# Patient Record
Sex: Female | Born: 1990 | Race: Black or African American | Hispanic: No | Marital: Single | Smoking: Current some day smoker
Health system: Southern US, Community
[De-identification: ages and names within clinical notes are randomized; demographics above are authoritative.]

## PROBLEM LIST (undated history)

## (undated) DIAGNOSIS — O139 Gestational [pregnancy-induced] hypertension without significant proteinuria, unspecified trimester: Secondary | ICD-10-CM

## (undated) DIAGNOSIS — D649 Anemia, unspecified: Secondary | ICD-10-CM

## (undated) DIAGNOSIS — J45909 Unspecified asthma, uncomplicated: Secondary | ICD-10-CM

## (undated) DIAGNOSIS — N390 Urinary tract infection, site not specified: Secondary | ICD-10-CM

## (undated) DIAGNOSIS — Z5189 Encounter for other specified aftercare: Secondary | ICD-10-CM

## (undated) DIAGNOSIS — F319 Bipolar disorder, unspecified: Secondary | ICD-10-CM

## (undated) DIAGNOSIS — R87629 Unspecified abnormal cytological findings in specimens from vagina: Secondary | ICD-10-CM

## (undated) DIAGNOSIS — G935 Compression of brain: Secondary | ICD-10-CM

## (undated) DIAGNOSIS — D573 Sickle-cell trait: Secondary | ICD-10-CM

## (undated) DIAGNOSIS — R011 Cardiac murmur, unspecified: Secondary | ICD-10-CM

## (undated) DIAGNOSIS — Z8759 Personal history of other complications of pregnancy, childbirth and the puerperium: Secondary | ICD-10-CM

## (undated) HISTORY — PX: CERVICAL BIOPSY  W/ LOOP ELECTRODE EXCISION: SUR135

## (undated) HISTORY — DX: Gestational (pregnancy-induced) hypertension without significant proteinuria, unspecified trimester: O13.9

## (undated) HISTORY — DX: Unspecified abnormal cytological findings in specimens from vagina: R87.629

## (undated) HISTORY — PX: DILATION AND CURETTAGE OF UTERUS: SHX78

## (undated) HISTORY — PX: CERVICAL CERCLAGE: SHX1329

---

## 2012-11-23 DIAGNOSIS — O1490 Unspecified pre-eclampsia, unspecified trimester: Secondary | ICD-10-CM

## 2012-11-23 DIAGNOSIS — Z9889 Other specified postprocedural states: Secondary | ICD-10-CM

## 2019-03-14 DIAGNOSIS — Z86711 Personal history of pulmonary embolism: Secondary | ICD-10-CM

## 2019-03-14 HISTORY — DX: Personal history of pulmonary embolism: Z86.711

## 2019-05-23 DIAGNOSIS — I10 Essential (primary) hypertension: Secondary | ICD-10-CM | POA: Insufficient documentation

## 2019-07-03 DIAGNOSIS — D509 Iron deficiency anemia, unspecified: Secondary | ICD-10-CM | POA: Insufficient documentation

## 2019-08-13 DIAGNOSIS — O3430 Maternal care for cervical incompetence, unspecified trimester: Secondary | ICD-10-CM

## 2019-08-13 DIAGNOSIS — O1490 Unspecified pre-eclampsia, unspecified trimester: Secondary | ICD-10-CM

## 2019-08-15 DIAGNOSIS — O339 Maternal care for disproportion, unspecified: Secondary | ICD-10-CM | POA: Insufficient documentation

## 2021-03-22 ENCOUNTER — Encounter (HOSPITAL_COMMUNITY): Payer: Self-pay

## 2021-03-22 ENCOUNTER — Other Ambulatory Visit: Payer: Self-pay

## 2021-03-22 ENCOUNTER — Emergency Department (HOSPITAL_COMMUNITY)
Admission: EM | Admit: 2021-03-22 | Discharge: 2021-03-22 | Discharge status: Against Medical Advice | Payer: No Typology Code available for payment source | Attending: Physician Assistant | Admitting: Physician Assistant

## 2021-03-22 DIAGNOSIS — N9489 Other specified conditions associated with female genital organs and menstrual cycle: Secondary | ICD-10-CM | POA: Insufficient documentation

## 2021-03-22 DIAGNOSIS — N938 Other specified abnormal uterine and vaginal bleeding: Secondary | ICD-10-CM | POA: Diagnosis present

## 2021-03-22 DIAGNOSIS — Z5321 Procedure and treatment not carried out due to patient leaving prior to being seen by health care provider: Secondary | ICD-10-CM | POA: Insufficient documentation

## 2021-03-22 HISTORY — DX: Bipolar disorder, unspecified: F31.9

## 2021-03-22 LAB — I-STAT BETA HCG BLOOD, ED (MC, WL, AP ONLY): I-stat hCG, quantitative: <5 m[IU]/mL (ref <5)

## 2021-03-22 NOTE — ED Triage Notes (Signed)
Pt arrives POV for eval of vaginal bleeding w/ clots onset yesterday. Pt reports LMP 11/17, +UPT at [facility] in December, scheduled for abortion d/t taking depakote. States started w/ cramping, vag bleeding yesterday.

## 2021-03-22 NOTE — ED Provider Triage Note (Signed)
Emergency Medicine Provider Triage Evaluation Note  Rosealee Recinos , a 31 y.o. female  was evaluated in triage.  Pt complains of vaginal bleeding. Reports positive preg test at home in December. Sent here from [place] to r/o ectopic.  Review of Systems  Positive: Vaginal bleeding, pelvic cramping Negative: fever  Physical Exam  BP (!) 144/87 (BP Location: Left Arm)    Pulse 72    Temp 99.7 F (37.6 C) (Oral)    Resp 16    SpO2 100%  Gen:   Awake, no distress   Resp:  Normal effort  MSK:   Moves extremities without difficulty  Other:  No pelvic TTP  Medical Decision Making  Medically screening exam initiated at 4:00 PM.  Appropriate orders placed.  Ronesha Heenan was informed that the remainder of the evaluation will be completed by another provider, this initial triage assessment does not replace that evaluation, and the importance of remaining in the ED until their evaluation is complete.     Karrie Meres, PA-C 03/22/21 1605

## 2021-03-22 NOTE — ED Notes (Signed)
iStat bhCG was <5.0 Triage RN made aware.   iStats not currently crossing over.

## 2021-03-22 NOTE — ED Notes (Signed)
Pt stated that she was unhappy with how she was being treated here and does not want to wait 17 hours to be seen. Pt decided to leave.

## 2021-04-08 LAB — OB RESULTS CONSOLE HGB/HCT, BLOOD
HCT: 41 (ref 29–41)
Hemoglobin: 5.3

## 2021-04-08 LAB — OB RESULTS CONSOLE PLATELET COUNT: Platelets: 369

## 2021-05-02 ENCOUNTER — Encounter: Payer: Self-pay | Admitting: Emergency Medicine

## 2021-05-02 ENCOUNTER — Ambulatory Visit
Admission: EM | Admit: 2021-05-02 | Discharge: 2021-05-02 | Disposition: A | Discharge status: Home / Self Care | Payer: No Typology Code available for payment source

## 2021-05-02 ENCOUNTER — Other Ambulatory Visit: Payer: Self-pay

## 2021-05-02 ENCOUNTER — Ambulatory Visit (INDEPENDENT_AMBULATORY_CARE_PROVIDER_SITE_OTHER): Payer: No Typology Code available for payment source

## 2021-05-02 DIAGNOSIS — J069 Acute upper respiratory infection, unspecified: Secondary | ICD-10-CM | POA: Diagnosis not present

## 2021-05-02 DIAGNOSIS — R059 Cough, unspecified: Secondary | ICD-10-CM

## 2021-05-02 DIAGNOSIS — R051 Acute cough: Secondary | ICD-10-CM

## 2021-05-02 MED ORDER — BENZONATATE 100 MG PO CAPS: 100.0000 mg | ORAL_CAPSULE | Freq: Three times a day (TID) | ORAL | 0 refills | Status: DC | PRN

## 2021-05-02 MED ORDER — PREDNISONE 10 MG (21) PO TBPK: ORAL_TABLET | Freq: Every day | ORAL | 0 refills | Status: DC

## 2021-05-02 MED ORDER — AZITHROMYCIN 500 MG PO TABS: ORAL_TABLET | ORAL | 0 refills | Status: AC

## 2021-05-02 NOTE — ED Triage Notes (Signed)
Patient c/o productive cough x 2 weeks, congestion, some chest discomfort.  Patient concern for pneumonia.

## 2021-05-02 NOTE — Discharge Instructions (Signed)
Your chest x-ray was normal.  You have been prescribed 3 medications to alleviate symptoms.  Please follow-up if symptoms persist or worsen.

## 2021-05-02 NOTE — ED Provider Notes (Signed)
[facility]    CSN: [id number] Arrival date & time: 05/02/21  1013      History   Chief Complaint Chief Complaint  Patient presents with   Cough    HPI Kimberly Stark is a 31 y.o. female.   Patient presents with 2-week history of productive cough and nasal congestion.  Patient does reports that she has a history of asthma and has been having some intermittent shortness of breath.  Denies chest pain, sore throat, ear pain, nausea, vomiting, diarrhea, abdominal pain.  She has been having to use her albuterol inhaler more often while being sick.  Denies any known sick contacts or fevers.  Patient has taken over-the-counter cough cold medication with minimal improvement in symptoms.   Cough  Past Medical History:  Diagnosis Date   Bipolar 1 disorder (HCC)     There are no problems to display for this patient.   History reviewed. No pertinent surgical history.  OB History   No obstetric history on file.      Home Medications    Prior to Admission medications   Medication Sig Start Date End Date Taking? Authorizing Provider  azithromycin (ZITHROMAX) 500 MG tablet Take 1 tablet (500 mg total) by mouth daily for 1 day, THEN 0.5 tablets (250 mg total) daily for 4 days. 05/02/21 05/07/21 Yes Indy Prestwood, Acie Fredrickson, FNP  benzonatate (TESSALON) 100 MG capsule Take 1 capsule (100 mg total) by mouth every 8 (eight) hours as needed for cough. 05/02/21  Yes Victormanuel Mclure, Rolly Salter E, FNP  Cholecalciferol (VITAMIN D3 PO) Take by mouth.   Yes [provider]  Divalproex Sodium (DEPAKOTE PO) Take by mouth.   Yes [provider]  ferrous sulfate 325 (65 FE) MG EC tablet Take 325 mg by mouth 3 (three) times daily with meals.   Yes [provider]  predniSONE (STERAPRED UNI-PAK 21 TAB) 10 MG (21) TBPK tablet Take by mouth daily. Take 6 tabs by mouth daily  for 2 days, then 5 tabs for 2 days, then 4 tabs for 2 days, then 3 tabs for 2 days, 2 tabs for 2 days, then 1 tab by  mouth daily for 2 days 05/02/21  Yes Ambre Kobayashi, Acie Fredrickson, FNP    Family History Family History  Problem Relation Age of Onset   Healthy Mother     Social History Social History   Tobacco Use   Smoking status: Every Day    Packs/day: 0.50    Types: Cigarettes   Smokeless tobacco: Never  Substance Use Topics   Alcohol use: Not Currently   Drug use: Not Currently     Allergies   Iodine, Latex, Penicillins, and Shellfish allergy   Review of Systems Review of Systems Per HPI  Physical Exam Triage Vital Signs ED Triage Vitals  Enc Vitals Group     BP 05/02/21 1055 118/72     Pulse Rate 05/02/21 1055 69     Resp 05/02/21 1055 18     Temp 05/02/21 1055 98.2 F (36.8 C)     Temp Source 05/02/21 1055 Oral     SpO2 05/02/21 1055 98 %     Weight 05/02/21 1057 164 lb (74.4 kg)     Height 05/02/21 1057 5\' 4"  (1.626 m)     Head Circumference --      Peak Flow --      Pain Score 05/02/21 1056 3     Pain Loc --      Pain Edu? --  Excl. in GC? --    No data found.  Updated Vital Signs BP 118/72 (BP Location: Left Arm)    Pulse 69    Temp 98.2 F (36.8 C) (Oral)    Resp 18    Ht 5\' 4"  (1.626 m)    Wt 164 lb (74.4 kg)    LMP 04/14/2021    SpO2 98%    BMI 28.15 kg/m   Visual Acuity Right Eye Distance:   Left Eye Distance:   Bilateral Distance:    Right Eye Near:   Left Eye Near:    Bilateral Near:     Physical Exam Constitutional:      General: She is not in acute distress.    Appearance: Normal appearance. She is not toxic-appearing or diaphoretic.  HENT:     Head: Normocephalic and atraumatic.     Right Ear: Tympanic membrane and ear canal normal.     Left Ear: Tympanic membrane and ear canal normal.     Nose: Congestion present.     Mouth/Throat:     Mouth: Mucous membranes are moist.     Pharynx: No posterior oropharyngeal erythema.  Eyes:     Extraocular Movements: Extraocular movements intact.     Conjunctiva/sclera: Conjunctivae normal.     Pupils:  Pupils are equal, round, and reactive to light.  Cardiovascular:     Rate and Rhythm: Normal rate and regular rhythm.     Pulses: Normal pulses.     Heart sounds: Normal heart sounds.  Pulmonary:     Effort: Pulmonary effort is normal. No respiratory distress.     Breath sounds: Normal breath sounds. No stridor. No wheezing, rhonchi or rales.  Abdominal:     General: Abdomen is flat. Bowel sounds are normal.     Palpations: Abdomen is soft.  Musculoskeletal:        General: Normal range of motion.     Cervical back: Normal range of motion.  Skin:    General: Skin is warm and dry.  Neurological:     General: No focal deficit present.     Mental Status: She is alert and oriented to person, place, and time. Mental status is at baseline.  Psychiatric:        Mood and Affect: Mood normal.        Behavior: Behavior normal.     UC Treatments / Results  Labs (all labs ordered are listed, but only abnormal results are displayed) Labs Reviewed - No data to display  EKG   Radiology DG Chest 2 View  Result Date: 05/02/2021 CLINICAL DATA:  Productive cough for 2 weeks EXAM: CHEST - 2 VIEW COMPARISON:  None. FINDINGS: The heart size and mediastinal contours are within normal limits. Both lungs are clear. The visualized skeletal structures are unremarkable. IMPRESSION: No active cardiopulmonary disease. Electronically Signed   By: 05/04/2021 D.O.   On: 05/02/2021 11:53    Procedures Procedures (including critical care time)  Medications Ordered in UC Medications - No data to display  Initial Impression / Assessment and Plan / UC Course  I have reviewed the triage vital signs and the nursing notes.  Pertinent labs & imaging results that were available during my care of the patient were reviewed by me and considered in my medical decision making (see chart for details).     Chest x-ray was negative for any acute cardiopulmonary process.  Will cover with azithromycin for  atypicals.  Prednisone prescribed to help alleviate inflammation  associated with possible asthma flareup.  Benzonatate prescribed to take as needed for cough.  Do not think that viral testing is necessary given length of symptoms.  Discussed return precautions.  Patient verbalized understanding and was agreeable with plan. Final Clinical Impressions(s) / UC Diagnoses   Final diagnoses:  Acute upper respiratory infection  Acute cough     Discharge Instructions      Your chest x-ray was normal.  You have been prescribed 3 medications to alleviate symptoms.  Please follow-up if symptoms persist or worsen.     ED Prescriptions     Medication Sig Dispense Auth. Provider   azithromycin (ZITHROMAX) 500 MG tablet Take 1 tablet (500 mg total) by mouth daily for 1 day, THEN 0.5 tablets (250 mg total) daily for 4 days. 3 tablet [place], [place] E, [place]   predniSONE (STERAPRED UNI-PAK 21 TAB) 10 MG (21) TBPK tablet Take by mouth daily. Take 6 tabs by mouth daily  for 2 days, then 5 tabs for 2 days, then 4 tabs for 2 days, then 3 tabs for 2 days, 2 tabs for 2 days, then 1 tab by mouth daily for 2 days 42 tablet Justina Bertini, [place] E, [place]   benzonatate (TESSALON) 100 MG capsule Take 1 capsule (100 mg total) by mouth every 8 (eight) hours as needed for cough. 21 capsule [place], Acie Fredrickson, [place]      PDMP not reviewed this encounter.   Gustavus Bryant, [place] 05/02/21 [phone]

## 2021-05-25 ENCOUNTER — Telehealth: Payer: Self-pay | Admitting: Family Medicine

## 2021-05-25 NOTE — Telephone Encounter (Signed)
Patient was referred and I use phone number in referral packet to reach out. Was unable to get in contact, left voicemail to call back and schedule. ?

## 2021-06-09 ENCOUNTER — Telehealth (INDEPENDENT_AMBULATORY_CARE_PROVIDER_SITE_OTHER): Payer: No Typology Code available for payment source

## 2021-06-09 DIAGNOSIS — O09899 Supervision of other high risk pregnancies, unspecified trimester: Secondary | ICD-10-CM

## 2021-06-09 DIAGNOSIS — O09299 Supervision of pregnancy with other poor reproductive or obstetric history, unspecified trimester: Secondary | ICD-10-CM | POA: Insufficient documentation

## 2021-06-09 DIAGNOSIS — O0940 Supervision of pregnancy with grand multiparity, unspecified trimester: Secondary | ICD-10-CM

## 2021-06-09 DIAGNOSIS — O1490 Unspecified pre-eclampsia, unspecified trimester: Secondary | ICD-10-CM | POA: Insufficient documentation

## 2021-06-09 DIAGNOSIS — O0990 Supervision of high risk pregnancy, unspecified, unspecified trimester: Secondary | ICD-10-CM | POA: Insufficient documentation

## 2021-06-09 DIAGNOSIS — Z9889 Other specified postprocedural states: Secondary | ICD-10-CM | POA: Insufficient documentation

## 2021-06-09 DIAGNOSIS — O09293 Supervision of pregnancy with other poor reproductive or obstetric history, third trimester: Secondary | ICD-10-CM | POA: Insufficient documentation

## 2021-06-09 DIAGNOSIS — Z3A00 Weeks of gestation of pregnancy not specified: Secondary | ICD-10-CM

## 2021-06-09 NOTE — Patient Instructions (Signed)

## 2021-06-09 NOTE — Progress Notes (Signed)
New OB Intake ? ?I connected with  [name] on 06/09/21 at  1:15 PM EDT by MyChart Video Visit and verified that I am speaking with the correct person using two identifiers. Nurse is located at [facility] and pt is located at [email or web address]. ? ?I discussed the limitations, risks, security and privacy concerns of performing an evaluation and management service by telephone and the availability of in person appointments. I also discussed with the patient that there may be a patient responsible charge related to this service. The patient expressed understanding and agreed to proceed. ? ?I explained I am completing New OB Intake today. We discussed her EDD of 12/27/21 that is based on LMP of 03/22/21. Pt is G11/P2. I reviewed her allergies, medications, Medical/Surgical/OB history, and appropriate screenings. I informed her of [facility] services. Based on history, this is a/an  pregnancy complicated by pre-eclampsia .  ? ?Patient Active Problem List  ? Diagnosis Date Noted  ? Preeclampsia 06/09/2021  ? Supervision of high risk pregnancy, antepartum 06/09/2021  ? History of cervical cerclage 06/09/2021  ? ? ?Concerns addressed today ? ?Delivery Plans:  ?Plans to deliver at [facility].  ? ?MyChart/Babyscripts ?MyChart access verified. I explained pt will have some visits in office and some virtually. Babyscripts instructions given and order placed. Patient verifies receipt of registration text/e-mail. Account successfully created and app downloaded. ? ?Blood Pressure Cuff  ?Pt has her own BP Cuff, Explained after first prenatal appt pt will check weekly and document in Babyscripts. ? ?Weight scale: Patient does / does not  have weight scale. Weight scale ordered for patient to pick up from [facility].  ? ?Anatomy US ?Explained first scheduled [place] will be around 19 weeks. Anatomy US scheduled for 08/02/21 at 0945. Pt notified to arrive at 0930. ?Scheduled AFP lab only appointment if CenteringPregnancy pt for same day as anatomy US.   ? ?Labs ?Discussed Avelina Laine genetic screening with patient. Would like both Panorama and Horizon drawn at new OB visit.Also if interested in genetic testing, tell patient she will need AFP 15-21 weeks to complete genetic testing .Routine prenatal labs needed. ? ?Covid Vaccine ?Patient has covid vaccine.  ? ?Is patient a CenteringPregnancy candidate? Not a candidate  "Centering Patient" indicated on sticky note ?  ?Is patient a Mom+Baby Combined Care candidate? Not a candidate   Scheduled with Mom+Baby provider  ?  ?Is patient interested in [place]? No  "Interested in [employer] - Schedule next visit with CNM" on sticky note ? ?Informed patient of [facility] website  and placed link in her AVS.  ? ?Social Determinants of Health ?Food Insecurity: Patient denies food insecurity. ?WIC Referral: Patient is interested in referral to [facility].  ?Transportation: Patient denies transportation needs. ?Childcare: Discussed no children allowed at ultrasound appointments. Offered childcare services; patient declines childcare services at this time. ? ?Send link to Pregnancy Navigators ? ? ?Placed OB Box on problem list and updated ? ?First visit review ?I reviewed new OB appt with pt. I explained she will have a pelvic exam, ob bloodwork with genetic screening, and PAP smear. Explained pt will be seen by Dr. Para March at first visit; encounter routed to appropriate provider. Explained that patient will be seen by pregnancy navigator following visit with provider. [facility] information placed in AVS.  ? ?Henrietta Dine, CMA ?06/09/2021  2:16 PM  ?

## 2021-06-20 NOTE — Progress Notes (Signed)
? ?History:  ? Kimberly Stark is a 31 y.o. [id number] at 7164w1d by LMP being seen today for her first obstetrical visit.    ? ?Patient does intend to breast feed.  ? ?Pregnancy history fully reviewed. Obstetrical history is significant for:  ?History of preterm birth and cerclage. She had a history of 2 LEEP procedures - one in 2008 and one in 2012. She was then pregnant and bleeding and went in for evaluation and had been told she was dilated. She had a rescue cerclage placed and ultimately was delivered at 30w for PreE.  ?She also has history of Preeclampsia at 30wks in her pregnancy in 2014 and was induced. Her baby was "less than 6lbs". She also developed likely HELLP syndrome with her second pregnancy.  ?She had a c-section in 2021 because she was pushing and could not get the baby out and they were concerned she was developing HELLP Syndrome.  ?H/o PE? She is not sure but in 2021 she was hospitalized with COVID during her pregnancy and subsequently placed on Lovenox BID for the duration of her pregnancy. She is unsure where she had clots but was told she has them.  ?She has a history of RPL - she was going to start work up for APS and decided not to. Her mom DOES have a history of APS.  ? ?Patient reports no complaints. ?  ? ?  ?HISTORY: ?OB History  ?Gravida Para Term Preterm AB Living  ?11 1 0 1 8 2   ?SAB IAB Ectopic Multiple Live Births  ?7 1 0 0 2  ?  ?# Outcome Date GA Lbr Len/2nd Weight Sex Delivery Anes PTL Lv  ?11 Current           ?10 Gravida 08/13/19   9 lb 5.1 oz (4.227 kg)  CS-LTranv  Y LIV  ?   Complications: Preeclampsia, Cervical cerclage suture present, unspecified trimester  ?9 Preterm 11/23/12 7848w0d  5 lb 8.2 oz (2.5 kg)  Vag-Spont   LIV  ?   Complications: Preeclampsia, History of cervical cerclage  ?8 IAB           ?7 SAB           ?6 SAB           ?5 SAB           ?4 SAB           ?3 SAB           ?2 SAB           ?1 SAB           ?  ? ?No results found for: DIAGPAP, HPV, HPVHIGH ? ? ?Past  Medical History:  ?Diagnosis Date  ? Bipolar 1 disorder (HCC)   ? Pregnancy induced hypertension   ? Preterm labor   ? ?Past Surgical History:  ?Procedure Laterality Date  ? DILATION AND CURETTAGE OF UTERUS    ? ?Family History  ?Problem Relation Age of Onset  ? Hypertension Mother   ? Diabetes Mother   ? Healthy Mother   ? Diabetes Father   ? Cancer Maternal Grandmother   ? Diabetes Paternal Grandmother   ? ?Social History  ? ?Tobacco Use  ? Smoking status: Every Day  ?  Packs/day: 0.50  ?  Types: Cigarettes  ? Smokeless tobacco: Never  ?Substance Use Topics  ? Alcohol use: Not Currently  ? Drug use: Not Currently  ? ?Allergies  ?Allergen Reactions  ?  Iodine   ? Latex   ? Penicillins   ? Shellfish Allergy   ? ?Current Outpatient Medications on File Prior to Visit  ?Medication Sig Dispense Refill  ? Cholecalciferol (VITAMIN D3 PO) Take by mouth.    ? ferrous sulfate 325 (65 FE) MG EC tablet Take 325 mg by mouth 3 (three) times daily with meals.    ? Prenatal MV & Min w/FA-DHA (PRENATAL GUMMIES PO) Take by mouth.    ? SERTRALINE HCL PO Take 100 mg by mouth daily.    ? ?No current facility-administered medications on file prior to visit.  ? ? ?Review of Systems ?Pertinent items noted in HPI and remainder of comprehensive ROS otherwise negative. ? ?Physical Exam:  ? ?Vitals:  ? 06/22/21 1533  ?BP: 135/74  ?Pulse: 70  ?Weight: 178 lb (80.7 kg)  ? ?Fetal Heart Rate (bpm): 166 ?General: well-developed, well-nourished female in no acute distress  ?Breasts:  Deferred  ?Skin: normal coloration and turgor, no rashes  ?Neurologic: oriented, normal, negative, normal mood  ?Extremities: normal strength, tone, and muscle mass, ROM of all joints is normal  ?HEENT PERRLA, extraocular movement intact and sclera clear, anicteric  ?Neck supple and no masses  ?Cardiovascular: regular rate and rhythm  ?Respiratory:  no respiratory distress, normal breath sounds  ?Abdomen: soft, non-tender; bowel sounds normal; no masses,  no  organomegaly  ?   ?  ?Assessment:  ?  ?Pregnancy: [id number] ?Patient Active Problem List  ? Diagnosis Date Noted  ? History of pulmonary embolus (PE) 06/22/2021  ? History of macrosomia in infant in prior pregnancy, currently pregnant 06/22/2021  ? Tobacco abuse 06/22/2021  ? History of C-section 06/22/2021  ? Hx of preeclampsia, prior pregnancy, currently pregnant 06/09/2021  ? Supervision of high risk pregnancy, antepartum 06/09/2021  ? History of cervical cerclage 06/09/2021  ? ?  ?Plan:  ?  ?1. Supervision of high risk pregnancy, antepartum ?Initial labs drawn. ?Continue prenatal vitamins. ?Problem list reviewed and updated. ?Genetic Screening discussed, NIPS: requested - will confirm EDC and then bring her back for this.  ?Ultrasound discussed; fetal anatomic survey: ordered. ?Anticipatory guidance about prenatal visits given including labs, ultrasounds, and testing. ?Discussed usage of Babyscripts and virtual visits ?Bedside US shows likely earlier gestation than LMP suggests. Will send for f/u tomorrow.  ?Obtain pap from [place].  ? ?2. History of cervical cerclage ?- We discussed history indicated cerclage vs rescue cerclage. She had a very bad experience with her last pregnancy (delivered at term) and would like to not have a cerclage prophylactically. She would like to do serial cervical lengths. We discussed these would start at 16 weeks and go Q2 weeks.  ?- Discussed option for  ? ?3. Hx of preeclampsia, prior pregnancy, currently pregnant ?Check baseline labs ?Recommended ldASA ?BP normal today ? ?4. H/o c-section ?- Desires TOLAC ? ?5. Tobacco abuse ?- Reviewed interest in cessation - pt is weaning down. She has patches from the [place] ? ?6. H/o PE in prior pregnancy in s/o COVID ?Would do APS workup after pregnancy (RPL, preterm PreE, h/o HELLP syndrome) ?Discussed Lovenox - reviewed ACOG and we will do 40 daily once viability and dates confirmed. She will also be doing ldASA due to h/o preE ? ?7. H/o  Macrosomia ?- A1C added to prenatal labs.  ? ?The nature of [place] [facility] Healthcare/Faculty Practice with multiple MDs and Advanced Practice Providers was explained to patient; also emphasized that residents, students are part of our  team. ?Routine obstetric precautions reviewed. Encouraged to seek out care at office or emergency room [facility] preferred) for urgent and/or emergent concerns. ?Return in about 4 weeks (around 07/20/2021) for OB VISIT, MD or APP.  ?  ?Milas Hock, MD, FACOG ?Obstetrician Heritage manager, Faculty Practice ?Center for [employer], [facility] ? ?

## 2021-06-22 ENCOUNTER — Ambulatory Visit (INDEPENDENT_AMBULATORY_CARE_PROVIDER_SITE_OTHER): Payer: No Typology Code available for payment source | Admitting: Obstetrics and Gynecology

## 2021-06-22 ENCOUNTER — Encounter: Payer: Self-pay | Admitting: Obstetrics and Gynecology

## 2021-06-22 VITALS — BP 135/74 | HR 70 | Wt 178.0 lb

## 2021-06-22 DIAGNOSIS — Z86711 Personal history of pulmonary embolism: Secondary | ICD-10-CM | POA: Diagnosis not present

## 2021-06-22 DIAGNOSIS — Z9889 Other specified postprocedural states: Secondary | ICD-10-CM

## 2021-06-22 DIAGNOSIS — O0990 Supervision of high risk pregnancy, unspecified, unspecified trimester: Secondary | ICD-10-CM | POA: Diagnosis not present

## 2021-06-22 DIAGNOSIS — O09299 Supervision of pregnancy with other poor reproductive or obstetric history, unspecified trimester: Secondary | ICD-10-CM

## 2021-06-22 DIAGNOSIS — Z98891 History of uterine scar from previous surgery: Secondary | ICD-10-CM

## 2021-06-22 DIAGNOSIS — Z720 Tobacco use: Secondary | ICD-10-CM | POA: Insufficient documentation

## 2021-06-22 MED ORDER — ASPIRIN EC 81 MG PO TBEC: 81.0000 mg | DELAYED_RELEASE_TABLET | Freq: Every day | ORAL | 11 refills | Status: DC

## 2021-06-23 ENCOUNTER — Ambulatory Visit (HOSPITAL_COMMUNITY): Admission: RE | Admit: 2021-06-23 | Payer: No Typology Code available for payment source | Source: Ambulatory Visit

## 2021-06-23 LAB — CBC/D/PLT+RPR+RH+ABO+RUBIGG...
Antibody Screen: NEGATIVE
Basophils Absolute: 0.1 10*3/uL (ref 0.0–0.2)
Basos: 1 %
EOS (ABSOLUTE): 0.2 10*3/uL (ref 0.0–0.4)
Eos: 2 %
HCV Ab: NONREACTIVE
HIV Screen 4th Generation wRfx: NONREACTIVE
Hematocrit: 39.7 % (ref 34.0–46.6)
Hemoglobin: 13.7 g/dL (ref 11.1–15.9)
Hepatitis B Surface Ag: NEGATIVE
Immature Grans (Abs): 0.1 10*3/uL (ref 0.0–0.1)
Immature Granulocytes: 1 %
Lymphocytes Absolute: 2.5 10*3/uL (ref 0.7–3.1)
Lymphs: 22 %
MCH: 31 pg (ref 26.6–33.0)
MCHC: 34.5 g/dL (ref 31.5–35.7)
MCV: 90 fL (ref 79–97)
Monocytes Absolute: 0.6 10*3/uL (ref 0.1–0.9)
Monocytes: 6 %
Neutrophils Absolute: 7.6 10*3/uL — ABNORMAL HIGH (ref 1.4–7.0)
Neutrophils: 68 %
Platelets: 334 10*3/uL (ref 150–450)
RBC: 4.42 x10E6/uL (ref 3.77–5.28)
RDW: 12.8 % (ref 11.7–15.4)
RPR Ser Ql: NONREACTIVE
Rh Factor: POSITIVE
Rubella Antibodies, IGG: 6.18 index (ref >0.99)
WBC: 11 10*3/uL — ABNORMAL HIGH (ref 3.4–10.8)

## 2021-06-23 LAB — HEMOGLOBIN A1C
Est. average glucose Bld gHb Est-mCnc: 114 mg/dL
Hgb A1c MFr Bld: 5.6 % (ref 4.8–5.6)

## 2021-06-23 LAB — HCV INTERPRETATION

## 2021-06-23 NOTE — Progress Notes (Signed)
Patient had elevated Phq 9 & Gad7-- patient already has someone she sees for this ?

## 2021-06-24 LAB — COMPREHENSIVE METABOLIC PANEL
ALT: 12 IU/L (ref 0–32)
AST: 10 IU/L (ref 0–40)
Albumin/Globulin Ratio: 2 (ref 1.2–2.2)
Albumin: 4.4 g/dL (ref 3.8–4.8)
Alkaline Phosphatase: 82 IU/L (ref 44–121)
BUN/Creatinine Ratio: 12 (ref 9–23)
BUN: 7 mg/dL (ref 6–20)
Bilirubin Total: <0.2 mg/dL (ref 0.0–1.2)
CO2: 24 mmol/L (ref 20–29)
Calcium: 9.9 mg/dL (ref 8.7–10.2)
Chloride: 103 mmol/L (ref 96–106)
Creatinine, Ser: 0.57 mg/dL (ref 0.57–1.00)
Globulin, Total: 2.2 g/dL (ref 1.5–4.5)
Glucose: 86 mg/dL (ref 70–99)
Potassium: 3.9 mmol/L (ref 3.5–5.2)
Sodium: 138 mmol/L (ref 134–144)
Total Protein: 6.6 g/dL (ref 6.0–8.5)
eGFR: 123 mL/min/{1.73_m2} (ref >59)

## 2021-06-24 LAB — PROTEIN / CREATININE RATIO, URINE
Creatinine, Urine: 137.9 mg/dL
Protein, Ur: 21.4 mg/dL
Protein/Creat Ratio: 155 mg/g creat (ref 0–200)

## 2021-06-24 LAB — URINE CULTURE, OB REFLEX

## 2021-06-24 LAB — CULTURE, OB URINE

## 2021-06-28 DIAGNOSIS — O0990 Supervision of high risk pregnancy, unspecified, unspecified trimester: Secondary | ICD-10-CM

## 2021-06-30 ENCOUNTER — Ambulatory Visit
Admission: RE | Admit: 2021-06-30 | Discharge: 2021-06-30 | Disposition: A | Discharge status: Home / Self Care | Payer: No Typology Code available for payment source | Source: Ambulatory Visit | Attending: Obstetrics and Gynecology | Admitting: Obstetrics and Gynecology

## 2021-06-30 ENCOUNTER — Other Ambulatory Visit: Payer: Self-pay | Admitting: Obstetrics and Gynecology

## 2021-06-30 DIAGNOSIS — Z9889 Other specified postprocedural states: Secondary | ICD-10-CM

## 2021-07-01 ENCOUNTER — Encounter (HOSPITAL_COMMUNITY): Payer: Self-pay

## 2021-07-01 ENCOUNTER — Ambulatory Visit (HOSPITAL_COMMUNITY): Admission: RE | Admit: 2021-07-01 | Payer: No Typology Code available for payment source | Source: Ambulatory Visit

## 2021-07-01 DIAGNOSIS — O0990 Supervision of high risk pregnancy, unspecified, unspecified trimester: Secondary | ICD-10-CM

## 2021-07-04 ENCOUNTER — Telehealth: Payer: Self-pay | Admitting: General Practice

## 2021-07-04 DIAGNOSIS — O0990 Supervision of high risk pregnancy, unspecified, unspecified trimester: Secondary | ICD-10-CM

## 2021-07-04 DIAGNOSIS — Z9889 Other specified postprocedural states: Secondary | ICD-10-CM

## 2021-07-04 MED ORDER — BLOOD PRESSURE KIT DEVI: 1.0000 | Freq: Once | 0 refills | Status: AC

## 2021-07-04 NOTE — Telephone Encounter (Signed)
Scheduled appt with MFM for 5/25 @ 830.  ? ?Called patient and informed her of appt as well as reviewed due date information. Patient verbalized understanding and asked if her due date would change. Patient reports in past pregnancies her babies would be measuring big so they would adjust her due date again. Told patient we will not adjust her due date again. Reviewed accuracy of first trimester ultrasounds and we already know her LMP due date was not accurate so we will now be going by a due date of 11/8. Patient verbalized understanding. Patient states she has picked up aspirin Rx but has not started it. Patient reports concern of pre-e happening again as it happened in a previous pregnancy around 14-15 weeks. She also asked about a BP cuff as she has been getting headaches lately. Discussed beginning baby aspirin next Wednesday when she turns 13 weeks. [facility] and reviewed directions of use, when to take BP, & when to report elevated values. Discussed taking 2 extra strength tylenol for headaches, drinking plenty of water & resting for an hour when headaches occur. Recommended she call office if headache persists or is associated with elevated BP. Patient verbalized understanding. ?

## 2021-07-04 NOTE — Telephone Encounter (Signed)
-----   Message from Milas Hock, MD sent at 07/04/2021  7:52 AM EDT ----- ?She should have serial cervical lengths (history of cerclage - does not yet want a repeat cerclage). Can you schedule first cervical length based on new EDC of 11/8? ?Thanks, pad ?

## 2021-07-12 NOTE — Progress Notes (Signed)
? ?  PRENATAL VISIT NOTE ? ?Subjective:  ?Kimberly Stark is a 31 y.o. [id number] at [redacted]w[redacted]d being seen today for ongoing prenatal care.  She is currently monitored for the following issues for this high-risk pregnancy and has Hx of preeclampsia, prior pregnancy, currently pregnant; Supervision of high risk pregnancy, antepartum; History of cervical cerclage; History of pulmonary embolus (PE); History of macrosomia in infant in prior pregnancy, currently pregnant; Tobacco abuse; and History of C-section on their problem list. ? ?Patient reports heartburn.  Contractions: Not present. Vag. Bleeding: Bloody Show.  Movement: Absent. Denies leaking of fluid.  ? ?The following portions of the patient's history were reviewed and updated as appropriate: allergies, current medications, past family history, past medical history, past social history, past surgical history and problem list.  ? ?Objective:  ? ?Vitals:  ? 07/13/21 1411  ?BP: 132/71  ?Pulse: 85  ?Weight: 186 lb (84.4 kg)  ? ? ?Fetal Status:     Movement: Absent   Cardiac activity and FM visualized with bedside US ? ?General:  Alert, oriented and cooperative. Patient is in no acute distress.  ?Skin: Skin is warm and dry. No rash noted.   ?Cardiovascular: Normal heart rate noted  ?Respiratory: Normal respiratory effort, no problems with respiration noted  ?Abdomen: Soft, gravid, appropriate for gestational age.  Pain/Pressure: Absent     ?Pelvic: Cervical exam deferred        ?Extremities: Normal range of motion.     ?Mental Status: Normal mood and affect. Normal behavior. Normal judgment and thought content.  ? ?Assessment and Plan:  ?Pregnancy: [id number] at [redacted]w[redacted]d ?1. Hx of preeclampsia, prior pregnancy, currently pregnant ?ldASA, baseline labs wnl ? ?2. Supervision of high risk pregnancy, antepartum ?Genetics today ? ?3. History of cervical cerclage ?Serial CL, first on 5/25 ? ?4. History of pulmonary embolus (PE) ?Lovenox 40 sent in - discussed with pt ? ?5. Tobacco  abuse ?She is weaning; down to 1-2 cpd ? ?6. History of C-section ?Desires TOLAC. Has h/o successful vbac.  ? ?Preterm labor symptoms and general obstetric precautions including but not limited to vaginal bleeding, contractions, leaking of fluid and fetal movement were reviewed in detail with the patient. ?Please refer to After Visit Summary for other counseling recommendations.  ? ?Return for Return to office - 20 weeks , OB VISIT, MD or APP. ? ?Future Appointments  ?Date Time Provider [place]  ?08/04/2021  8:30 AM [facility] NURSE [facility]  ?08/04/2021  8:45 AM [facility] US4 [facility]  ?08/24/2021 10:30 AM [facility] NURSE [facility]  ?08/24/2021 10:45 AM [facility] US5 [facility]  ? ? ?Radene Gunning, MD ?

## 2021-07-13 ENCOUNTER — Ambulatory Visit (INDEPENDENT_AMBULATORY_CARE_PROVIDER_SITE_OTHER): Payer: No Typology Code available for payment source | Admitting: Obstetrics and Gynecology

## 2021-07-13 ENCOUNTER — Encounter: Payer: Self-pay | Admitting: Obstetrics and Gynecology

## 2021-07-13 VITALS — BP 132/71 | HR 85 | Wt 186.0 lb

## 2021-07-13 DIAGNOSIS — O09299 Supervision of pregnancy with other poor reproductive or obstetric history, unspecified trimester: Secondary | ICD-10-CM

## 2021-07-13 DIAGNOSIS — Z9889 Other specified postprocedural states: Secondary | ICD-10-CM

## 2021-07-13 DIAGNOSIS — Z98891 History of uterine scar from previous surgery: Secondary | ICD-10-CM

## 2021-07-13 DIAGNOSIS — Z86711 Personal history of pulmonary embolism: Secondary | ICD-10-CM

## 2021-07-13 DIAGNOSIS — Z720 Tobacco use: Secondary | ICD-10-CM

## 2021-07-13 DIAGNOSIS — O0990 Supervision of high risk pregnancy, unspecified, unspecified trimester: Secondary | ICD-10-CM

## 2021-07-13 MED ORDER — ENOXAPARIN SODIUM 40 MG/0.4ML IJ SOSY: 40.0000 mg | PREFILLED_SYRINGE | INTRAMUSCULAR | 8 refills | Status: DC

## 2021-07-13 MED ORDER — ONDANSETRON HCL 4 MG PO TABS: 4.0000 mg | ORAL_TABLET | Freq: Three times a day (TID) | ORAL | 0 refills | Status: DC | PRN

## 2021-07-13 MED ORDER — FAMOTIDINE 20 MG PO TABS: 20.0000 mg | ORAL_TABLET | Freq: Two times a day (BID) | ORAL | 1 refills | Status: DC

## 2021-07-13 MED ORDER — ASPIRIN EC 81 MG PO TBEC: 81.0000 mg | DELAYED_RELEASE_TABLET | Freq: Every day | ORAL | 11 refills | Status: DC

## 2021-07-27 ENCOUNTER — Encounter: Payer: Self-pay | Admitting: *Deleted

## 2021-07-29 ENCOUNTER — Encounter (HOSPITAL_COMMUNITY): Payer: Self-pay | Admitting: Obstetrics & Gynecology

## 2021-07-29 ENCOUNTER — Telehealth: Payer: Self-pay | Admitting: Lactation Services

## 2021-07-29 ENCOUNTER — Inpatient Hospital Stay (HOSPITAL_COMMUNITY)
Admission: AD | Admit: 2021-07-29 | Discharge: 2021-07-29 | Disposition: A | Discharge status: Home / Self Care | Payer: No Typology Code available for payment source | Attending: Obstetrics & Gynecology | Admitting: Obstetrics & Gynecology

## 2021-07-29 ENCOUNTER — Other Ambulatory Visit: Payer: Self-pay

## 2021-07-29 DIAGNOSIS — R1013 Epigastric pain: Secondary | ICD-10-CM | POA: Diagnosis not present

## 2021-07-29 DIAGNOSIS — O10919 Unspecified pre-existing hypertension complicating pregnancy, unspecified trimester: Secondary | ICD-10-CM | POA: Diagnosis not present

## 2021-07-29 DIAGNOSIS — O26892 Other specified pregnancy related conditions, second trimester: Secondary | ICD-10-CM | POA: Diagnosis present

## 2021-07-29 DIAGNOSIS — G44209 Tension-type headache, unspecified, not intractable: Secondary | ICD-10-CM | POA: Insufficient documentation

## 2021-07-29 DIAGNOSIS — Z3A15 15 weeks gestation of pregnancy: Secondary | ICD-10-CM | POA: Diagnosis not present

## 2021-07-29 DIAGNOSIS — O09292 Supervision of pregnancy with other poor reproductive or obstetric history, second trimester: Secondary | ICD-10-CM | POA: Diagnosis not present

## 2021-07-29 DIAGNOSIS — K219 Gastro-esophageal reflux disease without esophagitis: Secondary | ICD-10-CM | POA: Insufficient documentation

## 2021-07-29 DIAGNOSIS — O10912 Unspecified pre-existing hypertension complicating pregnancy, second trimester: Secondary | ICD-10-CM | POA: Diagnosis not present

## 2021-07-29 DIAGNOSIS — O0990 Supervision of high risk pregnancy, unspecified, unspecified trimester: Secondary | ICD-10-CM

## 2021-07-29 LAB — COMPREHENSIVE METABOLIC PANEL
ALT: 12 U/L (ref 0–44)
AST: 14 U/L — ABNORMAL LOW (ref 15–41)
Albumin: 3.3 g/dL — ABNORMAL LOW (ref 3.5–5.0)
Alkaline Phosphatase: 61 U/L (ref 38–126)
Anion gap: 7 (ref 5–15)
BUN: <5 mg/dL — ABNORMAL LOW (ref 6–20)
CO2: 24 mmol/L (ref 22–32)
Calcium: 9.4 mg/dL (ref 8.9–10.3)
Chloride: 105 mmol/L (ref 98–111)
Creatinine, Ser: 0.54 mg/dL (ref 0.44–1.00)
GFR, Estimated: >60 mL/min (ref >60)
Glucose, Bld: 89 mg/dL (ref 70–99)
Potassium: 3.9 mmol/L (ref 3.5–5.1)
Sodium: 136 mmol/L (ref 135–145)
Total Bilirubin: 0.3 mg/dL (ref 0.3–1.2)
Total Protein: 6.2 g/dL — ABNORMAL LOW (ref 6.5–8.1)

## 2021-07-29 LAB — URINALYSIS, ROUTINE W REFLEX MICROSCOPIC
Bilirubin Urine: NEGATIVE
Glucose, UA: NEGATIVE mg/dL
Hgb urine dipstick: NEGATIVE
Ketones, ur: NEGATIVE mg/dL
Leukocytes,Ua: NEGATIVE
Nitrite: NEGATIVE
Protein, ur: NEGATIVE mg/dL
Specific Gravity, Urine: <1.005 — ABNORMAL LOW (ref 1.005–1.030)
pH: 7 (ref 5.0–8.0)

## 2021-07-29 LAB — CBC
HCT: 36.3 % (ref 36.0–46.0)
Hemoglobin: 12.5 g/dL (ref 12.0–15.0)
MCH: 31.3 pg (ref 26.0–34.0)
MCHC: 34.4 g/dL (ref 30.0–36.0)
MCV: 90.8 fL (ref 80.0–100.0)
Platelets: 312 10*3/uL (ref 150–400)
RBC: 4 MIL/uL (ref 3.87–5.11)
RDW: 12.2 % (ref 11.5–15.5)
WBC: 11 10*3/uL — ABNORMAL HIGH (ref 4.0–10.5)
nRBC: 0 % (ref 0.0–0.2)

## 2021-07-29 LAB — PROTEIN / CREATININE RATIO, URINE
Creatinine, Urine: 19.25 mg/dL
Total Protein, Urine: <6 mg/dL

## 2021-07-29 MED ORDER — BUTALBITAL-APAP-CAFFEINE 50-325-40 MG PO TABS
2.0000 | ORAL_TABLET | Freq: Once | ORAL | Status: AC
  Administered 2021-07-29: 2 via ORAL
  Filled 2021-07-29: qty 2

## 2021-07-29 MED ORDER — ALUM & MAG HYDROXIDE-SIMETH 200-200-20 MG/5ML PO SUSP
30.0000 mL | Freq: Once | ORAL | Status: AC
  Administered 2021-07-29: 30 mL via ORAL
  Filled 2021-07-29: qty 30

## 2021-07-29 MED ORDER — NIFEDIPINE ER OSMOTIC RELEASE 30 MG PO TB24
30.0000 mg | ORAL_TABLET | Freq: Once | ORAL | Status: AC
  Administered 2021-07-29: 30 mg via ORAL
  Filled 2021-07-29: qty 1

## 2021-07-29 MED ORDER — NIFEDIPINE ER OSMOTIC RELEASE 30 MG PO TB24: 30.0000 mg | ORAL_TABLET | Freq: Every day | ORAL | 2 refills | Status: DC

## 2021-07-29 NOTE — Telephone Encounter (Signed)
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 She reports she has had testing x 2 and this did not show up, reviewed testing can change over time.   Advised mom to call Johnsie Cancel at [phone] to schedule a telephone [facility] Counseling Session to discuss resutls.   Reviewed it is recommended that FOB be tested and [facility] can send a saliva kit or can pick up in the office at her next visit. Patient voiced understanding.   BP 145/92 this morning when awakened. Patient reports she was laying down when taking. She reports she did have a headache and after she ate the headache decreased to a lesser level. Patient reports she took her Lovenox and her Aspirin and laid back down. She is having a burning pain in chest area throughout the day on a off or the last few weeks. She then sometimes gets nauseaus and will throw up. She has Zofran and will still throw.   She is not able to take her BP as she is at work and does not have cuff with her. She does not take an BP medications.   Reviewed that it is recommended that she retake her BP and take 2 extra Strength Tylenol. Reviewed if BP remains > 140/90 and accompanied by headache that is not relieved by Tylenol, blurred vision or dizziness that she is to go to [facility] at the [facility] [place] [facility] for evaluation. Patient voiced understanding.

## 2021-07-29 NOTE — MAU Note (Signed)
Kimberly Stark is a 31 y.o. at [redacted]w[redacted]d here in MAU reporting: monitoring BP at home via babyscripts. Reading today was high and she has a hx of pre-e. States she has a headache and epigastric pain.   Onset of complaint: ongoing  Pain score: 6/10  Vitals:   07/29/21 1123  BP: (!) 173/72  Pulse: 83  Resp: 16  Temp: 99.2 F (37.3 C)  SpO2: 97%     QJJ:HERDEYCX in triage, will obtain in the room, pt wearing a belly band  Lab orders placed from triage: UA

## 2021-07-29 NOTE — MAU Provider Note (Signed)
History     CSN: [id number]  Arrival date and time: 07/29/21 1105   Event Date/Time   First Provider Initiated Contact with Patient 07/29/21 1216      Chief Complaint  Patient presents with   Headache   Hypertension   Abdominal Pain   Kimberly Stark is a 31 y.o. [id number] at [redacted]w[redacted]d who receives care at [facility].  She presents today for Headache, Hypertension, and Abdominal Pain.  She reports history of PreEclampsia in early pregnancy and expresses concern that it is again presenting itself.  Patient reports her blood pressure has been elevated for "a few days."  She states her blood pressures would improve with sleep as well as the headache she has been experiencing.  She states the headache has been constant since 9 PM yesterday and has not been relieved by her usual interventions eating, resting, and hydrating.  She states she has also taken Tylenol with no improvement in the pain which she rates a 6-7/10.  She states the headache is located in her left side temporal area and she describes it as pressure.  States at times the headaches are so bad they have been causing her to "pass out."  Patient also reports pain in her epigastric area that has been unrelieved with her once daily Pepcid.  She states she has not taken her Lovenox in 2 days.        OB History     Gravida  11   Para  2   Term  0   Preterm  2   AB  8   Living  2      SAB  7   IAB  1   Ectopic  0   Multiple  0   Live Births  2           Past Medical History:  Diagnosis Date   Bipolar 1 disorder ([place])    Pregnancy induced hypertension    Preterm labor     Past Surgical History:  Procedure Laterality Date   CERVICAL BIOPSY  W/ LOOP ELECTRODE EXCISION     CERVICAL CERCLAGE     CESAREAN SECTION     DILATION AND CURETTAGE OF UTERUS      Family History  Problem Relation Age of Onset   Hypertension Mother    Diabetes Mother    Healthy Mother    Diabetes Father    Cancer Maternal  Grandmother    Diabetes Paternal Grandmother     Social History   Tobacco Use   Smoking status: Every Day    Packs/day: 0.50    Types: Cigarettes   Smokeless tobacco: Never  Substance Use Topics   Alcohol use: Not Currently   Drug use: Not Currently    Allergies:  Allergies  Allergen Reactions   Iodine    Latex    Penicillins    Shellfish Allergy     Medications Prior to Admission  Medication Sig Dispense Refill Last Dose   aspirin EC 81 MG tablet Take 1 tablet (81 mg total) by mouth daily. Swallow whole. 30 tablet 11 07/29/2021   Cholecalciferol (VITAMIN D3 PO) Take by mouth.   07/29/2021   enoxaparin (LOVENOX) 40 MG/0.4ML injection Inject 0.4 mLs (40 mg total) into the skin daily. 12 mL 8 Past Week   famotidine (PEPCID) 20 MG tablet Take 1 tablet (20 mg total) by mouth 2 (two) times daily. 30 tablet 1 07/29/2021   ferrous sulfate 325 (65 FE) MG EC tablet  Take 325 mg by mouth 3 (three) times daily with meals.   07/29/2021   ondansetron (ZOFRAN) 4 MG tablet Take 1 tablet (4 mg total) by mouth every 8 (eight) hours as needed for nausea or vomiting. 20 tablet 0 07/29/2021   Prenatal MV & Min w/FA-DHA (PRENATAL GUMMIES PO) Take by mouth.   07/29/2021   SERTRALINE HCL PO Take 100 mg by mouth daily. (Patient not taking: Reported on 07/29/2021)   Not Taking    Review of Systems  Respiratory:  Negative for cough and shortness of breath.   Gastrointestinal:  Negative for abdominal pain.  Neurological:  Positive for syncope and headaches.  Physical Exam   Blood pressure (!) 146/68, pulse 71, temperature 99.2 F (37.3 C), temperature source Oral, resp. rate 16, height 5\' 4"  (1.626 m), weight 88.2 kg, last menstrual period 03/22/2021, SpO2 97 %. Vitals:   07/29/21 1123 07/29/21 1142 07/29/21 1146 07/29/21 1201  BP: (!) 173/72 (!) 146/61 137/85 (!) 146/68  Pulse: 83 77 80 71  Resp: 16     Temp: 99.2 F (37.3 C)     TempSrc: Oral     SpO2: 97%     Weight:      Height:         Physical Exam Constitutional:      General: She is not in acute distress.    Appearance: Normal appearance. She is well-developed. She is not toxic-appearing.  HENT:     Head: Normocephalic and atraumatic.  Cardiovascular:     Rate and Rhythm: Normal rate and regular rhythm.  Pulmonary:     Effort: Pulmonary effort is normal. No respiratory distress.     Breath sounds: Normal breath sounds.  Abdominal:     General: Bowel sounds are normal.     Palpations: Abdomen is soft.     Tenderness: There is no abdominal tenderness.  Musculoskeletal:        General: Normal range of motion.     Cervical back: Normal range of motion.  Skin:    General: Skin is warm and dry.  Neurological:     Mental Status: She is alert.  Psychiatric:        Mood and Affect: Mood normal.        Behavior: Behavior normal.    MAU Course  Procedures Results for orders placed or performed during the hospital encounter of 07/29/21 (from the past 72 hour(s))  Urinalysis, Routine w reflex microscopic Urine, Clean Catch     Status: Abnormal   Collection Time: 07/29/21 11:25 AM  Result Value Ref Range   Color, Urine YELLOW YELLOW   APPearance CLEAR CLEAR   Specific Gravity, Urine <1.005 (L) 1.005 - 1.030   pH 7.0 5.0 - 8.0   Glucose, UA NEGATIVE NEGATIVE mg/dL   Hgb urine dipstick NEGATIVE NEGATIVE   Bilirubin Urine NEGATIVE NEGATIVE   Ketones, ur NEGATIVE NEGATIVE mg/dL   Protein, ur NEGATIVE NEGATIVE mg/dL   Nitrite NEGATIVE NEGATIVE   Leukocytes,Ua NEGATIVE NEGATIVE    Comment: Microscopic not done on urines with negative protein, blood, leukocytes, nitrite, or glucose < 500 mg/dL. Performed at [facility], [place] [address], [place], [place] [address]   Protein / creatinine ratio, urine     Status: None   Collection Time: 07/29/21 11:25 AM  Result Value Ref Range   Creatinine, Urine 19.25 mg/dL   Total Protein, Urine <6 mg/dL   Protein Creatinine Ratio        0.00 -  0.15 mg/mg[Cre]     Comment: RESULT BELOW REPORTABLE RANGE, UNABLE TO CALCULATE. Performed at [facility], [place] [address], [place], [place] [address]   Comprehensive metabolic panel     Status: Abnormal   Collection Time: 07/29/21  1:09 PM  Result Value Ref Range   Sodium 136 135 - 145 mmol/L   Potassium 3.9 3.5 - 5.1 mmol/L   Chloride 105 98 - 111 mmol/L   CO2 24 22 - 32 mmol/L   Glucose, Bld 89 70 - 99 mg/dL    Comment: Glucose reference range applies only to samples taken after fasting for at least 8 hours.   BUN <5 (L) 6 - 20 mg/dL   Creatinine, Ser 0.54 0.44 - 1.00 mg/dL   Calcium 9.4 8.9 - 10.3 mg/dL   Total Protein 6.2 (L) 6.5 - 8.1 g/dL   Albumin 3.3 (L) 3.5 - 5.0 g/dL   AST 14 (L) 15 - 41 U/L   ALT 12 0 - 44 U/L   Alkaline Phosphatase 61 38 - 126 U/L   Total Bilirubin 0.3 0.3 - 1.2 mg/dL   GFR, Estimated >60 >60 mL/min    Comment: (NOTE) Calculated using the CKD-EPI Creatinine Equation (2021)    Anion gap 7 5 - 15    Comment: Performed at [facility], [place] [address] [place], [place] [address]  CBC     Status: Abnormal   Collection Time: 07/29/21  1:09 PM  Result Value Ref Range   WBC 11.0 (H) 4.0 - 10.5 K/uL   RBC 4.00 3.87 - 5.11 MIL/uL   Hemoglobin 12.5 12.0 - 15.0 g/dL   HCT 36.3 36.0 - 46.0 %   MCV 90.8 80.0 - 100.0 fL   MCH 31.3 26.0 - 34.0 pg   MCHC 34.4 30.0 - 36.0 g/dL   RDW 12.2 11.5 - 15.5 %   Platelets 312 150 - 400 K/uL   nRBC 0.0 0.0 - 0.2 %    Comment: Performed at [facility], [place] [address], [place],  [address]    MDM Physical Exam Labs: CBC, CMP, PC Ratio Measure BPQ15 min EFM Pain Management Antihypertensive GI cocktail Analgesic Assessment and Plan  31 year old, [id number]  SIUP at 15.4 weeks CHTN H/O of Preeclampsia Epigastric pain Headache  -Reviewed POC with patient. -Exam performed.  -Informed that she will be given the diagnoses of chronic hypertension. -Procardia 30 mg ordered.  Continue monitoring of blood  pressures every 15 minutes. -Discussed medication for headache and epigastric pain. -Patient agreeable.  GI cocktail and Fioricet ordered -Labs ordered and pending.  Maryann Conners 07/29/2021, 12:16 PM   Reassessment (2:10 PM) Vitals:   07/29/21 1246 07/29/21 1301 07/29/21 1316 07/29/21 1416  BP: 138/69 118/75 120/83 (!) 157/74  Pulse: 70 75 76 64  Resp:      Temp:      TempSrc:      SpO2:      Weight:      Height:       -BP normotensive with procardia dosing. -Informed of need for daily dosing. -Patient questions why her blood pressures are elevated despite bASA dosing.  Informed that bASA is to reduce risk of PreEclampsia and will not treat or eliminate risks. -Rx for Procardia sent to pharmacy on file. -Patient reports relief of HA and epigastric pain. -Instructed to monitor onset of HA at home and if continues may need neurology consult. -Further instructed to increase Pepcid dosing to twice  daily as previously prescribed.  -Instructed to follow up, as scheduled, on Monday for BP check. -Encouraged to call primary office or return to MAU if symptoms worsen or with the onset of new symptoms. -Discharged to home in improved condition.  Maryann Conners MSN, CNM Advanced Practice Provider, [facility]

## 2021-08-02 ENCOUNTER — Ambulatory Visit: Payer: No Typology Code available for payment source

## 2021-08-04 ENCOUNTER — Encounter: Payer: Self-pay | Admitting: *Deleted

## 2021-08-04 ENCOUNTER — Ambulatory Visit: Payer: No Typology Code available for payment source | Attending: Obstetrics and Gynecology

## 2021-08-04 ENCOUNTER — Ambulatory Visit: Payer: No Typology Code available for payment source | Attending: Obstetrics | Admitting: Obstetrics

## 2021-08-04 ENCOUNTER — Other Ambulatory Visit: Payer: Self-pay | Admitting: *Deleted

## 2021-08-04 ENCOUNTER — Other Ambulatory Visit: Payer: Self-pay | Admitting: Obstetrics and Gynecology

## 2021-08-04 ENCOUNTER — Ambulatory Visit: Payer: No Typology Code available for payment source | Admitting: *Deleted

## 2021-08-04 VITALS — BP 131/59 | HR 68

## 2021-08-04 DIAGNOSIS — O10912 Unspecified pre-existing hypertension complicating pregnancy, second trimester: Secondary | ICD-10-CM

## 2021-08-04 DIAGNOSIS — O10012 Pre-existing essential hypertension complicating pregnancy, second trimester: Secondary | ICD-10-CM | POA: Diagnosis not present

## 2021-08-04 DIAGNOSIS — O0990 Supervision of high risk pregnancy, unspecified, unspecified trimester: Secondary | ICD-10-CM | POA: Insufficient documentation

## 2021-08-04 DIAGNOSIS — Z9889 Other specified postprocedural states: Secondary | ICD-10-CM

## 2021-08-04 DIAGNOSIS — Z86711 Personal history of pulmonary embolism: Secondary | ICD-10-CM

## 2021-08-04 DIAGNOSIS — Z3A16 16 weeks gestation of pregnancy: Secondary | ICD-10-CM | POA: Diagnosis not present

## 2021-08-04 DIAGNOSIS — O3430 Maternal care for cervical incompetence, unspecified trimester: Secondary | ICD-10-CM

## 2021-08-04 DIAGNOSIS — O3432 Maternal care for cervical incompetence, second trimester: Secondary | ICD-10-CM | POA: Diagnosis not present

## 2021-08-04 DIAGNOSIS — O09212 Supervision of pregnancy with history of pre-term labor, second trimester: Secondary | ICD-10-CM

## 2021-08-04 DIAGNOSIS — O288 Other abnormal findings on antenatal screening of mother: Secondary | ICD-10-CM | POA: Diagnosis not present

## 2021-08-04 DIAGNOSIS — O09292 Supervision of pregnancy with other poor reproductive or obstetric history, second trimester: Secondary | ICD-10-CM

## 2021-08-04 DIAGNOSIS — O09892 Supervision of other high risk pregnancies, second trimester: Secondary | ICD-10-CM

## 2021-08-04 DIAGNOSIS — Z86718 Personal history of other venous thrombosis and embolism: Secondary | ICD-10-CM

## 2021-08-04 DIAGNOSIS — O34219 Maternal care for unspecified type scar from previous cesarean delivery: Secondary | ICD-10-CM

## 2021-08-05 ENCOUNTER — Encounter: Payer: Self-pay | Admitting: Family Medicine

## 2021-08-05 DIAGNOSIS — Z141 Cystic fibrosis carrier: Secondary | ICD-10-CM | POA: Insufficient documentation

## 2021-08-05 NOTE — Progress Notes (Signed)
MFM Note  Kimberly Stark was seen due to a history of two prior preterm births at 30 weeks and 34 weeks.  She had cerclage is placed during her 2 prior pregnancies.  She would prefer not to have a cerclage placed in her current pregnancy.    Her pregnancy has also been complicated by chronic hypertension treated with nifedipine.  She reports a history of a pulmonary embolus in her prior pregnancy.  She is currently treated with prophylactic Lovenox 40 mg daily.  She had a cell free DNA test indicating a low risk for trisomy 21, 18, and 13.  A female fetus is predicted.    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  The FOB is being screened today to determine if he is also a carrier for the cystic fibrosis gene.  A limited ultrasound performed today shows a viable singleton intrauterine gestation with normal amniotic fluid.    A transvaginal ultrasound performed today shows a cervical length of 5.4 cm long without any signs of funneling.    The following were discussed during today's consultation:  Chronic hypertension in pregnancy  The implications and management of chronic hypertension in pregnancy was discussed.   The patient was advised that should her blood pressures continue to be elevated, the dosage of her antihypertensive medications may need to be increased.    The increased risk of superimposed preeclampsia, an indicated preterm delivery, and possible fetal growth restriction due to chronic hypertension in pregnancy was discussed.   We will continue to follow her with monthly growth scans.   Weekly fetal testing should be started at around 32 weeks.   To decrease her risk of superimposed preeclampsia, she should continue taking a daily baby aspirin (81 mg daily) for preeclampsia prophylaxis.   History of a pulmonary embolus in a prior pregnancy  Due to her history of a prior thromboembolic event, the patient should remain on prophylactic anticoagulation  for the duration of her pregnancy and for 6 weeks postpartum.    In regards to the management of her anticoagulation and delivery, she may either continue the prophylactic Lovenox up until the day before her scheduled delivery date.  Alternatively, she may be switched to twice daily subcu heparin (10,000 units twice daily) at around 35 to 36 weeks.    As she is only treated with a prophylactic dose of anticoagulation, she may be eligible to receive regional anesthesia if it has been 12 hours or more since she received the anticoagulation medication.   History of two prior preterm births  Based on the normal cervical length noted today, she was reassured that her risk of a preterm birth is low.   Therefore, she does not need a cerclage at this time.  We will perform another transvaginal ultrasound to assess the cervical length when she returns for her detailed fetal anatomy scan in 3 weeks.  Carrier for the cystic fibrosis gene  We will await the results of the FOB's test.    Should he also be a carrier for the cystic fibrosis gene, they understand that there is a 25% chance that their child will have cystic fibrosis.  They understand that an amniocentesis is available for prenatal diagnosis of cystic fibrosis.  Should he screen negative as a carrier for cystic fibrosis, the chances that their child will have cystic fibrosis is low. A detailed fetal anatomy scan has been scheduled in 3 weeks.    The patient and her partner stated  that all of their questions were answered today.  A total of 45 minutes was spent counseling and coordinating the care for this patient.  Greater than 50% of the time was spent in direct face-to-face contact.

## 2021-08-11 ENCOUNTER — Other Ambulatory Visit: Payer: Self-pay | Admitting: *Deleted

## 2021-08-11 DIAGNOSIS — O10912 Unspecified pre-existing hypertension complicating pregnancy, second trimester: Secondary | ICD-10-CM

## 2021-08-11 DIAGNOSIS — O3430 Maternal care for cervical incompetence, unspecified trimester: Secondary | ICD-10-CM

## 2021-08-12 DIAGNOSIS — I10 Essential (primary) hypertension: Secondary | ICD-10-CM | POA: Insufficient documentation

## 2021-08-12 NOTE — Progress Notes (Signed)
   PRENATAL VISIT NOTE  Subjective:  Kimberly Stark is a 31 y.o. [id number] at 80w0dbeing seen today for ongoing prenatal care.  She is currently monitored for the following issues for this high-risk pregnancy and has Hx of preeclampsia, prior pregnancy, currently pregnant; Supervision of high risk pregnancy, antepartum; History of cervical cerclage; History of pulmonary embolus (PE); History of macrosomia in infant in prior pregnancy, currently pregnant; Tobacco abuse; History of C-section; Cystic fibrosis carrier; and Chronic hypertension on their problem list.  Patient reports no complaints.  Contractions: Not present. Vag. Bleeding: None.  Movement: Present. Denies leaking of fluid.   The following portions of the patient's history were reviewed and updated as appropriate: allergies, current medications, past family history, past medical history, past social history, past surgical history and problem list.   Objective:   Vitals:   08/17/21 0912  BP: 137/64  Pulse: 82  Weight: 192 lb (87.1 kg)    Fetal Status: Fetal Heart Rate (bpm): 150   Movement: Present     General:  Alert, oriented and cooperative. Patient is in no acute distress.  Skin: Skin is warm and dry. No rash noted.   Cardiovascular: Normal heart rate noted  Respiratory: Normal respiratory effort, no problems with respiration noted  Abdomen: Soft, gravid, appropriate for gestational age.  Pain/Pressure: Present     Pelvic: Cervical exam deferred        Extremities: Normal range of motion.  Edema: Trace  Mental Status: Normal mood and affect. Normal behavior. Normal judgment and thought content.   Assessment and Plan:  Pregnancy: [id number] 112w0d. Hx of preeclampsia, prior pregnancy, currently pregnant Continue ldASA  2. Supervision of high risk pregnancy, antepartum History of PTB/Cerclage - cervix normal at 16w - 5.4 cm!  Offered and recommend MSAFP - drawn today Next [place] 6/14 for CL and anatomy.   3.  History of pulmonary embolus (PE) On lovenox prophy  4. History of macrosomia in infant in prior pregnancy, currently pregnant Will have serial growth in 3rd trimester  5. Tobacco abuse - Had been 1ppd. Then down to 1-2 cpd. Current use stable - using patches to reduce. Down to 14 mg patch.   6. History of C-section Desires TOLAC - h/o successful VBAC. Will sign consent closer to 28w.   7. Cystic fibrosis carrier Previous testing negative per pt. Still recommended FOB testing as the mutations that are tested for have expanded with time.  FOB has the kit.   8. Chronic Hypertension - Diagnosed officially at 15 weeks. Reviewed difference of CHTN vs PreE and timing of diagnosis. Repeat labs in MAU were normal as well which is confirmatory. Reviewed women can still develop SIPE and that ldASA will reduce this risk.  - Reviewed increased risk of CHTN lifelong in women who have had PreE in the past.   Preterm labor symptoms and general obstetric precautions including but not limited to vaginal bleeding, contractions, leaking of fluid and fetal movement were reviewed in detail with the patient. Please refer to After Visit Summary for other counseling recommendations.   No follow-ups on file.  Future Appointments  Date Time Provider [place]6/14/2023 10:30 AM [facility] NURSE [facility]2023 10:45 AM [facility] US[facility]/14/2023 12:00 PM [facility]/22/2023  8:30 AM [facility]  8:45 AM W[facility]-M[facility] US6 W[facility]-M[facility]US W[facility]    PaRadene GunningMD

## 2021-08-17 ENCOUNTER — Encounter: Payer: Self-pay | Admitting: Obstetrics and Gynecology

## 2021-08-17 ENCOUNTER — Ambulatory Visit (INDEPENDENT_AMBULATORY_CARE_PROVIDER_SITE_OTHER): Payer: No Typology Code available for payment source | Admitting: Obstetrics and Gynecology

## 2021-08-17 VITALS — BP 137/64 | HR 82 | Wt 192.0 lb

## 2021-08-17 DIAGNOSIS — Z141 Cystic fibrosis carrier: Secondary | ICD-10-CM

## 2021-08-17 DIAGNOSIS — Z720 Tobacco use: Secondary | ICD-10-CM

## 2021-08-17 DIAGNOSIS — Z98891 History of uterine scar from previous surgery: Secondary | ICD-10-CM

## 2021-08-17 DIAGNOSIS — O09299 Supervision of pregnancy with other poor reproductive or obstetric history, unspecified trimester: Secondary | ICD-10-CM

## 2021-08-17 DIAGNOSIS — Z86711 Personal history of pulmonary embolism: Secondary | ICD-10-CM

## 2021-08-17 DIAGNOSIS — O0990 Supervision of high risk pregnancy, unspecified, unspecified trimester: Secondary | ICD-10-CM

## 2021-08-17 DIAGNOSIS — I10 Essential (primary) hypertension: Secondary | ICD-10-CM

## 2021-08-17 MED ORDER — NIFEDIPINE ER OSMOTIC RELEASE 30 MG PO TB24: 30.0000 mg | ORAL_TABLET | Freq: Two times a day (BID) | ORAL | 3 refills | Status: DC

## 2021-08-17 NOTE — Progress Notes (Signed)
Patient reports headaches 2-3 times a week- tylenol or rest doesn't help, reports feeling "off balance" with headaches

## 2021-08-17 NOTE — Patient Instructions (Signed)
Childbirth Education Options: [facility] Classes:  Childbirth education classes can help you get ready for a positive parenting experience. You can also meet other expectant parents and get free stuff for your baby. Each class runs for five weeks on the same night and costs $45 for the mother-to-be and her support person. Medicaid covers the cost if you are eligible. Call [phone] to register. [facility] Childbirth Education: Classes can vary in availability and schedule is subject to change. For most up-to-date information please visit [email or web address] to review and register.     

## 2021-08-19 LAB — AFP, SERUM, OPEN SPINA BIFIDA
AFP MoM: 0.53
AFP Value: 22.3 ng/mL
Gest. Age on Collection Date: 18 weeks
Maternal Age At EDD: 33.7 yr
OSBR Risk 1 IN: 10000
Test Results:: NEGATIVE
Weight: 192 [lb_av]

## 2021-08-24 ENCOUNTER — Ambulatory Visit: Payer: No Typology Code available for payment source | Attending: Obstetrics and Gynecology | Admitting: *Deleted

## 2021-08-24 ENCOUNTER — Ambulatory Visit: Payer: No Typology Code available for payment source

## 2021-08-24 ENCOUNTER — Ambulatory Visit (HOSPITAL_BASED_OUTPATIENT_CLINIC_OR_DEPARTMENT_OTHER): Payer: No Typology Code available for payment source

## 2021-08-24 ENCOUNTER — Other Ambulatory Visit: Payer: Self-pay | Admitting: *Deleted

## 2021-08-24 VITALS — BP 131/55 | HR 80

## 2021-08-24 DIAGNOSIS — Z3686 Encounter for antenatal screening for cervical length: Secondary | ICD-10-CM | POA: Diagnosis not present

## 2021-08-24 DIAGNOSIS — O34219 Maternal care for unspecified type scar from previous cesarean delivery: Secondary | ICD-10-CM | POA: Diagnosis not present

## 2021-08-24 DIAGNOSIS — O10012 Pre-existing essential hypertension complicating pregnancy, second trimester: Secondary | ICD-10-CM | POA: Diagnosis not present

## 2021-08-24 DIAGNOSIS — O3432 Maternal care for cervical incompetence, second trimester: Secondary | ICD-10-CM | POA: Insufficient documentation

## 2021-08-24 DIAGNOSIS — O09212 Supervision of pregnancy with history of pre-term labor, second trimester: Secondary | ICD-10-CM | POA: Insufficient documentation

## 2021-08-24 DIAGNOSIS — Z7901 Long term (current) use of anticoagulants: Secondary | ICD-10-CM | POA: Insufficient documentation

## 2021-08-24 DIAGNOSIS — O09292 Supervision of pregnancy with other poor reproductive or obstetric history, second trimester: Secondary | ICD-10-CM | POA: Insufficient documentation

## 2021-08-24 DIAGNOSIS — O09899 Supervision of other high risk pregnancies, unspecified trimester: Secondary | ICD-10-CM

## 2021-08-24 DIAGNOSIS — Z86711 Personal history of pulmonary embolism: Secondary | ICD-10-CM

## 2021-08-24 DIAGNOSIS — O3430 Maternal care for cervical incompetence, unspecified trimester: Secondary | ICD-10-CM

## 2021-08-24 DIAGNOSIS — O0990 Supervision of high risk pregnancy, unspecified, unspecified trimester: Secondary | ICD-10-CM

## 2021-08-24 DIAGNOSIS — Z141 Cystic fibrosis carrier: Secondary | ICD-10-CM

## 2021-08-24 DIAGNOSIS — O10912 Unspecified pre-existing hypertension complicating pregnancy, second trimester: Secondary | ICD-10-CM | POA: Diagnosis not present

## 2021-08-24 DIAGNOSIS — Z3A19 19 weeks gestation of pregnancy: Secondary | ICD-10-CM | POA: Insufficient documentation

## 2021-08-24 DIAGNOSIS — O09299 Supervision of pregnancy with other poor reproductive or obstetric history, unspecified trimester: Secondary | ICD-10-CM

## 2021-08-24 DIAGNOSIS — Z148 Genetic carrier of other disease: Secondary | ICD-10-CM | POA: Diagnosis not present

## 2021-08-24 DIAGNOSIS — O99012 Anemia complicating pregnancy, second trimester: Secondary | ICD-10-CM

## 2021-09-01 ENCOUNTER — Ambulatory Visit: Payer: No Typology Code available for payment source | Admitting: *Deleted

## 2021-09-01 ENCOUNTER — Encounter: Payer: Self-pay | Admitting: *Deleted

## 2021-09-01 ENCOUNTER — Ambulatory Visit: Payer: No Typology Code available for payment source | Attending: Obstetrics and Gynecology

## 2021-09-01 VITALS — BP 146/65 | HR 81

## 2021-09-01 DIAGNOSIS — O99332 Smoking (tobacco) complicating pregnancy, second trimester: Secondary | ICD-10-CM

## 2021-09-01 DIAGNOSIS — Z141 Cystic fibrosis carrier: Secondary | ICD-10-CM | POA: Diagnosis present

## 2021-09-01 DIAGNOSIS — O10012 Pre-existing essential hypertension complicating pregnancy, second trimester: Secondary | ICD-10-CM | POA: Diagnosis not present

## 2021-09-01 DIAGNOSIS — O0990 Supervision of high risk pregnancy, unspecified, unspecified trimester: Secondary | ICD-10-CM | POA: Insufficient documentation

## 2021-09-01 DIAGNOSIS — O09292 Supervision of pregnancy with other poor reproductive or obstetric history, second trimester: Secondary | ICD-10-CM

## 2021-09-01 DIAGNOSIS — Z3A20 20 weeks gestation of pregnancy: Secondary | ICD-10-CM

## 2021-09-01 DIAGNOSIS — O3430 Maternal care for cervical incompetence, unspecified trimester: Secondary | ICD-10-CM | POA: Diagnosis present

## 2021-09-01 DIAGNOSIS — O3432 Maternal care for cervical incompetence, second trimester: Secondary | ICD-10-CM

## 2021-09-01 DIAGNOSIS — O34219 Maternal care for unspecified type scar from previous cesarean delivery: Secondary | ICD-10-CM

## 2021-09-01 DIAGNOSIS — O10912 Unspecified pre-existing hypertension complicating pregnancy, second trimester: Secondary | ICD-10-CM | POA: Insufficient documentation

## 2021-09-06 ENCOUNTER — Ambulatory Visit: Payer: No Typology Code available for payment source

## 2021-09-06 ENCOUNTER — Telehealth: Payer: Self-pay | Admitting: General Practice

## 2021-09-06 NOTE — Telephone Encounter (Signed)
Per chart review, patient logged elevated BP into babyscripts app yesterday. Called patient, no answer- left message to check mychart account.

## 2021-09-07 ENCOUNTER — Encounter: Payer: Self-pay | Admitting: Lactation Services

## 2021-09-07 ENCOUNTER — Other Ambulatory Visit: Payer: Self-pay | Admitting: Lactation Services

## 2021-09-07 DIAGNOSIS — O0990 Supervision of high risk pregnancy, unspecified, unspecified trimester: Secondary | ICD-10-CM

## 2021-09-07 MED ORDER — ONDANSETRON HCL 4 MG PO TABS: 4.0000 mg | ORAL_TABLET | Freq: Three times a day (TID) | ORAL | 0 refills | Status: DC | PRN

## 2021-09-07 NOTE — Progress Notes (Signed)
Re ordered Zofran at patients request for N/V with pregnancy.

## 2021-09-15 ENCOUNTER — Encounter: Payer: Self-pay | Admitting: *Deleted

## 2021-09-15 ENCOUNTER — Ambulatory Visit: Payer: No Typology Code available for payment source | Attending: Obstetrics

## 2021-09-15 ENCOUNTER — Ambulatory Visit: Payer: No Typology Code available for payment source | Admitting: *Deleted

## 2021-09-15 ENCOUNTER — Other Ambulatory Visit: Payer: Self-pay | Admitting: *Deleted

## 2021-09-15 VITALS — BP 130/63 | HR 84

## 2021-09-15 DIAGNOSIS — O34219 Maternal care for unspecified type scar from previous cesarean delivery: Secondary | ICD-10-CM

## 2021-09-15 DIAGNOSIS — O10012 Pre-existing essential hypertension complicating pregnancy, second trimester: Secondary | ICD-10-CM

## 2021-09-15 DIAGNOSIS — O09292 Supervision of pregnancy with other poor reproductive or obstetric history, second trimester: Secondary | ICD-10-CM | POA: Diagnosis not present

## 2021-09-15 DIAGNOSIS — O09892 Supervision of other high risk pregnancies, second trimester: Secondary | ICD-10-CM

## 2021-09-15 DIAGNOSIS — O09899 Supervision of other high risk pregnancies, unspecified trimester: Secondary | ICD-10-CM | POA: Insufficient documentation

## 2021-09-15 DIAGNOSIS — O09299 Supervision of pregnancy with other poor reproductive or obstetric history, unspecified trimester: Secondary | ICD-10-CM | POA: Diagnosis present

## 2021-09-15 DIAGNOSIS — O0990 Supervision of high risk pregnancy, unspecified, unspecified trimester: Secondary | ICD-10-CM | POA: Insufficient documentation

## 2021-09-15 DIAGNOSIS — Z86711 Personal history of pulmonary embolism: Secondary | ICD-10-CM | POA: Insufficient documentation

## 2021-09-15 DIAGNOSIS — Z3A22 22 weeks gestation of pregnancy: Secondary | ICD-10-CM

## 2021-09-15 DIAGNOSIS — Z141 Cystic fibrosis carrier: Secondary | ICD-10-CM

## 2021-09-15 DIAGNOSIS — O10912 Unspecified pre-existing hypertension complicating pregnancy, second trimester: Secondary | ICD-10-CM | POA: Diagnosis present

## 2021-09-15 DIAGNOSIS — O3430 Maternal care for cervical incompetence, unspecified trimester: Secondary | ICD-10-CM | POA: Diagnosis not present

## 2021-09-15 DIAGNOSIS — O3432 Maternal care for cervical incompetence, second trimester: Secondary | ICD-10-CM | POA: Diagnosis not present

## 2021-09-15 DIAGNOSIS — O99332 Smoking (tobacco) complicating pregnancy, second trimester: Secondary | ICD-10-CM

## 2021-09-19 DIAGNOSIS — N870 Mild cervical dysplasia: Secondary | ICD-10-CM | POA: Insufficient documentation

## 2021-09-19 DIAGNOSIS — G4700 Insomnia, unspecified: Secondary | ICD-10-CM

## 2021-09-19 DIAGNOSIS — F4312 Post-traumatic stress disorder, chronic: Secondary | ICD-10-CM | POA: Insufficient documentation

## 2021-09-19 DIAGNOSIS — H409 Unspecified glaucoma: Secondary | ICD-10-CM

## 2021-09-19 DIAGNOSIS — G43109 Migraine with aura, not intractable, without status migrainosus: Secondary | ICD-10-CM

## 2021-09-19 HISTORY — DX: Migraine with aura, not intractable, without status migrainosus: G43.109

## 2021-09-19 HISTORY — DX: Unspecified glaucoma: H40.9

## 2021-09-19 HISTORY — DX: Post-traumatic stress disorder, chronic: F43.12

## 2021-09-19 HISTORY — DX: Mild cervical dysplasia: N87.0

## 2021-09-19 HISTORY — DX: Insomnia, unspecified: G47.00

## 2021-09-19 NOTE — Progress Notes (Unsigned)
   PRENATAL VISIT NOTE  Subjective:  Kimberly Stark is a 31 y.o. [id number] at [redacted]w[redacted]d being seen today for ongoing prenatal care.  She is currently monitored for the following issues for this high-risk pregnancy and has Hx of preeclampsia, prior pregnancy, currently pregnant; Supervision of high risk pregnancy, antepartum; History of cervical cerclage; History of pulmonary embolus (PE); History of macrosomia in infant in prior pregnancy, currently pregnant; Tobacco abuse; History of C-section; Cystic fibrosis carrier; Chronic hypertension; Migraine with typical aura; Mild dysplasia of cervix (CIN I); Insomnia; Glaucoma; and Chronic post-traumatic stress disorder on their problem list.  Patient reports {sx:14538}.   .  .   . Denies leaking of fluid.   The following portions of the patient's history were reviewed and updated as appropriate: allergies, current medications, past family history, past medical history, past social history, past surgical history and problem list.   Objective:  There were no vitals filed for this visit.  Fetal Status:           General:  Alert, oriented and cooperative. Patient is in no acute distress.  Skin: Skin is warm and dry. No rash noted.   Cardiovascular: Normal heart rate noted  Respiratory: Normal respiratory effort, no problems with respiration noted  Abdomen: Soft, gravid, appropriate for gestational age.        Pelvic: Cervical exam deferred        Extremities: Normal range of motion.     Mental Status: Normal mood and affect. Normal behavior. Normal judgment and thought content.   Assessment and Plan:  Pregnancy: [id number] at [redacted]w[redacted]d 1. Chronic hypertension - Diagnosed officially at 15 weeks.  - Procardia 30 BID - Today ***  2. Supervision of high risk pregnancy, antepartum 28w labs next time ***  3. Hx of preeclampsia, prior pregnancy, currently pregnant Continue ldASA  4. History of cervical cerclage History of PTB/Cerclage - cervix normal at  22w- 5.4 cm!  Next [place] 7/20 for final CL  5. History of pulmonary embolus (PE) Continue Lovenox 40  6. History of macrosomia in infant in prior pregnancy, currently pregnant Normal growth on 7/6. Next growth is on 7/20 at 24 weeks.   7. Tobacco abuse Down to 1-2cpd  8. History of C-section Desires TOLAC - will do consent closer to 28w  9. Cystic fibrosis carrier ***  {Blank single:19197::"Term","Preterm"} labor symptoms and general obstetric precautions including but not limited to vaginal bleeding, contractions, leaking of fluid and fetal movement were reviewed in detail with the patient. Please refer to After Visit Summary for other counseling recommendations.   No follow-ups on file.  Future Appointments  Date Time Provider Department Center  09/21/2021  4:15 PM Milas Hock, MD [facility]  09/29/2021  7:30 AM [facility] NURSE [facility]  09/29/2021  7:45 AM [facility] US6 [facility]  10/27/2021 11:15 AM [facility] NURSE [facility]  10/27/2021 11:30 AM [facility] US3 [facility]  11/24/2021 11:15 AM [facility] NURSE [facility]  11/24/2021 11:30 AM [facility] US3 [facility]    Milas Hock, MD

## 2021-09-20 ENCOUNTER — Other Ambulatory Visit: Payer: Self-pay | Admitting: Lactation Services

## 2021-09-20 MED ORDER — FAMOTIDINE 20 MG PO TABS: 20.0000 mg | ORAL_TABLET | Freq: Two times a day (BID) | ORAL | 2 refills | Status: DC

## 2021-09-21 ENCOUNTER — Other Ambulatory Visit: Payer: Self-pay

## 2021-09-21 ENCOUNTER — Ambulatory Visit (INDEPENDENT_AMBULATORY_CARE_PROVIDER_SITE_OTHER): Payer: No Typology Code available for payment source | Admitting: Obstetrics and Gynecology

## 2021-09-21 ENCOUNTER — Encounter: Payer: Self-pay | Admitting: Obstetrics and Gynecology

## 2021-09-21 VITALS — BP 135/68 | HR 92 | Wt 208.8 lb

## 2021-09-21 DIAGNOSIS — O0990 Supervision of high risk pregnancy, unspecified, unspecified trimester: Secondary | ICD-10-CM

## 2021-09-21 DIAGNOSIS — O09299 Supervision of pregnancy with other poor reproductive or obstetric history, unspecified trimester: Secondary | ICD-10-CM

## 2021-09-21 DIAGNOSIS — Z9889 Other specified postprocedural states: Secondary | ICD-10-CM

## 2021-09-21 DIAGNOSIS — Z86711 Personal history of pulmonary embolism: Secondary | ICD-10-CM

## 2021-09-21 DIAGNOSIS — I10 Essential (primary) hypertension: Secondary | ICD-10-CM

## 2021-09-21 DIAGNOSIS — Z720 Tobacco use: Secondary | ICD-10-CM

## 2021-09-21 DIAGNOSIS — Z98891 History of uterine scar from previous surgery: Secondary | ICD-10-CM

## 2021-09-21 DIAGNOSIS — Z141 Cystic fibrosis carrier: Secondary | ICD-10-CM

## 2021-09-29 ENCOUNTER — Ambulatory Visit: Payer: No Typology Code available for payment source | Admitting: *Deleted

## 2021-09-29 ENCOUNTER — Encounter (HOSPITAL_COMMUNITY): Payer: Self-pay | Admitting: Obstetrics and Gynecology

## 2021-09-29 ENCOUNTER — Inpatient Hospital Stay (HOSPITAL_COMMUNITY)
Admission: AD | Admit: 2021-09-29 | Discharge: 2021-09-29 | Disposition: A | Discharge status: Home / Self Care | Payer: No Typology Code available for payment source | Attending: Obstetrics and Gynecology | Admitting: Obstetrics and Gynecology

## 2021-09-29 ENCOUNTER — Ambulatory Visit: Payer: No Typology Code available for payment source | Attending: Maternal & Fetal Medicine

## 2021-09-29 ENCOUNTER — Ambulatory Visit (HOSPITAL_BASED_OUTPATIENT_CLINIC_OR_DEPARTMENT_OTHER): Payer: No Typology Code available for payment source | Admitting: Maternal & Fetal Medicine

## 2021-09-29 VITALS — BP 144/73 | HR 90

## 2021-09-29 DIAGNOSIS — O3430 Maternal care for cervical incompetence, unspecified trimester: Secondary | ICD-10-CM | POA: Diagnosis present

## 2021-09-29 DIAGNOSIS — Z3A24 24 weeks gestation of pregnancy: Secondary | ICD-10-CM | POA: Diagnosis not present

## 2021-09-29 DIAGNOSIS — H539 Unspecified visual disturbance: Secondary | ICD-10-CM | POA: Diagnosis not present

## 2021-09-29 DIAGNOSIS — O0990 Supervision of high risk pregnancy, unspecified, unspecified trimester: Secondary | ICD-10-CM | POA: Insufficient documentation

## 2021-09-29 DIAGNOSIS — O34219 Maternal care for unspecified type scar from previous cesarean delivery: Secondary | ICD-10-CM | POA: Diagnosis not present

## 2021-09-29 DIAGNOSIS — Z79899 Other long term (current) drug therapy: Secondary | ICD-10-CM | POA: Insufficient documentation

## 2021-09-29 DIAGNOSIS — Z141 Cystic fibrosis carrier: Secondary | ICD-10-CM | POA: Diagnosis present

## 2021-09-29 DIAGNOSIS — O3432 Maternal care for cervical incompetence, second trimester: Secondary | ICD-10-CM

## 2021-09-29 DIAGNOSIS — O0992 Supervision of high risk pregnancy, unspecified, second trimester: Secondary | ICD-10-CM | POA: Insufficient documentation

## 2021-09-29 DIAGNOSIS — O26892 Other specified pregnancy related conditions, second trimester: Secondary | ICD-10-CM | POA: Diagnosis not present

## 2021-09-29 DIAGNOSIS — Z3492 Encounter for supervision of normal pregnancy, unspecified, second trimester: Secondary | ICD-10-CM

## 2021-09-29 DIAGNOSIS — R1011 Right upper quadrant pain: Secondary | ICD-10-CM | POA: Insufficient documentation

## 2021-09-29 DIAGNOSIS — O99332 Smoking (tobacco) complicating pregnancy, second trimester: Secondary | ICD-10-CM | POA: Insufficient documentation

## 2021-09-29 DIAGNOSIS — I10 Essential (primary) hypertension: Secondary | ICD-10-CM

## 2021-09-29 DIAGNOSIS — O10912 Unspecified pre-existing hypertension complicating pregnancy, second trimester: Secondary | ICD-10-CM | POA: Insufficient documentation

## 2021-09-29 DIAGNOSIS — O99212 Obesity complicating pregnancy, second trimester: Secondary | ICD-10-CM | POA: Insufficient documentation

## 2021-09-29 DIAGNOSIS — R519 Headache, unspecified: Secondary | ICD-10-CM | POA: Insufficient documentation

## 2021-09-29 DIAGNOSIS — O09892 Supervision of other high risk pregnancies, second trimester: Secondary | ICD-10-CM | POA: Diagnosis present

## 2021-09-29 DIAGNOSIS — O10012 Pre-existing essential hypertension complicating pregnancy, second trimester: Secondary | ICD-10-CM

## 2021-09-29 DIAGNOSIS — O10919 Unspecified pre-existing hypertension complicating pregnancy, unspecified trimester: Secondary | ICD-10-CM

## 2021-09-29 DIAGNOSIS — F17210 Nicotine dependence, cigarettes, uncomplicated: Secondary | ICD-10-CM

## 2021-09-29 DIAGNOSIS — O162 Unspecified maternal hypertension, second trimester: Secondary | ICD-10-CM

## 2021-09-29 DIAGNOSIS — O09292 Supervision of pregnancy with other poor reproductive or obstetric history, second trimester: Secondary | ICD-10-CM

## 2021-09-29 HISTORY — DX: Urinary tract infection, site not specified: N39.0

## 2021-09-29 HISTORY — DX: Unspecified asthma, uncomplicated: J45.909

## 2021-09-29 LAB — PROTEIN / CREATININE RATIO, URINE
Creatinine, Urine: 37 mg/dL
Total Protein, Urine: <6 mg/dL

## 2021-09-29 LAB — COMPREHENSIVE METABOLIC PANEL
ALT: 12 U/L (ref 0–44)
AST: 14 U/L — ABNORMAL LOW (ref 15–41)
Albumin: 3 g/dL — ABNORMAL LOW (ref 3.5–5.0)
Alkaline Phosphatase: 75 U/L (ref 38–126)
Anion gap: 6 (ref 5–15)
BUN: <5 mg/dL — ABNORMAL LOW (ref 6–20)
CO2: 23 mmol/L (ref 22–32)
Calcium: 9 mg/dL (ref 8.9–10.3)
Chloride: 106 mmol/L (ref 98–111)
Creatinine, Ser: 0.56 mg/dL (ref 0.44–1.00)
GFR, Estimated: >60 mL/min (ref >60)
Glucose, Bld: 95 mg/dL (ref 70–99)
Potassium: 3.6 mmol/L (ref 3.5–5.1)
Sodium: 135 mmol/L (ref 135–145)
Total Bilirubin: 0.7 mg/dL (ref 0.3–1.2)
Total Protein: 5.8 g/dL — ABNORMAL LOW (ref 6.5–8.1)

## 2021-09-29 LAB — URINALYSIS, ROUTINE W REFLEX MICROSCOPIC
Bilirubin Urine: NEGATIVE
Glucose, UA: NEGATIVE mg/dL
Hgb urine dipstick: NEGATIVE
Ketones, ur: NEGATIVE mg/dL
Leukocytes,Ua: NEGATIVE
Nitrite: NEGATIVE
Protein, ur: NEGATIVE mg/dL
Specific Gravity, Urine: 1.004 — ABNORMAL LOW (ref 1.005–1.030)
pH: 6 (ref 5.0–8.0)

## 2021-09-29 LAB — CBC
HCT: 33.3 % — ABNORMAL LOW (ref 36.0–46.0)
Hemoglobin: 11.1 g/dL — ABNORMAL LOW (ref 12.0–15.0)
MCH: 29.4 pg (ref 26.0–34.0)
MCHC: 33.3 g/dL (ref 30.0–36.0)
MCV: 88.1 fL (ref 80.0–100.0)
Platelets: 364 10*3/uL (ref 150–400)
RBC: 3.78 MIL/uL — ABNORMAL LOW (ref 3.87–5.11)
RDW: 12.3 % (ref 11.5–15.5)
WBC: 10.8 10*3/uL — ABNORMAL HIGH (ref 4.0–10.5)
nRBC: 0 % (ref 0.0–0.2)

## 2021-09-29 MED ORDER — DIPHENHYDRAMINE HCL 50 MG/ML IJ SOLN: 25.0000 mg | INTRAMUSCULAR | Status: DC

## 2021-09-29 MED ORDER — LABETALOL HCL 5 MG/ML IV SOLN: 80.0000 mg | INTRAVENOUS | Status: DC | PRN

## 2021-09-29 MED ORDER — ACETAMINOPHEN-CAFFEINE 500-65 MG PO TABS
2.0000 | ORAL_TABLET | Freq: Once | ORAL | Status: AC
  Administered 2021-09-29: 2 via ORAL
  Filled 2021-09-29: qty 2

## 2021-09-29 MED ORDER — DIPHENHYDRAMINE HCL 25 MG PO CAPS
25.0000 mg | ORAL_CAPSULE | Freq: Once | ORAL | Status: AC
  Administered 2021-09-29: 25 mg via ORAL
  Filled 2021-09-29: qty 1

## 2021-09-29 MED ORDER — LABETALOL HCL 5 MG/ML IV SOLN: 40.0000 mg | INTRAVENOUS | Status: DC | PRN

## 2021-09-29 MED ORDER — LABETALOL HCL 5 MG/ML IV SOLN: 20.0000 mg | INTRAVENOUS | Status: DC | PRN

## 2021-09-29 MED ORDER — HYDRALAZINE HCL 20 MG/ML IJ SOLN: 10.0000 mg | INTRAMUSCULAR | Status: DC | PRN

## 2021-09-29 MED ORDER — LABETALOL HCL 200 MG PO TABS: 100.0000 mg | ORAL_TABLET | Freq: Two times a day (BID) | ORAL | 6 refills | Status: DC

## 2021-09-29 MED ORDER — PROMETHAZINE HCL 25 MG PO TABS
12.5000 mg | ORAL_TABLET | Freq: Once | ORAL | Status: AC
  Administered 2021-09-29: 12.5 mg via ORAL
  Filled 2021-09-29: qty 1

## 2021-09-29 MED ORDER — PROCHLORPERAZINE EDISYLATE 10 MG/2ML IJ SOLN: 10.0000 mg | Freq: Once | INTRAMUSCULAR | Status: DC

## 2021-09-29 NOTE — MAU Provider Note (Signed)
History     CSN: [id number]  Arrival date and time: 09/29/21 0935   Event Date/Time   First Provider Initiated Contact with Patient 09/29/21 1052      Chief Complaint  Patient presents with   Hypertension   Headache   Kimberly Stark is a 31 y.o. year old G11P0282 female at [redacted]w[redacted]d weeks gestation who was sent to MAU from MFM reporting visual changes, dizziness, extreme fatigue, H/A, RUQ pain since Sunday 09/25/2021. She reports that she was "supposed to come to MAU on Tuesday," but had no transportation ([facility] works out of town). She had elevated BPs at her MFM appt today and was told to come for evaluation. She rates the H/A and RUQ pain a 4/10. She reports the RUQ pain is not as bad as it was Sunday and Monday. She also reports DFM since Sunday. She states "she is now as active as she usually is today. I guess because I'm feeling better and they were doing my cervical length at my U/S today." Her high-risk pregnancy is complicated by: smoker, obesity, cHTN, h/o PEC, h/o incompetent cervix (previous cerclage), and SCT. She currently takes Procardia 30 mg BID and bASA daily. She receives [facility] with [facility]; next appt is 10/19/2021. Her boyfriend is present and contributing to the history taking.   OB History     Gravida  11   Para  2   Term  0   Preterm  2   AB  8   Living  2      SAB  7   IAB  1   Ectopic  0   Multiple  0   Live Births  2        Obstetric Comments  Pre-E with both preg, induced with both         Past Medical History:  Diagnosis Date   Asthma    Bipolar 1 disorder (HCC)    Pregnancy induced hypertension    Preterm labor    UTI (urinary tract infection)     Past Surgical History:  Procedure Laterality Date   CERVICAL BIOPSY  W/ LOOP ELECTRODE EXCISION     CERVICAL CERCLAGE     CESAREAN SECTION     DILATION AND CURETTAGE OF UTERUS      Family History  Problem Relation Age of Onset   Stroke Mother    Heart disease Mother        hx open  heart issues   Hypertension Mother    Diabetes Mother    Cancer Father        started as colon, w/mets   Kidney disease Father        renal failure   Diabetes Father    Cancer Maternal Grandmother    Diabetes Paternal Grandmother     Social History   Tobacco Use   Smoking status: Some Days    Packs/day: 0.25    Types: Cigarettes   Smokeless tobacco: Never  Vaping Use   Vaping Use: Former   Substances: Nicotine  Substance Use Topics   Alcohol use: Not Currently   Drug use: Not Currently    Allergies:  Allergies  Allergen Reactions   Latex Anaphylaxis   Shellfish Allergy Anaphylaxis   Iodine Hives   Penicillins Hives    Medications Prior to Admission  Medication Sig Dispense Refill Last Dose   aspirin EC 81 MG tablet Take 1 tablet (81 mg total) by mouth daily. Swallow whole. 30 tablet 11 09/29/2021  Cholecalciferol (VITAMIN D3 PO) Take by mouth.   09/29/2021   enoxaparin (LOVENOX) 40 MG/0.4ML injection Inject 0.4 mLs (40 mg total) into the skin daily. 12 mL 8 09/28/2021   famotidine (PEPCID) 20 MG tablet Take 1 tablet (20 mg total) by mouth 2 (two) times daily. 60 tablet 2 09/29/2021   ferrous sulfate 325 (65 FE) MG EC tablet Take 325 mg by mouth 3 (three) times daily with meals.   09/29/2021   nicotine polacrilex (COMMIT) 2 MG lozenge DISSOLVE 1 LOZENGE IN MOUTH EVERY FOUR HOURS AS NEEDED   09/28/2021   NIFEdipine (PROCARDIA-XL/NIFEDICAL-XL) 30 MG 24 hr tablet Take 1 tablet (30 mg total) by mouth in the morning and at bedtime. 60 tablet 3 09/29/2021   ondansetron (ZOFRAN) 4 MG tablet Take 1 tablet (4 mg total) by mouth every 8 (eight) hours as needed for nausea or vomiting. 20 tablet 0 09/29/2021   Prenatal MV & Min w/FA-DHA (PRENATAL GUMMIES PO) Take by mouth.   09/29/2021   sertraline (ZOLOFT) 100 MG tablet TAKE ONE-HALF TABLET BY MOUTH IN THE MORNING FOR 3 DAYS, THEN TAKE ONE TABLET IN THE MORNING FOR 30 DAYS FOR MENTAL HEALTH   09/29/2021    Review of Systems   Constitutional:  Positive for fatigue.  HENT: Negative.    Eyes:  Positive for visual disturbance.  Cardiovascular:  Positive for leg swelling (none today).  Gastrointestinal: Negative.   Endocrine: Negative.   Genitourinary: Negative.   Musculoskeletal: Negative.   Skin: Negative.   Allergic/Immunologic: Negative.   Neurological:  Positive for headaches.  Hematological: Negative.   Psychiatric/Behavioral: Negative.     Physical Exam  Patient Vitals for the past 24 hrs:  BP Temp Temp src Pulse Resp SpO2 Height Weight  09/29/21 1131 133/65 -- -- 76 -- -- -- --  09/29/21 1116 (!) 145/71 -- -- 91 -- -- -- --  09/29/21 1101 (!) 154/70 -- -- 81 -- -- -- --  09/29/21 1046 132/70 -- -- 94 -- -- -- --  09/29/21 1045 -- -- -- -- -- 99 % -- --  09/29/21 1031 113/66 -- -- 86 -- -- -- --  09/29/21 1023 116/65 -- -- 90 -- -- -- --  09/29/21 0946 (!) 142/63 98.4 F (36.9 C) Oral 84 18 99 % 5\' 4"  (1.626 m) 95.8 kg    Physical Exam Vitals and nursing note reviewed.  Constitutional:      Appearance: Normal appearance. She is obese.  Cardiovascular:     Rate and Rhythm: Normal rate.     Pulses: Normal pulses.     Heart sounds: Normal heart sounds.  Pulmonary:     Effort: Pulmonary effort is normal.  Abdominal:     Palpations: Abdomen is soft.  Genitourinary:    Comments: Not indicated Musculoskeletal:        General: Normal range of motion.  Skin:    General: Skin is warm and dry.  Neurological:     Mental Status: She is alert and oriented to person, place, and time.  Psychiatric:        Mood and Affect: Mood normal.        Behavior: Behavior normal.        Thought Content: Thought content normal.        Judgment: Judgment normal.    REASSURING NST (for gestational age) - FHR: 145 bpm / moderate variability / accels present / decels absent / TOCO: none MAU Course  Procedures  MDM CCUA CBC CMP  P/C Ratio Serial BP's  Labetalol 200 mg po Benadryl 25 mg po -- H/A down to  2/10 from 4/10 Compazine 10 mg po -- H/A down to 2/10 from 4/10 Excedrin Tension Headache 2 tabs po -- H/A down to 2/10 from 4/10  *Consult with Dr. Alysia Penna @ 1202 - notified of patient's complaints, assessments, lab & NST results, tx plan change HTN medication to Labetalol 200 mg BID and BP check in 1 week - ok to d/c home, agrees with plan   Results for orders placed or performed during the hospital encounter of 09/29/21 (from the past 24 hour(s))  CBC     Status: Abnormal   Collection Time: 09/29/21 10:01 AM  Result Value Ref Range   WBC 10.8 (H) 4.0 - 10.5 K/uL   RBC 3.78 (L) 3.87 - 5.11 MIL/uL   Hemoglobin 11.1 (L) 12.0 - 15.0 g/dL   HCT 05.3 (L) 97.6 - 73.4 %   MCV 88.1 80.0 - 100.0 fL   MCH 29.4 26.0 - 34.0 pg   MCHC 33.3 30.0 - 36.0 g/dL   RDW 19.3 79.0 - 24.0 %   Platelets 364 150 - 400 K/uL   nRBC 0.0 0.0 - 0.2 %  Comprehensive metabolic panel     Status: Abnormal   Collection Time: 09/29/21 10:01 AM  Result Value Ref Range   Sodium 135 135 - 145 mmol/L   Potassium 3.6 3.5 - 5.1 mmol/L   Chloride 106 98 - 111 mmol/L   CO2 23 22 - 32 mmol/L   Glucose, Bld 95 70 - 99 mg/dL   BUN <5 (L) 6 - 20 mg/dL   Creatinine, Ser 9.73 0.44 - 1.00 mg/dL   Calcium 9.0 8.9 - 53.2 mg/dL   Total Protein 5.8 (L) 6.5 - 8.1 g/dL   Albumin 3.0 (L) 3.5 - 5.0 g/dL   AST 14 (L) 15 - 41 U/L   ALT 12 0 - 44 U/L   Alkaline Phosphatase 75 38 - 126 U/L   Total Bilirubin 0.7 0.3 - 1.2 mg/dL   GFR, Estimated >99 >24 mL/min   Anion gap 6 5 - 15  Protein / creatinine ratio, urine     Status: None   Collection Time: 09/29/21 10:22 AM  Result Value Ref Range   Creatinine, Urine 37 mg/dL   Total Protein, Urine <6 mg/dL   Protein Creatinine Ratio RESULT BELOW REPORTABLE RANGE,  UNABLE TO CALCULATE   0.00 - 0.15 mg/mg[Cre]  Urinalysis, Routine w reflex microscopic Urine, Clean Catch     Status: Abnormal   Collection Time: 09/29/21 10:22 AM  Result Value Ref Range   Color, Urine STRAW (A) YELLOW    APPearance CLEAR CLEAR   Specific Gravity, Urine 1.004 (L) 1.005 - 1.030   pH 6.0 5.0 - 8.0   Glucose, UA NEGATIVE NEGATIVE mg/dL   Hgb urine dipstick NEGATIVE NEGATIVE   Bilirubin Urine NEGATIVE NEGATIVE   Ketones, ur NEGATIVE NEGATIVE mg/dL   Protein, ur NEGATIVE NEGATIVE mg/dL   Nitrite NEGATIVE NEGATIVE   Leukocytes,Ua NEGATIVE NEGATIVE      Assessment and Plan  Headache in pregnancy, antepartum, second trimester - Advised to take Tylenol 1000 mg po every 8 hours prn pain - Advised to call [facility], if H/A is not relieved with Tylenol  Chronic hypertension during pregnancy - Stop taking Procardia 30 mg BID - Rx for Labetalol 200 mg po BID - BP check in 1 week  Fetal movement present, second trimester - Reassurance given that  FM is WNL  [redacted] weeks gestation of pregnancy   - Discharge home - Message sent to [facility] to schedule patient for BP check in 1 week - Patient verbalized an understanding of the plan of care and agrees.    Raelyn Mora, CNM 09/29/2021, 11:22 AM

## 2021-09-29 NOTE — Discharge Instructions (Signed)
STOP taking Procardia. You have been switched to Labetalol 200 mg twice daily (you received your first dose in MAU). Please continue to take Tylenol 1000 mg every EIGHT hours for headache or pain. Please call [facility], if headache not relieved by Tylenol.

## 2021-09-29 NOTE — MAU Note (Signed)
Kimberly Stark is a 31 y.o. at [redacted]w[redacted]d here in MAU reporting: has been sick since [facility] vision, dizzy,extremely tired). Was to have come in on Tues, hx of Pre E, boyfriend had already left for work. Had appt today, BP was up, has HA, here for pre-E eval. Still having RUQ pain, not as bad as Sun/Mon.  Decrease in FM   Onset of complaint: Sunday Pain score: RUQ 4, HA 4   Vitals:   09/29/21 0946  BP: (!) 142/63  Pulse: 84  Resp: 18  Temp: 98.4 F (36.9 C)  SpO2: 99%     VAN:VBTYOM dress on, unable in triage Lab orders placed from triage:  UA/PCR

## 2021-09-29 NOTE — Progress Notes (Signed)
MFM Brief Note  Kimberly Stark is [name] here for cervical length she is seen at the request of Kimberly Stark  A limited exam was performed to assess the cervical length given prior history of cervical incompetence. Good fetal movement and amniotic fluid.  She has known chronic hypertension on procardia. She has BP today of 143/68, 144/73 and repeat 138/69 mmHg.  She reports headache, RUQ pain and vision changes.   Given her history of preterm delivery due to preeclampsia I have asked her to go to the MAU for further evaluation to include CBC, CMP and UPC.  I discussed this plan with Dr. Crissie Reese who agrees.   I spent 20 minutes with > 50% in face to face consultation.  Novella Olive, MD

## 2021-10-06 ENCOUNTER — Ambulatory Visit: Payer: No Typology Code available for payment source

## 2021-10-19 ENCOUNTER — Other Ambulatory Visit: Payer: Self-pay

## 2021-10-19 ENCOUNTER — Ambulatory Visit (INDEPENDENT_AMBULATORY_CARE_PROVIDER_SITE_OTHER): Payer: No Typology Code available for payment source | Admitting: Obstetrics and Gynecology

## 2021-10-19 ENCOUNTER — Other Ambulatory Visit: Payer: Medicaid Other

## 2021-10-19 VITALS — BP 126/90 | HR 94 | Wt 211.7 lb

## 2021-10-19 DIAGNOSIS — O0990 Supervision of high risk pregnancy, unspecified, unspecified trimester: Secondary | ICD-10-CM

## 2021-10-19 DIAGNOSIS — I10 Essential (primary) hypertension: Secondary | ICD-10-CM

## 2021-10-19 DIAGNOSIS — Z98891 History of uterine scar from previous surgery: Secondary | ICD-10-CM

## 2021-10-19 DIAGNOSIS — O0992 Supervision of high risk pregnancy, unspecified, second trimester: Secondary | ICD-10-CM

## 2021-10-19 DIAGNOSIS — Z23 Encounter for immunization: Secondary | ICD-10-CM

## 2021-10-19 DIAGNOSIS — Z9889 Other specified postprocedural states: Secondary | ICD-10-CM

## 2021-10-19 DIAGNOSIS — O09292 Supervision of pregnancy with other poor reproductive or obstetric history, second trimester: Secondary | ICD-10-CM

## 2021-10-19 DIAGNOSIS — O09299 Supervision of pregnancy with other poor reproductive or obstetric history, unspecified trimester: Secondary | ICD-10-CM

## 2021-10-19 DIAGNOSIS — Z86711 Personal history of pulmonary embolism: Secondary | ICD-10-CM

## 2021-10-19 DIAGNOSIS — Z3A27 27 weeks gestation of pregnancy: Secondary | ICD-10-CM

## 2021-10-19 DIAGNOSIS — Z141 Cystic fibrosis carrier: Secondary | ICD-10-CM

## 2021-10-19 NOTE — Progress Notes (Addendum)
PRENATAL VISIT NOTE  Subjective:  Kimberly Stark is a 32 y.o. [id number] at [redacted]w[redacted]d being seen today for ongoing prenatal care.  She is currently monitored for the following issues for this high-risk pregnancy and has Hx of preeclampsia, prior pregnancy, currently pregnant; Supervision of high risk pregnancy, antepartum; History of cervical cerclage; History of pulmonary embolus (PE); History of macrosomia in infant in prior pregnancy, currently pregnant; Tobacco abuse; History of C-section; Cystic fibrosis carrier; Chronic hypertension; Migraine with typical aura; Mild dysplasia of cervix (CIN I); Insomnia; Glaucoma; and Chronic post-traumatic stress disorder on their problem list.  Patient doing well with no acute concerns today. She reports no complaints.  Contractions: Not present. Vag. Bleeding: None.  Movement: Present. Denies leaking of fluid.    Discussed TOLAC vs repeat c/s  Faculty Practice OB/GYN Attending Consult Note  31 y.o. [id number] at [redacted]w[redacted]d with Estimated Date of Delivery: 01/18/22 was seen today in office to discuss trial of labor after cesarean section (TOLAC) versus elective repeat cesarean delivery (ERCD). The following risks were discussed with the patient.  Risk of uterine rupture at term is 0.78 percent with TOLAC and 0.22 percent with ERCD. 1 in 10 uterine ruptures will result in neonatal death or neurological injury. The benefits of a trial of labor after cesarean (TOLAC) resulting in a vaginal birth after cesarean (VBAC) include the following: shorter length of hospital stay and postpartum recovery (in most cases); fewer complications, such as postpartum fever, wound or uterine infection, thromboembolism (blood clots in the leg or lung), need for blood transfusion and fewer neonatal breathing problems. The risks of an attempted VBAC or TOLAC include the following: Risk of failed trial of labor after cesarean (TOLAC) without a vaginal birth after cesarean (VBAC) resulting in  repeat cesarean delivery (RCD) in about 20 to 40 percent of women who attempt VBAC.  Risk of rupture of uterus resulting in an emergency cesarean delivery. The risk of uterine rupture may be related in part to the type of uterine incision made during the first cesarean delivery. A previous transverse uterine incision has the lowest risk of rupture (0.2 to 1.5 percent risk). Vertical or T-shaped uterine incisions have a higher risk of uterine rupture (4 to 9 percent risk)The risk of fetal death is very low with both VBAC and elective repeat cesarean delivery (ERCD), but the likelihood of fetal death is higher with VBAC than with ERCD. Maternal death is very rare with either type of delivery. The risks of an elective repeat cesarean delivery (ERCD) were reviewed with the patient including but not limited to: 04/998 risk of uterine rupture which could have serious consequences, bleeding which may require transfusion; infection which may require antibiotics; injury to bowel, bladder or other surrounding organs (bowel, bladder, ureters); injury to the fetus; need for additional procedures including hysterectomy in the event of a life-threatening hemorrhage; thromboembolic phenomenon; abnormal placentation; incisional problems; death and other postoperative or anesthesia complications.    These risks and benefits are summarized on the consent form, which was reviewed with the patient during the visit.  All her questions answered .  Pt desires more time to review the consent and discuss with her spouse.  She will liekly sign at her next visit.   The following portions of the patient's history were reviewed and updated as appropriate: allergies, current medications, past family history, past medical history, past social history, past surgical history and problem list. Problem list updated.  Objective:   Vitals:   10/19/21 0902  BP: (!) 126/90  Pulse: 94  Weight: 211 lb 11.2 oz (96 kg)    Fetal Status: Fetal  Heart Rate (bpm): 140 Fundal Height: 27 cm Movement: Present     General:  Alert, oriented and cooperative. Patient is in no acute distress.  Skin: Skin is warm and dry. No rash noted.   Cardiovascular: Normal heart rate noted  Respiratory: Normal respiratory effort, no problems with respiration noted  Abdomen: Soft, gravid, appropriate for gestational age.  Pain/Pressure: Present     Pelvic: Cervical exam deferred        Extremities: Normal range of motion.  Edema: None  Mental Status:  Normal mood and affect. Normal behavior. Normal judgment and thought content.   Assessment and Plan:  Pregnancy: [id number] at [redacted]w[redacted]d  1. Supervision of high risk pregnancy, antepartum Continue routine prenatal care 2 hour GTT - Tdap vaccine greater than or equal to 7yo IM  2. Cystic fibrosis carrier   3. [redacted] weeks gestation of pregnancy   4. Chronic hypertension Decent control with labetalol  5. Hx of preeclampsia, prior pregnancy, currently pregnant No s/sx of preeclampsia.  Pt compliant with baby ASA  6. History of cervical cerclage No cerclage at this time, last CL well above threshold, 6 cm, length seems abnormally loing but above threshold.  7. History of pulmonary embolus (PE) Pt notes occasionally missing lovenox injections.  Pt asked for improved compliance due to her increased risk of event  8. History of macrosomia in infant in prior pregnancy, currently pregnant   9. History of C-section See above for TOLAC discussion, pt has consent to review, anticipate decision at next visit  Preterm labor symptoms and general obstetric precautions including but not limited to vaginal bleeding, contractions, leaking of fluid and fetal movement were reviewed in detail with the patient.  Please refer to After Visit Summary for other counseling recommendations.   Return in about 2 weeks (around 11/02/2021) for [facility], in person.   Mariel Aloe, MD Faculty Attending Center for [facility]

## 2021-10-20 LAB — CBC
Hematocrit: 32.8 % — ABNORMAL LOW (ref 34.0–46.6)
Hemoglobin: 10.6 g/dL — ABNORMAL LOW (ref 11.1–15.9)
MCH: 28.1 pg (ref 26.6–33.0)
MCHC: 32.3 g/dL (ref 31.5–35.7)
MCV: 87 fL (ref 79–97)
Platelets: 376 10*3/uL (ref 150–450)
RBC: 3.77 x10E6/uL (ref 3.77–5.28)
RDW: 12.4 % (ref 11.7–15.4)
WBC: 10.7 10*3/uL (ref 3.4–10.8)

## 2021-10-20 LAB — HIV ANTIBODY (ROUTINE TESTING W REFLEX): HIV Screen 4th Generation wRfx: NONREACTIVE

## 2021-10-20 LAB — RPR: RPR Ser Ql: NONREACTIVE

## 2021-10-20 LAB — GLUCOSE TOLERANCE, 2 HOURS W/ 1HR
Glucose, 1 hour: 156 mg/dL (ref 70–179)
Glucose, 2 hour: 149 mg/dL (ref 70–152)
Glucose, Fasting: 87 mg/dL (ref 70–91)

## 2021-10-27 ENCOUNTER — Ambulatory Visit: Payer: No Typology Code available for payment source | Admitting: *Deleted

## 2021-10-27 ENCOUNTER — Ambulatory Visit: Payer: No Typology Code available for payment source | Attending: Maternal & Fetal Medicine

## 2021-10-27 ENCOUNTER — Other Ambulatory Visit: Payer: Self-pay | Admitting: *Deleted

## 2021-10-27 VITALS — BP 129/62 | HR 86

## 2021-10-27 DIAGNOSIS — Z3A28 28 weeks gestation of pregnancy: Secondary | ICD-10-CM

## 2021-10-27 DIAGNOSIS — O34219 Maternal care for unspecified type scar from previous cesarean delivery: Secondary | ICD-10-CM | POA: Insufficient documentation

## 2021-10-27 DIAGNOSIS — Z141 Cystic fibrosis carrier: Secondary | ICD-10-CM

## 2021-10-27 DIAGNOSIS — O3433 Maternal care for cervical incompetence, third trimester: Secondary | ICD-10-CM | POA: Diagnosis not present

## 2021-10-27 DIAGNOSIS — O285 Abnormal chromosomal and genetic finding on antenatal screening of mother: Secondary | ICD-10-CM

## 2021-10-27 DIAGNOSIS — O09299 Supervision of pregnancy with other poor reproductive or obstetric history, unspecified trimester: Secondary | ICD-10-CM

## 2021-10-27 DIAGNOSIS — F17210 Nicotine dependence, cigarettes, uncomplicated: Secondary | ICD-10-CM

## 2021-10-27 DIAGNOSIS — O0990 Supervision of high risk pregnancy, unspecified, unspecified trimester: Secondary | ICD-10-CM

## 2021-10-27 DIAGNOSIS — O09892 Supervision of other high risk pregnancies, second trimester: Secondary | ICD-10-CM | POA: Insufficient documentation

## 2021-10-27 DIAGNOSIS — O99891 Other specified diseases and conditions complicating pregnancy: Secondary | ICD-10-CM

## 2021-10-27 DIAGNOSIS — O09213 Supervision of pregnancy with history of pre-term labor, third trimester: Secondary | ICD-10-CM

## 2021-10-27 DIAGNOSIS — O10912 Unspecified pre-existing hypertension complicating pregnancy, second trimester: Secondary | ICD-10-CM | POA: Diagnosis present

## 2021-10-27 DIAGNOSIS — O09293 Supervision of pregnancy with other poor reproductive or obstetric history, third trimester: Secondary | ICD-10-CM

## 2021-10-27 DIAGNOSIS — O10913 Unspecified pre-existing hypertension complicating pregnancy, third trimester: Secondary | ICD-10-CM

## 2021-10-27 DIAGNOSIS — O99333 Smoking (tobacco) complicating pregnancy, third trimester: Secondary | ICD-10-CM

## 2021-10-27 DIAGNOSIS — O10013 Pre-existing essential hypertension complicating pregnancy, third trimester: Secondary | ICD-10-CM

## 2021-10-27 DIAGNOSIS — O3430 Maternal care for cervical incompetence, unspecified trimester: Secondary | ICD-10-CM | POA: Diagnosis present

## 2021-10-27 DIAGNOSIS — Z86711 Personal history of pulmonary embolism: Secondary | ICD-10-CM

## 2021-10-27 DIAGNOSIS — D573 Sickle-cell trait: Secondary | ICD-10-CM

## 2021-11-07 ENCOUNTER — Other Ambulatory Visit: Payer: Self-pay

## 2021-11-07 ENCOUNTER — Ambulatory Visit (INDEPENDENT_AMBULATORY_CARE_PROVIDER_SITE_OTHER): Payer: No Typology Code available for payment source | Admitting: Family Medicine

## 2021-11-07 VITALS — BP 129/72 | HR 98 | Wt 217.0 lb

## 2021-11-07 DIAGNOSIS — Z141 Cystic fibrosis carrier: Secondary | ICD-10-CM

## 2021-11-07 DIAGNOSIS — O09299 Supervision of pregnancy with other poor reproductive or obstetric history, unspecified trimester: Secondary | ICD-10-CM

## 2021-11-07 DIAGNOSIS — Z9889 Other specified postprocedural states: Secondary | ICD-10-CM

## 2021-11-07 DIAGNOSIS — Z98891 History of uterine scar from previous surgery: Secondary | ICD-10-CM

## 2021-11-07 DIAGNOSIS — O0990 Supervision of high risk pregnancy, unspecified, unspecified trimester: Secondary | ICD-10-CM

## 2021-11-07 DIAGNOSIS — I10 Essential (primary) hypertension: Secondary | ICD-10-CM

## 2021-11-07 DIAGNOSIS — Z86711 Personal history of pulmonary embolism: Secondary | ICD-10-CM

## 2021-11-07 LAB — POCT URINALYSIS DIP (DEVICE)
Bilirubin Urine: NEGATIVE
Glucose, UA: NEGATIVE mg/dL
Ketones, ur: NEGATIVE mg/dL
Leukocytes,Ua: NEGATIVE
Nitrite: NEGATIVE
Protein, ur: 30 mg/dL — AB
Specific Gravity, Urine: 1.025 (ref 1.005–1.030)
Urobilinogen, UA: 0.2 mg/dL (ref 0.0–1.0)
pH: 7 (ref 5.0–8.0)

## 2021-11-07 MED ORDER — LABETALOL HCL 200 MG PO TABS: 200.0000 mg | ORAL_TABLET | Freq: Three times a day (TID) | ORAL | 6 refills | Status: DC

## 2021-11-07 NOTE — Progress Notes (Signed)
PRENATAL VISIT NOTE  Subjective:  Kimberly Stark is a 31 y.o. [id number] at [redacted]w[redacted]d being seen today for ongoing prenatal care.  She is currently monitored for the following issues for this high-risk pregnancy and has Hx of preeclampsia, prior pregnancy, currently pregnant; Supervision of high risk pregnancy, antepartum; History of cervical cerclage; History of pulmonary embolus (PE); History of macrosomia in infant in prior pregnancy, currently pregnant; Tobacco abuse; History of C-section; Cystic fibrosis carrier; Chronic hypertension; Migraine with typical aura; Mild dysplasia of cervix (CIN I); Insomnia; Glaucoma; and Chronic post-traumatic stress disorder on their problem list.  Patient reports no bleeding, no contractions, no cramping, no leaking, and vaginal pressure as well as occasional headache which resolves after she takes her blood pressure medication.  She reports that she has swelling in her lower extremities which comes and goes.  Denies any changes in vision or abdominal pain. .  Contractions: Not present. Vag. Bleeding: None.  Movement: Present. Denies leaking of fluid.   The following portions of the patient's history were reviewed and updated as appropriate: allergies, current medications, past family history, past medical history, past social history, past surgical history and problem list.   Objective:   Vitals:   11/07/21 0824  BP: 129/72  Pulse: 98  Weight: 217 lb (98.4 kg)    Fetal Status: Fetal Heart Rate (bpm): 145   Movement: Present     General:  Alert, oriented and cooperative. Patient is in no acute distress.  Skin: Skin is warm and dry. No rash noted.   Cardiovascular: Normal heart rate noted  Respiratory: Normal respiratory effort, no problems with respiration noted  Abdomen: Soft, gravid, appropriate for gestational age.  Pain/Pressure: Absent     Pelvic: Cervical exam performed in the presence of a chaperone      closed thick -1  Extremities: Normal range  of motion.     Mental Status: Normal mood and affect. Normal behavior. Normal judgment and thought content.   Assessment and Plan:  Pregnancy: [id number] at [redacted]w[redacted]d 1. Supervision of high risk pregnancy, antepartum 2-hour glucose tolerance test within normal limits at previous visit.  RPR and HIV also negative.  Continue routine prenatal care.  2. Cystic fibrosis carrier  3. Chronic hypertension Patient's blood pressure well controlled at today's visit.  Reports that her blood pressure typically does well in the morning but elevates in the afternoon up to 160s/90s.  She will get headaches.  It remained as high until she takes her next dose of her labetalol between 5 and 6 PM. - Urine dip today showing protein of 30 mcg/dL.  Urine protein creatinine sent today.  Discussed signs and symptoms of preeclampsia and strict MAU precautions. - Increased labetalol dosing to 3 times daily - Blood pressure check in 1 week  4. Hx of preeclampsia, prior pregnancy, currently pregnant Currently on baby aspirin with good compliance.  5. History of cervical cerclage No cerclage at this time.  6. History of pulmonary embolus (PE) Currently on Lovenox 40 mg daily.  Patient reports that she is overall compliant and has missed 1 dose in the last week.  7. History of macrosomia in infant in prior pregnancy, currently pregnant  8. History of C-section TOLAC discussion held at this visit.  Patient has elected for repeat cesarean section.  To be scheduled at a future visit.  Preterm labor symptoms and general obstetric precautions including but not limited to vaginal bleeding, contractions, leaking of fluid and fetal movement were reviewed in detail with  the patient. Please refer to After Visit Summary for other counseling recommendations.   No follow-ups on file.  Future Appointments  Date Time Provider Department Center  11/24/2021 11:15 AM [facility] NURSE [facility]  11/24/2021 11:30 AM [facility] US3 [facility]  [facility]  12/01/2021  1:30 PM [facility] NURSE [facility]  12/01/2021  1:45 PM [facility] US4 [facility]  12/08/2021  1:30 PM [facility] NURSE [facility]  12/08/2021  1:45 PM [facility] US4 [facility]  12/15/2021  1:30 PM [facility] NURSE [facility]  12/15/2021  1:45 PM [facility] US4 [facility]    Celedonio Savage, MD

## 2021-11-07 NOTE — Progress Notes (Signed)
Patient wants iron levels checked due to her "being out of breath more than usual"

## 2021-11-08 ENCOUNTER — Encounter (HOSPITAL_COMMUNITY): Payer: Self-pay | Admitting: Obstetrics and Gynecology

## 2021-11-08 ENCOUNTER — Inpatient Hospital Stay (HOSPITAL_COMMUNITY)
Admission: AD | Admit: 2021-11-08 | Discharge: 2021-11-08 | Disposition: A | Discharge status: Home / Self Care | Payer: No Typology Code available for payment source | Attending: Obstetrics and Gynecology | Admitting: Obstetrics and Gynecology

## 2021-11-08 DIAGNOSIS — O09299 Supervision of pregnancy with other poor reproductive or obstetric history, unspecified trimester: Secondary | ICD-10-CM

## 2021-11-08 DIAGNOSIS — O10913 Unspecified pre-existing hypertension complicating pregnancy, third trimester: Secondary | ICD-10-CM | POA: Diagnosis present

## 2021-11-08 DIAGNOSIS — Z3A29 29 weeks gestation of pregnancy: Secondary | ICD-10-CM | POA: Diagnosis not present

## 2021-11-08 DIAGNOSIS — O0990 Supervision of high risk pregnancy, unspecified, unspecified trimester: Secondary | ICD-10-CM

## 2021-11-08 DIAGNOSIS — O1403 Mild to moderate pre-eclampsia, third trimester: Secondary | ICD-10-CM | POA: Diagnosis not present

## 2021-11-08 LAB — CBC WITH DIFFERENTIAL/PLATELET
Abs Immature Granulocytes: 0.37 10*3/uL — ABNORMAL HIGH (ref 0.00–0.07)
Basophils Absolute: 0 10*3/uL (ref 0.0–0.1)
Basophils Relative: 0 %
Eosinophils Absolute: 0.2 10*3/uL (ref 0.0–0.5)
Eosinophils Relative: 2 %
HCT: 30.7 % — ABNORMAL LOW (ref 36.0–46.0)
Hemoglobin: 10.1 g/dL — ABNORMAL LOW (ref 12.0–15.0)
Immature Granulocytes: 3 %
Lymphocytes Relative: 20 %
Lymphs Abs: 2.5 10*3/uL (ref 0.7–4.0)
MCH: 28.1 pg (ref 26.0–34.0)
MCHC: 32.9 g/dL (ref 30.0–36.0)
MCV: 85.3 fL (ref 80.0–100.0)
Monocytes Absolute: 1.1 10*3/uL — ABNORMAL HIGH (ref 0.1–1.0)
Monocytes Relative: 8 %
Neutro Abs: 8.6 10*3/uL — ABNORMAL HIGH (ref 1.7–7.7)
Neutrophils Relative %: 67 %
Platelets: 373 10*3/uL (ref 150–400)
RBC: 3.6 MIL/uL — ABNORMAL LOW (ref 3.87–5.11)
RDW: 13.5 % (ref 11.5–15.5)
WBC: 12.8 10*3/uL — ABNORMAL HIGH (ref 4.0–10.5)
nRBC: 0.5 % — ABNORMAL HIGH (ref 0.0–0.2)

## 2021-11-08 LAB — URINALYSIS, ROUTINE W REFLEX MICROSCOPIC
Bilirubin Urine: NEGATIVE
Glucose, UA: NEGATIVE mg/dL
Hgb urine dipstick: NEGATIVE
Ketones, ur: NEGATIVE mg/dL
Leukocytes,Ua: NEGATIVE
Nitrite: NEGATIVE
Protein, ur: 30 mg/dL — AB
Specific Gravity, Urine: 1.015 (ref 1.005–1.030)
pH: 6 (ref 5.0–8.0)

## 2021-11-08 LAB — COMPREHENSIVE METABOLIC PANEL
ALT: 13 U/L (ref 0–44)
AST: 19 U/L (ref 15–41)
Albumin: 3.1 g/dL — ABNORMAL LOW (ref 3.5–5.0)
Alkaline Phosphatase: 82 U/L (ref 38–126)
Anion gap: 9 (ref 5–15)
BUN: <5 mg/dL — ABNORMAL LOW (ref 6–20)
CO2: 20 mmol/L — ABNORMAL LOW (ref 22–32)
Calcium: 9.3 mg/dL (ref 8.9–10.3)
Chloride: 108 mmol/L (ref 98–111)
Creatinine, Ser: 0.52 mg/dL (ref 0.44–1.00)
GFR, Estimated: >60 mL/min (ref >60)
Glucose, Bld: 100 mg/dL — ABNORMAL HIGH (ref 70–99)
Potassium: 3.2 mmol/L — ABNORMAL LOW (ref 3.5–5.1)
Sodium: 137 mmol/L (ref 135–145)
Total Bilirubin: 0.3 mg/dL (ref 0.3–1.2)
Total Protein: 5.9 g/dL — ABNORMAL LOW (ref 6.5–8.1)

## 2021-11-08 LAB — PROTEIN / CREATININE RATIO, URINE
Creatinine, Urine: 117.5 mg/dL
Creatinine, Urine: 125 mg/dL
Protein Creatinine Ratio: 0.4 mg/mg{Cre} — ABNORMAL HIGH (ref 0.00–0.15)
Protein, Ur: 48.3 mg/dL
Protein/Creat Ratio: 411 mg/g creat — ABNORMAL HIGH (ref 0–200)
Total Protein, Urine: 50 mg/dL

## 2021-11-08 MED ORDER — LABETALOL HCL 5 MG/ML IV SOLN
20.0000 mg | INTRAVENOUS | Status: DC | PRN
  Administered 2021-11-08: 20 mg via INTRAVENOUS
  Filled 2021-11-08: qty 4

## 2021-11-08 MED ORDER — HYDRALAZINE HCL 20 MG/ML IJ SOLN: 10.0000 mg | INTRAMUSCULAR | Status: DC | PRN

## 2021-11-08 MED ORDER — LABETALOL HCL 5 MG/ML IV SOLN: 40.0000 mg | INTRAVENOUS | Status: DC | PRN

## 2021-11-08 MED ORDER — LABETALOL HCL 300 MG PO TABS: 300.0000 mg | ORAL_TABLET | Freq: Three times a day (TID) | ORAL | 1 refills | Status: DC

## 2021-11-08 MED ORDER — LABETALOL HCL 5 MG/ML IV SOLN: 80.0000 mg | INTRAVENOUS | Status: DC | PRN

## 2021-11-08 NOTE — MAU Provider Note (Signed)
History     CSN: [id number]  Arrival date and time: 11/08/21 1728   Event Date/Time   First Provider Initiated Contact with Patient 11/08/21 1804      Chief Complaint  Patient presents with   Hypertension   Headache   Kimberly Stark is a 31 y.o. [id number] at [redacted]w[redacted]d who presents today with hypertension. She has CHTN and is on labetalol. She was seen at her OB visit yesterday and labetalol was increased from BID to TID. She also had labs done yesterday. P:Cr came back at 411, so provider called her today to review results. At that time patient reported that she was having severe range blood pressures at home. So it was advised she come in to be seen. She reports a headache today, but feels this is from stress and worrying about her blood pressure. She rates it a 3/10 at this time. She denies any contractions, VB or LOF. She reports normal fetal movement.  Pregnancy history is significant for pre-eclampsia x2 and PTD due to pre-eclampsia at 30 weeks and 34 weeks.   Hypertension Associated symptoms include headaches.  Headache  Her past medical history is significant for hypertension.    OB History     Gravida  11   Para  2   Term  0   Preterm  2   AB  8   Living  2      SAB  7   IAB  1   Ectopic  0   Multiple  0   Live Births  2        Obstetric Comments  Pre-E with both preg, induced with both         Past Medical History:  Diagnosis Date   Asthma    Bipolar 1 disorder (HCC)    Pregnancy induced hypertension    Preterm labor    UTI (urinary tract infection)     Past Surgical History:  Procedure Laterality Date   CERVICAL BIOPSY  W/ LOOP ELECTRODE EXCISION     CERVICAL CERCLAGE     CESAREAN SECTION     DILATION AND CURETTAGE OF UTERUS      Family History  Problem Relation Age of Onset   Stroke Mother    Heart disease Mother        hx open heart issues   Hypertension Mother    Diabetes Mother    Cancer Father        started as colon,  w/mets   Kidney disease Father        renal failure   Diabetes Father    Cancer Maternal Grandmother    Diabetes Paternal Grandmother     Social History   Tobacco Use   Smoking status: Some Days    Packs/day: 0.25    Types: Cigarettes   Smokeless tobacco: Never  Vaping Use   Vaping Use: Former   Substances: Nicotine  Substance Use Topics   Alcohol use: Not Currently   Drug use: Not Currently    Allergies:  Allergies  Allergen Reactions   Latex Anaphylaxis   Shellfish Allergy Anaphylaxis   Iodine Hives   Penicillins Hives    Medications Prior to Admission  Medication Sig Dispense Refill Last Dose   aspirin EC 81 MG tablet Take 1 tablet (81 mg total) by mouth daily. Swallow whole. 30 tablet 11 11/08/2021   Cholecalciferol (VITAMIN D3 PO) Take by mouth.   11/08/2021   enoxaparin (LOVENOX) 40 MG/0.4ML injection Inject 0.4  mLs (40 mg total) into the skin daily. 12 mL 8 11/07/2021   famotidine (PEPCID) 20 MG tablet Take 1 tablet (20 mg total) by mouth 2 (two) times daily. 60 tablet 2 11/08/2021   ferrous sulfate 325 (65 FE) MG EC tablet Take 325 mg by mouth 3 (three) times daily with meals.   11/08/2021   labetalol (NORMODYNE) 200 MG tablet Take 1 tablet (200 mg total) by mouth 3 (three) times daily. 90 tablet 6 11/08/2021   nicotine polacrilex (COMMIT) 2 MG lozenge DISSOLVE 1 LOZENGE IN MOUTH EVERY FOUR HOURS AS NEEDED   11/08/2021   ondansetron (ZOFRAN) 4 MG tablet Take 1 tablet (4 mg total) by mouth every 8 (eight) hours as needed for nausea or vomiting. 20 tablet 0 11/08/2021   Prenatal MV & Min w/FA-DHA (PRENATAL GUMMIES PO) Take by mouth.   11/08/2021   sertraline (ZOLOFT) 100 MG tablet TAKE ONE-HALF TABLET BY MOUTH IN THE MORNING FOR 3 DAYS, THEN TAKE ONE TABLET IN THE MORNING FOR 30 DAYS FOR MENTAL HEALTH       Review of Systems  Neurological:  Positive for headaches.  All other systems reviewed and are negative.  Physical Exam   Blood pressure 139/73, pulse 86,  temperature 98.1 F (36.7 C), temperature source Oral, resp. rate 20, height 5\' 4"  (1.626 m), weight 102.1 kg, last menstrual period 03/22/2021, SpO2 97 %.  Physical Exam Constitutional:      Appearance: She is well-developed.  HENT:     Head: Normocephalic.  Eyes:     Pupils: Pupils are equal, round, and reactive to light.  Cardiovascular:     Rate and Rhythm: Normal rate and regular rhythm.     Heart sounds: Normal heart sounds.  Pulmonary:     Effort: Pulmonary effort is normal. No respiratory distress.     Breath sounds: Normal breath sounds.  Abdominal:     Palpations: Abdomen is soft.     Tenderness: There is no abdominal tenderness.  Genitourinary:    Vagina: No bleeding. Vaginal discharge: mucusy.    Comments: External: no lesion Vagina: small amount of white discharge     Musculoskeletal:        General: Normal range of motion.     Cervical back: Normal range of motion and neck supple.  Skin:    General: Skin is warm and dry.  Neurological:     Mental Status: She is alert and oriented to person, place, and time.  Psychiatric:        Mood and Affect: Mood normal.        Behavior: Behavior normal.    NST:  Baseline: 135 Variability: moderate Accels: 15x15 Decels: none Toco: none Reactive/Appropriate for GA  Patient Vitals for the past 24 hrs:  BP Temp Temp src Pulse Resp SpO2 Height Weight  11/08/21 2000 139/73 -- -- 86 -- 97 % -- --  11/08/21 1945 (!) 142/69 -- -- 83 -- 97 % -- --  11/08/21 1930 125/64 -- -- 90 -- 98 % -- --  11/08/21 1915 (!) 140/74 -- -- 83 -- 99 % -- --  11/08/21 1900 (!) 145/81 -- -- 90 -- 99 % -- --  11/08/21 1845 131/73 -- -- 96 -- 99 % -- --  11/08/21 1831 (!) 142/73 -- -- 91 -- -- -- --  11/08/21 1801 (!) 173/74 -- -- 96 -- -- -- --  11/08/21 1749 (!) 179/71 98.1 F (36.7 C) Oral (!) 101 20 99 % 5\' 4"  (  1.626 m) 102.1 kg    Results for orders placed or performed during the hospital encounter of 11/08/21 (from the past 24  hour(s))  Urinalysis, Routine w reflex microscopic     Status: Abnormal   Collection Time: 11/08/21  6:17 PM  Result Value Ref Range   Color, Urine YELLOW YELLOW   APPearance HAZY (A) CLEAR   Specific Gravity, Urine 1.015 1.005 - 1.030   pH 6.0 5.0 - 8.0   Glucose, UA NEGATIVE NEGATIVE mg/dL   Hgb urine dipstick NEGATIVE NEGATIVE   Bilirubin Urine NEGATIVE NEGATIVE   Ketones, ur NEGATIVE NEGATIVE mg/dL   Protein, ur 30 (A) NEGATIVE mg/dL   Nitrite NEGATIVE NEGATIVE   Leukocytes,Ua NEGATIVE NEGATIVE   RBC / HPF 0-5 0 - 5 RBC/hpf   WBC, UA 0-5 0 - 5 WBC/hpf   Bacteria, UA RARE (A) NONE SEEN   Squamous Epithelial / LPF 11-20 0 - 5   Mucus PRESENT    Ca Oxalate Crys, UA PRESENT   Comprehensive metabolic panel     Status: Abnormal   Collection Time: 11/08/21  6:17 PM  Result Value Ref Range   Sodium 137 135 - 145 mmol/L   Potassium 3.2 (L) 3.5 - 5.1 mmol/L   Chloride 108 98 - 111 mmol/L   CO2 20 (L) 22 - 32 mmol/L   Glucose, Bld 100 (H) 70 - 99 mg/dL   BUN <5 (L) 6 - 20 mg/dL   Creatinine, Ser 7.51 0.44 - 1.00 mg/dL   Calcium 9.3 8.9 - 02.5 mg/dL   Total Protein 5.9 (L) 6.5 - 8.1 g/dL   Albumin 3.1 (L) 3.5 - 5.0 g/dL   AST 19 15 - 41 U/L   ALT 13 0 - 44 U/L   Alkaline Phosphatase 82 38 - 126 U/L   Total Bilirubin 0.3 0.3 - 1.2 mg/dL   GFR, Estimated >85 >27 mL/min   Anion gap 9 5 - 15  CBC with Differential/Platelet     Status: Abnormal   Collection Time: 11/08/21  6:17 PM  Result Value Ref Range   WBC 12.8 (H) 4.0 - 10.5 K/uL   RBC 3.60 (L) 3.87 - 5.11 MIL/uL   Hemoglobin 10.1 (L) 12.0 - 15.0 g/dL   HCT 78.2 (L) 42.3 - 53.6 %   MCV 85.3 80.0 - 100.0 fL   MCH 28.1 26.0 - 34.0 pg   MCHC 32.9 30.0 - 36.0 g/dL   RDW 14.4 31.5 - 40.0 %   Platelets 373 150 - 400 K/uL   nRBC 0.5 (H) 0.0 - 0.2 %   Neutrophils Relative % 67 %   Neutro Abs 8.6 (H) 1.7 - 7.7 K/uL   Lymphocytes Relative 20 %   Lymphs Abs 2.5 0.7 - 4.0 K/uL   Monocytes Relative 8 %   Monocytes Absolute 1.1  (H) 0.1 - 1.0 K/uL   Eosinophils Relative 2 %   Eosinophils Absolute 0.2 0.0 - 0.5 K/uL   Basophils Relative 0 %   Basophils Absolute 0.0 0.0 - 0.1 K/uL   Immature Granulocytes 3 %   Abs Immature Granulocytes 0.37 (H) 0.00 - 0.07 K/uL  Protein / creatinine ratio, urine     Status: Abnormal   Collection Time: 11/08/21  6:17 PM  Result Value Ref Range   Creatinine, Urine 125 mg/dL   Total Protein, Urine 50 mg/dL   Protein Creatinine Ratio 0.40 (H) 0.00 - 0.15 mg/mg[Cre]     MAU Course  Procedures  MDM 1816: 20mg  labetalol  IV given  839pm: DW with Dr. Jolayne Panther, patient doesn't need inpatient care at this time however does need close outpatient FU. Message clinic to see if we can get in with a provider on 9/5 or at least sooner 9/22. Increase to labetalol to 300mg  TID and bring cuff to next visit   Assessment and Plan   1. Mild pre-eclampsia in third trimester   2. [redacted] weeks gestation of pregnancy   3. Chronic hypertension with exacerbation during pregnancy in third trimester    DC home in stable condition  3rd Trimester precautions  Pre-eclampsia warning signs  PTL precautions  Fetal kick counts RX: labetalol 300mg  TID  Return to MAU as needed FU with OB as planned   Follow-up Information     [facility] Follow up.   Specialty: Obstetrics and Gynecology Why: As scheduled Contact information: [address] [place] [address] [phone]               [phone] DNP, CNM  11/08/21  8:47 PM

## 2021-11-08 NOTE — MAU Note (Signed)
.  Kimberly Stark is a 31 y.o. at [redacted]w[redacted]d here in MAU reporting: that she was sent over from the [facility] office because of her labs. Pt reports CHTN and takes Procardia and baby aspirin. She does report HA 4/10 she did take Tylenol around 1530 with no relief. She reports burning epigastric pain but says that it does not feel like heartburn 7/10 . Denies visual changes. She also reports BP readings of 164/67 and 167/80 at home. Denies VB or LOF. Reports good FM.   Onset of complaint: today Pain score: see note Vitals:   11/08/21 1749  BP: (!) 179/71  Pulse: (!) 101  Resp: 20  Temp: 98.1 F (36.7 C)  SpO2: 99%     FHT:140 Lab orders placed from triage:  UA

## 2021-11-14 ENCOUNTER — Encounter: Payer: Self-pay | Admitting: Obstetrics & Gynecology

## 2021-11-14 ENCOUNTER — Encounter: Payer: Self-pay | Admitting: Obstetrics and Gynecology

## 2021-11-14 ENCOUNTER — Telehealth: Payer: Medicaid Other | Admitting: Physician Assistant

## 2021-11-14 DIAGNOSIS — Z3A30 30 weeks gestation of pregnancy: Secondary | ICD-10-CM

## 2021-11-14 NOTE — Patient Instructions (Signed)
  Kimberly Stark, thank you for joining Margaretann Loveless, PA-C for today's virtual visit.  While this provider is not your primary care provider (PCP), if your PCP is located in our provider database this encounter information will be shared with them immediately following your visit.  Consent: (Patient) Kimberly Stark provided verbal consent for this virtual visit at the beginning of the encounter.  Current Medications:  Current Outpatient Medications:    aspirin EC 81 MG tablet, Take 1 tablet (81 mg total) by mouth daily. Swallow whole., Disp: 30 tablet, Rfl: 11   Cholecalciferol (VITAMIN D3 PO), Take by mouth., Disp: , Rfl:    enoxaparin (LOVENOX) 40 MG/0.4ML injection, Inject 0.4 mLs (40 mg total) into the skin daily., Disp: 12 mL, Rfl: 8   famotidine (PEPCID) 20 MG tablet, Take 1 tablet (20 mg total) by mouth 2 (two) times daily., Disp: 60 tablet, Rfl: 2   ferrous sulfate 325 (65 FE) MG EC tablet, Take 325 mg by mouth 3 (three) times daily with meals., Disp: , Rfl:    labetalol (NORMODYNE) 300 MG tablet, Take 1 tablet (300 mg total) by mouth 3 (three) times daily., Disp: 90 tablet, Rfl: 1   nicotine polacrilex (COMMIT) 2 MG lozenge, DISSOLVE 1 LOZENGE IN MOUTH EVERY FOUR HOURS AS NEEDED, Disp: , Rfl:    ondansetron (ZOFRAN) 4 MG tablet, Take 1 tablet (4 mg total) by mouth every 8 (eight) hours as needed for nausea or vomiting., Disp: 20 tablet, Rfl: 0   Prenatal MV & Min w/FA-DHA (PRENATAL GUMMIES PO), Take by mouth., Disp: , Rfl:    sertraline (ZOLOFT) 100 MG tablet, TAKE ONE-HALF TABLET BY MOUTH IN THE MORNING FOR 3 DAYS, THEN TAKE ONE TABLET IN THE MORNING FOR 30 DAYS FOR MENTAL HEALTH, Disp: , Rfl:    Medications ordered in this encounter:  No orders of the defined types were placed in this encounter.    *If you need refills on other medications prior to your next appointment, please contact your pharmacy*  Follow-Up: Call back or seek an in-person evaluation if the symptoms  worsen or if the condition fails to improve as anticipated.  Other Instructions  - Since she is only 30 weeks and this could be early signs of cervical dilation I have advised her to call the after hours line for her OB/GYN to discuss if they may can evaluate her in the next 24 hours  - If she is unable to get in contact with them she should proceed to the MAU for further evaluation   If you have been instructed to have an in-person evaluation today at a local Urgent Care facility, please use the link below. It will take you to a list of all of our available [facility], including address, phone number and hours of operation. Please do not delay care.  [facility]  If you or a family member do not have a primary care provider, use the link below to schedule a visit and establish care. When you choose a [facility] primary care physician or advanced practice provider, you gain a long-term partner in health. Find a Primary Care Provider  Learn more about [facility] in-office and virtual care options: [facility] - Get Care Now

## 2021-11-14 NOTE — Progress Notes (Signed)
Virtual Visit Consent   Kimberly Stark, you are scheduled for a virtual visit with a [facility] provider today. Just as with appointments in the office, your consent must be obtained to participate. Your consent will be active for this visit and any virtual visit you may have with one of our providers in the next 365 days. If you have a MyChart account, a copy of this consent can be sent to you electronically.  As this is a virtual visit, video technology does not allow for your provider to perform a traditional examination. This may limit your provider's ability to fully assess your condition. If your provider identifies any concerns that need to be evaluated in person or the need to arrange testing (such as labs, EKG, etc.), we will make arrangements to do so. Although advances in technology are sophisticated, we cannot ensure that it will always work on either your end or our end. If the connection with a video visit is poor, the visit may have to be switched to a telephone visit. With either a video or telephone visit, we are not always able to ensure that we have a secure connection.  By engaging in this virtual visit, you consent to the provision of healthcare and authorize for your insurance to be billed (if applicable) for the services provided during this visit. Depending on your insurance coverage, you may receive a charge related to this service.  I need to obtain your verbal consent now. Are you willing to proceed with your visit today? Kimberly Stark has provided verbal consent on 11/14/2021 for a virtual visit (video or telephone). Kimberly Loveless, PA-C  Date: 11/14/2021 2:25 PM  Virtual Visit via Video Note   I, Kimberly Stark, connected with  Kimberly Stark  ([id number], Sep 05, 1990) on 11/14/21 at  2:15 PM EDT by a video-enabled telemedicine application and verified that I am speaking with the correct person using two identifiers.  Location: Patient: Virtual Visit Location Patient:  Home Provider: Virtual Visit Location Provider: Home Office   I discussed the limitations of evaluation and management by telemedicine and the availability of in person appointments. The patient expressed understanding and agreed to proceed.    History of Present Illness: Kimberly Stark is a 31 y.o. who identifies as a female who was assigned female at birth, and is being seen today for possible loss of her mucus plug at [redacted] weeks gestation. Reports she went to the restroom and had a yellow mucus come continuously out with wiping. She denies any abdominal pain, cramping, or other labor symptoms.    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   History of pulmonary embolus (PE) 06/22/2021   History of macrosomia in infant in prior pregnancy, currently pregnant 06/22/2021   Tobacco abuse 06/22/2021   History of C-section 06/22/2021   Hx of preeclampsia, prior pregnancy, currently pregnant 06/09/2021   Supervision of high risk pregnancy, antepartum 06/09/2021   History of cervical cerclage 06/09/2021    Allergies:  Allergies  Allergen Reactions   Latex Anaphylaxis   Shellfish Allergy Anaphylaxis   Iodine Hives   Penicillins Hives   Medications:  Current Outpatient Medications:    aspirin EC 81 MG tablet, Take 1 tablet (81 mg  total) by mouth daily. Swallow whole., Disp: 30 tablet, Rfl: 11   Cholecalciferol (VITAMIN D3 PO), Take by mouth., Disp: , Rfl:    enoxaparin (LOVENOX) 40 MG/0.4ML injection, Inject 0.4 mLs (40 mg total) into the skin daily., Disp: 12 mL, Rfl: 8   famotidine (PEPCID) 20 MG tablet, Take 1 tablet (20 mg total) by mouth 2 (two) times daily., Disp: 60 tablet, Rfl: 2   ferrous sulfate 325 (65 FE) MG  EC tablet, Take 325 mg by mouth 3 (three) times daily with meals., Disp: , Rfl:    labetalol (NORMODYNE) 300 MG tablet, Take 1 tablet (300 mg total) by mouth 3 (three) times daily., Disp: 90 tablet, Rfl: 1   nicotine polacrilex (COMMIT) 2 MG lozenge, DISSOLVE 1 LOZENGE IN MOUTH EVERY FOUR HOURS AS NEEDED, Disp: , Rfl:    ondansetron (ZOFRAN) 4 MG tablet, Take 1 tablet (4 mg total) by mouth every 8 (eight) hours as needed for nausea or vomiting., Disp: 20 tablet, Rfl: 0   Prenatal MV & Min w/FA-DHA (PRENATAL GUMMIES PO), Take by mouth., Disp: , Rfl:    sertraline (ZOLOFT) 100 MG tablet, TAKE ONE-HALF TABLET BY MOUTH IN THE MORNING FOR 3 DAYS, THEN TAKE ONE TABLET IN THE MORNING FOR 30 DAYS FOR MENTAL HEALTH, Disp: , Rfl:   Observations/Objective: Patient is well-developed, well-nourished in no acute distress.  Resting comfortably at home.  Head is normocephalic, atraumatic.  No labored breathing.  Speech is clear and coherent with logical content.  Patient is alert and oriented at baseline.    Assessment and Plan: 1. [redacted] weeks gestation of pregnancy  - Discussed this does sound possibly like the mucus plug coming out - Since she is only 30 weeks and this could be early signs of cervical dilation I have advised her to call the after hours line for her OB/GYN to discuss if they may can evaluate her in the next 24 hours  - If she is unable to get in contact with them she should proceed to the MAU for further evaluation  Follow Up Instructions: I discussed the assessment and treatment plan with the patient. The patient was provided an opportunity to ask questions and all were answered. The patient agreed with the plan and demonstrated an understanding of the instructions.  A copy of instructions were sent to the patient via MyChart unless otherwise noted below.    The patient was advised to call back or seek an in-person evaluation if the symptoms worsen or if the condition fails to improve as  anticipated.  Time:  I spent 6 minutes with the patient via telehealth technology discussing the above problems/concerns.    Kimberly Loveless, PA-C

## 2021-11-15 ENCOUNTER — Ambulatory Visit: Payer: No Typology Code available for payment source

## 2021-11-15 ENCOUNTER — Inpatient Hospital Stay (HOSPITAL_COMMUNITY)
Admission: AD | Admit: 2021-11-15 | Discharge: 2021-11-15 | Disposition: A | Discharge status: Home / Self Care | Payer: No Typology Code available for payment source | Attending: Obstetrics and Gynecology | Admitting: Obstetrics and Gynecology

## 2021-11-15 ENCOUNTER — Other Ambulatory Visit: Payer: Self-pay

## 2021-11-15 ENCOUNTER — Encounter (HOSPITAL_COMMUNITY): Payer: Self-pay | Admitting: Obstetrics and Gynecology

## 2021-11-15 DIAGNOSIS — O10919 Unspecified pre-existing hypertension complicating pregnancy, unspecified trimester: Secondary | ICD-10-CM

## 2021-11-15 DIAGNOSIS — O10013 Pre-existing essential hypertension complicating pregnancy, third trimester: Secondary | ICD-10-CM | POA: Diagnosis not present

## 2021-11-15 DIAGNOSIS — O0990 Supervision of high risk pregnancy, unspecified, unspecified trimester: Secondary | ICD-10-CM

## 2021-11-15 DIAGNOSIS — N898 Other specified noninflammatory disorders of vagina: Secondary | ICD-10-CM | POA: Insufficient documentation

## 2021-11-15 DIAGNOSIS — Z79899 Other long term (current) drug therapy: Secondary | ICD-10-CM | POA: Diagnosis not present

## 2021-11-15 DIAGNOSIS — Z3A30 30 weeks gestation of pregnancy: Secondary | ICD-10-CM | POA: Insufficient documentation

## 2021-11-15 DIAGNOSIS — Z141 Cystic fibrosis carrier: Secondary | ICD-10-CM

## 2021-11-15 DIAGNOSIS — Z0371 Encounter for suspected problem with amniotic cavity and membrane ruled out: Secondary | ICD-10-CM

## 2021-11-15 DIAGNOSIS — O26893 Other specified pregnancy related conditions, third trimester: Secondary | ICD-10-CM | POA: Insufficient documentation

## 2021-11-15 LAB — COMPREHENSIVE METABOLIC PANEL
ALT: 11 U/L (ref 0–44)
AST: 12 U/L — ABNORMAL LOW (ref 15–41)
Albumin: 2.9 g/dL — ABNORMAL LOW (ref 3.5–5.0)
Alkaline Phosphatase: 96 U/L (ref 38–126)
Anion gap: 10 (ref 5–15)
BUN: <5 mg/dL — ABNORMAL LOW (ref 6–20)
CO2: 22 mmol/L (ref 22–32)
Calcium: 9.5 mg/dL (ref 8.9–10.3)
Chloride: 104 mmol/L (ref 98–111)
Creatinine, Ser: 0.6 mg/dL (ref 0.44–1.00)
GFR, Estimated: >60 mL/min (ref >60)
Glucose, Bld: 97 mg/dL (ref 70–99)
Potassium: 3.2 mmol/L — ABNORMAL LOW (ref 3.5–5.1)
Sodium: 136 mmol/L (ref 135–145)
Total Bilirubin: 0.3 mg/dL (ref 0.3–1.2)
Total Protein: 5.8 g/dL — ABNORMAL LOW (ref 6.5–8.1)

## 2021-11-15 LAB — URINALYSIS, ROUTINE W REFLEX MICROSCOPIC
Bilirubin Urine: NEGATIVE
Glucose, UA: 150 mg/dL — AB
Hgb urine dipstick: NEGATIVE
Ketones, ur: NEGATIVE mg/dL
Leukocytes,Ua: NEGATIVE
Nitrite: NEGATIVE
Protein, ur: 30 mg/dL — AB
Specific Gravity, Urine: 1.013 (ref 1.005–1.030)
pH: 6 (ref 5.0–8.0)

## 2021-11-15 LAB — PROTEIN / CREATININE RATIO, URINE
Creatinine, Urine: 106 mg/dL
Protein Creatinine Ratio: 0.44 mg/mg{Cre} — ABNORMAL HIGH (ref 0.00–0.15)
Total Protein, Urine: 47 mg/dL

## 2021-11-15 LAB — CBC
HCT: 29.5 % — ABNORMAL LOW (ref 36.0–46.0)
Hemoglobin: 9.9 g/dL — ABNORMAL LOW (ref 12.0–15.0)
MCH: 27.3 pg (ref 26.0–34.0)
MCHC: 33.6 g/dL (ref 30.0–36.0)
MCV: 81.5 fL (ref 80.0–100.0)
Platelets: 386 10*3/uL (ref 150–400)
RBC: 3.62 MIL/uL — ABNORMAL LOW (ref 3.87–5.11)
RDW: 13.8 % (ref 11.5–15.5)
WBC: 11.4 10*3/uL — ABNORMAL HIGH (ref 4.0–10.5)
nRBC: 0.5 % — ABNORMAL HIGH (ref 0.0–0.2)

## 2021-11-15 LAB — WET PREP, GENITAL
Sperm: NONE SEEN
Trich, Wet Prep: NONE SEEN
WBC, Wet Prep HPF POC: <10 (ref <10)
Yeast Wet Prep HPF POC: NONE SEEN

## 2021-11-15 MED ORDER — LABETALOL HCL 200 MG PO TABS
400.0000 mg | ORAL_TABLET | Freq: Three times a day (TID) | ORAL | 0 refills | Status: DC
Start: 2021-11-15 — End: 2022-01-02

## 2021-11-15 MED ORDER — LABETALOL HCL 100 MG PO TABS: 300.0000 mg | ORAL_TABLET | Freq: Once | ORAL | Status: DC

## 2021-11-15 MED ORDER — LABETALOL HCL 100 MG PO TABS
400.0000 mg | ORAL_TABLET | Freq: Once | ORAL | Status: AC
Start: 2021-11-15 — End: 2021-11-15
  Administered 2021-11-15: 400 mg via ORAL
  Filled 2021-11-15: qty 4

## 2021-11-15 NOTE — Discharge Instructions (Signed)
Please note the dose change in your Labetalol from 300 mg three times a day to 400 mg three times a day

## 2021-11-15 NOTE — MAU Note (Signed)
Kimberly Stark is a 31 y.o. at [redacted]w[redacted]d here in MAU reporting: she missed her OB appt today for BP check.  States she was instructed by office to go to MAU for BP check and labs.    Also reports she's been having a clear yellow mucus that began yesterday, also states noticing small amounts of fluid leaking.  Denies VB.  Endorses +FM. LMP: N/A Onset of complaint: yesterday Pain score: 0 Vitals:   11/15/21 1722  BP: (!) 152/77  Pulse: 99  Resp: 18  Temp: 98.4 F (36.9 C)  SpO2: 98%     FHT: 149 bpm Lab orders placed from triage:   UA

## 2021-11-15 NOTE — MAU Provider Note (Signed)
History     CSN: [id number]  Arrival date and time: 11/15/21 1653   Event Date/Time   First Provider Initiated Contact with Patient 11/15/21 1829      Chief Complaint  Patient presents with   BP Evaluation   Ms. Kimberly Stark is a 31 y.o. year old [id number] female at [redacted]w[redacted]d weeks gestation who presents to MAU reporting she missed her BP appointment today and was told to come in MAU for BP check. She also complains of a clear, yellow mucus vaginal discharge that "keeps leaking out." She denies VB. She reports good (+) FM today. She denies pain. She takes Labetalol 300 mg TID. She has taken 2/3 doses today; last was at 1500 today. She receives [facility] with [facility]; next appt is 12/02/2021. She states, "they were supposed to move that appointment up, but they haven't yet." Her spouse is present and contributing to the history taking.     OB History     Gravida  11   Para  2   Term  0   Preterm  2   AB  8   Living  2      SAB  7   IAB  1   Ectopic  0   Multiple  0   Live Births  2        Obstetric Comments  Pre-E with both preg, induced with both         Past Medical History:  Diagnosis Date   Asthma    Bipolar 1 disorder (HCC)    Pregnancy induced hypertension    Preterm labor    UTI (urinary tract infection)     Past Surgical History:  Procedure Laterality Date   CERVICAL BIOPSY  W/ LOOP ELECTRODE EXCISION     CERVICAL CERCLAGE     CESAREAN SECTION     DILATION AND CURETTAGE OF UTERUS      Family History  Problem Relation Age of Onset   Stroke Mother    Heart disease Mother        hx open heart issues   Hypertension Mother    Diabetes Mother    Cancer Father        started as colon, w/mets   Kidney disease Father        renal failure   Diabetes Father    Cancer Maternal Grandmother    Diabetes Paternal Grandmother     Social History   Tobacco Use   Smoking status: Former    Packs/day: 0.25    Types: Cigarettes   Smokeless tobacco: Never   Vaping Use   Vaping Use: Former   Substances: Nicotine  Substance Use Topics   Alcohol use: Not Currently   Drug use: Not Currently    Allergies:  Allergies  Allergen Reactions   Latex Anaphylaxis   Shellfish Allergy Anaphylaxis   Iodine Hives   Penicillins Hives    Medications Prior to Admission  Medication Sig Dispense Refill Last Dose   aspirin EC 81 MG tablet Take 1 tablet (81 mg total) by mouth daily. Swallow whole. 30 tablet 11 11/15/2021   Cholecalciferol (VITAMIN D3 PO) Take by mouth.   11/15/2021   enoxaparin (LOVENOX) 40 MG/0.4ML injection Inject 0.4 mLs (40 mg total) into the skin daily. 12 mL 8 11/14/2021   famotidine (PEPCID) 20 MG tablet Take 1 tablet (20 mg total) by mouth 2 (two) times daily. 60 tablet 2 11/15/2021   ferrous sulfate 325 (65 FE) MG EC tablet  Take 325 mg by mouth 3 (three) times daily with meals.   11/15/2021   labetalol (NORMODYNE) 300 MG tablet Take 1 tablet (300 mg total) by mouth 3 (three) times daily. 90 tablet 1 11/15/2021   nicotine polacrilex (COMMIT) 2 MG lozenge DISSOLVE 1 LOZENGE IN MOUTH EVERY FOUR HOURS AS NEEDED   11/15/2021   ondansetron (ZOFRAN) 4 MG tablet Take 1 tablet (4 mg total) by mouth every 8 (eight) hours as needed for nausea or vomiting. 20 tablet 0 11/15/2021   Prenatal MV & Min w/FA-DHA (PRENATAL GUMMIES PO) Take by mouth.   11/15/2021   sertraline (ZOLOFT) 100 MG tablet TAKE ONE-HALF TABLET BY MOUTH IN THE MORNING FOR 3 DAYS, THEN TAKE ONE TABLET IN THE MORNING FOR 30 DAYS FOR MENTAL HEALTH   11/15/2021    Review of Systems  Constitutional: Negative.   HENT: Negative.    Eyes: Negative.   Respiratory: Negative.    Cardiovascular: Negative.   Gastrointestinal: Negative.   Endocrine: Negative.   Genitourinary:  Positive for vaginal discharge (clear yellow mucus vaginal discharge).  Musculoskeletal: Negative.   Skin: Negative.   Allergic/Immunologic: Negative.   Neurological: Negative.   Hematological: Negative.    Psychiatric/Behavioral: Negative.     Physical Exam   Patient Vitals for the past 24 hrs:  BP Temp Temp src Pulse Resp SpO2 Height Weight  11/15/21 2046 (!) 147/79 -- -- 96 -- -- -- --  11/15/21 2001 (!) 141/64 -- -- 87 -- -- -- --  11/15/21 1946 (!) 144/65 -- -- 87 -- -- -- --  11/15/21 1930 (!) 146/68 -- -- 85 -- 98 % -- --  11/15/21 1915 (!) 140/72 -- -- 85 -- 100 % -- --  11/15/21 1900 (!) 149/80 -- -- 86 -- 100 % -- --  11/15/21 1830 (!) 140/79 -- -- 91 -- 99 % -- --  11/15/21 1816 139/68 -- -- 88 -- -- -- --  11/15/21 1801 (!) 140/65 -- -- 88 -- -- -- --  11/15/21 1748 138/73 -- -- 95 -- -- -- --  11/15/21 1722 (!) 152/77 98.4 F (36.9 C) Oral 99 18 98 % -- --  11/15/21 1715 -- -- -- -- -- -- 5\' 4"  (1.626 m) 100.8 kg    Physical Exam Vitals and nursing note reviewed. Exam conducted with a chaperone present.  Constitutional:      Appearance: Normal appearance. She is obese.  Cardiovascular:     Rate and Rhythm: Normal rate.  Pulmonary:     Effort: Pulmonary effort is normal.  Abdominal:     Palpations: Abdomen is soft.  Genitourinary:    General: Normal vulva.     Comments: Pelvic exam: External genitalia normal, SE: vaginal walls pink and well rugated, cervix is smooth, pink, no lesions, large amt of thick, white vaginal d/c -- WP, GC/CT done, cervix visually closed, Uterus is non-tender, S=D, no CMT or friability, no adnexal tenderness.  Musculoskeletal:        General: Normal range of motion.  Skin:    General: Skin is warm and dry.  Neurological:     Mental Status: She is alert and oriented to person, place, and time.  Psychiatric:        Mood and Affect: Mood normal.        Behavior: Behavior normal.        Thought Content: Thought content normal.        Judgment: Judgment normal.   REACTIVE NST - FHR: 135  bpm / moderate variability / accels present / decels absent / TOCO: none MAU Course  Procedures  MDM CCUA CBC CMP P/C Ratio Serial BP's    *Consult with Dr. Debroah Loop @ 2035 - notified of patient's complaints, assessments, lab & U/S results, recommended tx plan increase Labetalol dosing to 400 mg TID and BP check on Thursday or Friday of this week - ok to d/c home  Results for orders placed or performed during the hospital encounter of 11/15/21 (from the past 24 hour(s))  CBC     Status: Abnormal   Collection Time: 11/15/21  6:09 PM  Result Value Ref Range   WBC 11.4 (H) 4.0 - 10.5 K/uL   RBC 3.62 (L) 3.87 - 5.11 MIL/uL   Hemoglobin 9.9 (L) 12.0 - 15.0 g/dL   HCT 61.9 (L) 50.9 - 32.6 %   MCV 81.5 80.0 - 100.0 fL   MCH 27.3 26.0 - 34.0 pg   MCHC 33.6 30.0 - 36.0 g/dL   RDW 71.2 45.8 - 09.9 %   Platelets 386 150 - 400 K/uL   nRBC 0.5 (H) 0.0 - 0.2 %  Comprehensive metabolic panel     Status: Abnormal   Collection Time: 11/15/21  6:09 PM  Result Value Ref Range   Sodium 136 135 - 145 mmol/L   Potassium 3.2 (L) 3.5 - 5.1 mmol/L   Chloride 104 98 - 111 mmol/L   CO2 22 22 - 32 mmol/L   Glucose, Bld 97 70 - 99 mg/dL   BUN <5 (L) 6 - 20 mg/dL   Creatinine, Ser 8.33 0.44 - 1.00 mg/dL   Calcium 9.5 8.9 - 82.5 mg/dL   Total Protein 5.8 (L) 6.5 - 8.1 g/dL   Albumin 2.9 (L) 3.5 - 5.0 g/dL   AST 12 (L) 15 - 41 U/L   ALT 11 0 - 44 U/L   Alkaline Phosphatase 96 38 - 126 U/L   Total Bilirubin 0.3 0.3 - 1.2 mg/dL   GFR, Estimated >05 >39 mL/min   Anion gap 10 5 - 15  Urinalysis, Routine w reflex microscopic Urine, Clean Catch     Status: Abnormal   Collection Time: 11/15/21  6:11 PM  Result Value Ref Range   Color, Urine YELLOW YELLOW   APPearance HAZY (A) CLEAR   Specific Gravity, Urine 1.013 1.005 - 1.030   pH 6.0 5.0 - 8.0   Glucose, UA 150 (A) NEGATIVE mg/dL   Hgb urine dipstick NEGATIVE NEGATIVE   Bilirubin Urine NEGATIVE NEGATIVE   Ketones, ur NEGATIVE NEGATIVE mg/dL   Protein, ur 30 (A) NEGATIVE mg/dL   Nitrite NEGATIVE NEGATIVE   Leukocytes,Ua NEGATIVE NEGATIVE   RBC / HPF 0-5 0 - 5 RBC/hpf   WBC, UA 0-5 0 - 5  WBC/hpf   Bacteria, UA RARE (A) NONE SEEN   Squamous Epithelial / LPF 11-20 0 - 5   Mucus PRESENT    Ca Oxalate Crys, UA PRESENT   Protein / creatinine ratio, urine     Status: Abnormal   Collection Time: 11/15/21  6:11 PM  Result Value Ref Range   Creatinine, Urine 106 mg/dL   Total Protein, Urine 47 mg/dL   Protein Creatinine Ratio 0.44 (H) 0.00 - 0.15 mg/mg[Cre]  Wet prep, genital     Status: Abnormal   Collection Time: 11/15/21  6:47 PM   Specimen: Vaginal  Result Value Ref Range   Yeast Wet Prep HPF POC NONE SEEN NONE SEEN   Trich, Wet Prep NONE  SEEN NONE SEEN   Clue Cells Wet Prep HPF POC PRESENT (A) NONE SEEN   WBC, Wet Prep HPF POC <10 <10   Sperm NONE SEEN       Assessment and Plan  Chronic hypertension affecting pregnancy  - Rx for Labetalol 400 mg TID - Advised of change in Labetalol dose - Advised to have BP check in the office on Thursday or Friday of this week  No leakage of amniotic fluid into vagina  - Reassurance given that her vaginal discharge is normal and no infection was detected on tonight's exam  Leukorrhea  - Explained to patient of normal variations of vaginal discharge in pregnancy and in different gestations  [redacted] weeks gestation of pregnancy   - Discharge patient - Message sent to [facility] to have patient seen in the office on Thursday or Friday of this week for a BP check. - Patient verbalized an understanding of the plan of care and agrees.  Raelyn Mora, CNM 11/15/2021, 6:29 PM

## 2021-11-16 LAB — GC/CHLAMYDIA PROBE AMP (~~LOC~~) NOT AT ARMC
Chlamydia: NEGATIVE
Comment: NEGATIVE
Comment: NORMAL
Neisseria Gonorrhea: NEGATIVE

## 2021-11-17 ENCOUNTER — Other Ambulatory Visit: Payer: Self-pay

## 2021-11-17 ENCOUNTER — Ambulatory Visit (INDEPENDENT_AMBULATORY_CARE_PROVIDER_SITE_OTHER): Payer: No Typology Code available for payment source

## 2021-11-17 VITALS — BP 128/65 | HR 87

## 2021-11-17 DIAGNOSIS — O0990 Supervision of high risk pregnancy, unspecified, unspecified trimester: Secondary | ICD-10-CM

## 2021-11-17 DIAGNOSIS — O10919 Unspecified pre-existing hypertension complicating pregnancy, unspecified trimester: Secondary | ICD-10-CM

## 2021-11-17 NOTE — Progress Notes (Signed)
Pt here today for BP check. Pt is 31+[redacted] weeks pregnant today.  Pt was seen at MAU on 9/5 and had BP meds changed to 400mg  TID of Rx Procardia. Pt has CHTN.  Pt states having no headaches today, but had one yesterday. Denies visual changes and no swelling noted today at visit.   BP today: 128/65  FHT 138  Pt advised to continue to take BP meds and to keep next  OB visit 9/11 with Dr 11/11. Pt verbalized understanding and agreeable to plan of care.   Kimberly Stark,RNC

## 2021-11-21 ENCOUNTER — Other Ambulatory Visit: Payer: Self-pay

## 2021-11-21 ENCOUNTER — Encounter: Payer: No Typology Code available for payment source | Admitting: Advanced Practice Midwife

## 2021-11-21 ENCOUNTER — Encounter: Payer: No Typology Code available for payment source | Admitting: Obstetrics and Gynecology

## 2021-11-21 ENCOUNTER — Encounter: Payer: Self-pay | Admitting: Obstetrics and Gynecology

## 2021-11-21 ENCOUNTER — Ambulatory Visit (INDEPENDENT_AMBULATORY_CARE_PROVIDER_SITE_OTHER): Payer: No Typology Code available for payment source | Admitting: Obstetrics and Gynecology

## 2021-11-21 VITALS — BP 138/71 | HR 98 | Wt 221.2 lb

## 2021-11-21 DIAGNOSIS — Z6838 Body mass index (BMI) 38.0-38.9, adult: Secondary | ICD-10-CM | POA: Insufficient documentation

## 2021-11-21 DIAGNOSIS — O09293 Supervision of pregnancy with other poor reproductive or obstetric history, third trimester: Secondary | ICD-10-CM

## 2021-11-21 DIAGNOSIS — O09893 Supervision of other high risk pregnancies, third trimester: Secondary | ICD-10-CM

## 2021-11-21 DIAGNOSIS — H409 Unspecified glaucoma: Secondary | ICD-10-CM

## 2021-11-21 DIAGNOSIS — Z141 Cystic fibrosis carrier: Secondary | ICD-10-CM

## 2021-11-21 DIAGNOSIS — Z98891 History of uterine scar from previous surgery: Secondary | ICD-10-CM

## 2021-11-21 DIAGNOSIS — Z86711 Personal history of pulmonary embolism: Secondary | ICD-10-CM

## 2021-11-21 DIAGNOSIS — O0990 Supervision of high risk pregnancy, unspecified, unspecified trimester: Secondary | ICD-10-CM

## 2021-11-21 DIAGNOSIS — I10 Essential (primary) hypertension: Secondary | ICD-10-CM

## 2021-11-21 DIAGNOSIS — Z7901 Long term (current) use of anticoagulants: Secondary | ICD-10-CM

## 2021-11-21 DIAGNOSIS — Z9889 Other specified postprocedural states: Secondary | ICD-10-CM | POA: Insufficient documentation

## 2021-11-21 DIAGNOSIS — O99213 Obesity complicating pregnancy, third trimester: Secondary | ICD-10-CM

## 2021-11-21 DIAGNOSIS — G935 Compression of brain: Secondary | ICD-10-CM

## 2021-11-21 DIAGNOSIS — O3443 Maternal care for other abnormalities of cervix, third trimester: Secondary | ICD-10-CM

## 2021-11-21 NOTE — Progress Notes (Signed)
PRENATAL VISIT NOTE  Subjective:  Kimberly Stark is a 31 y.o. [id number] at [redacted]w[redacted]d being seen today for ongoing prenatal care.  She is currently monitored for the following issues for this high-risk pregnancy and has History of HELLP syndrome, currently pregnant, third trimester; Supervision of high risk pregnancy, antepartum; History of cervical cerclage; History of pulmonary embolus (PE); Tobacco abuse; History of C-section; Cystic fibrosis carrier; Chronic hypertension; Migraine with typical aura; Mild dysplasia of cervix (CIN I); Insomnia; Glaucoma; Chronic post-traumatic stress disorder; History of LEEP (loop electrosurgical excision procedure) of cervix complicating pregnancy in third trimester; BMI 38.0-38.9,adult; Obesity complicating pregnancy in third trimester; History of preterm delivery, currently pregnant in third trimester; Chiari malformation type I (HCC); and Anticoagulated on their problem list.  Patient reports no complaints.  Contractions: Irritability. Vag. Bleeding: None.  Movement: Present. Denies leaking of fluid.   The following portions of the patient's history were reviewed and updated as appropriate: allergies, current medications, past family history, past medical history, past social history, past surgical history and problem list.   Objective:   Vitals:   11/21/21 1059  BP: 138/71  Pulse: 98  Weight: 221 lb 3.2 oz (100.3 kg)    Fetal Status: Fetal Heart Rate (bpm): 155   Movement: Present     General:  Alert, oriented and cooperative. Patient is in no acute distress.  Skin: Skin is warm and dry. No rash noted.   Cardiovascular: Normal heart rate noted  Respiratory: Normal respiratory effort, no problems with respiration noted  Abdomen: Soft, gravid, appropriate for gestational age.  Pain/Pressure: Present     Pelvic: Cervical exam deferred        Extremities: Normal range of motion.  Edema: Trace  Mental Status: Normal mood and affect. Normal behavior. Normal  judgment and thought content.   Assessment and Plan:  Pregnancy: [id number] at [redacted]w[redacted]d 1. Chiari malformation type I [facility]) Care everywhere able to find chart from last pregnancy and this was noted and that she was cleared by neurology for a vag delivery and she also got an epidural. Pt states that this is true and they did an MRI  D/w her need for another neurology consult and this was placed as urgent - Ambulatory referral to Neurology  2. Chronic hypertension BPs better after mau visit where her labetalol was uptitrated to 400 tid.  Given escalation in dosing and her h/o late pre-term and 37wk severe pre-eclampsia in the past, 37wk delivery recommended (see below), which she is amenable to Repeat growth and qwk testing to start later this week.  8/17: efw 12%, ac 11%, afi 19  3. History of LEEP (loop electrosurgical excision procedure) of cervix complicating pregnancy in third trimester Normal CLs this pregnancy  4. Supervision of high risk pregnancy, antepartum 28wk labs normal Patient unsure about contraception  5. Obesity complicating pregnancy in third trimester Continue to follow. +4lbs vs last visit. 52lbs TWG  6. BMI 38.0-38.9,adult  7. Anticoagulated Confirms on lovenox 40 qday.  8. History of pulmonary embolus (PE) In the setting of 2021 covid. Continue ppx lovenox and for six weeks PP  9. History of preterm delivery, currently pregnant in third trimester Iatrogenic due to severe pre-eclampsia vs HELLP  10. History of C-section 2021 PLTCS for 37wk 8lbs 3oz arrest of descent. Already d/w pt and she desires rpt c-section. Request sent for 37wk repeat  11. History of HELLP syndrome, currently pregnant, third trimester Continue low dose asa  12. History of cervical cerclage Not in  this pregnancy  13. Cystic fibrosis carrier  14. Glaucoma, unspecified glaucoma type, unspecified laterality Followed at the [facility]  Preterm labor symptoms and general obstetric precautions  including but not limited to vaginal bleeding, contractions, leaking of fluid and fetal movement were reviewed in detail with the patient. Please refer to After Visit Summary for other counseling recommendations.   Return in about 10 days (around 12/01/2021) for high risk ob, md visit, in person.  Future Appointments  Date Time Provider Department Center  11/24/2021 11:15 AM [facility] NURSE [facility]  11/24/2021 11:30 AM [facility] US3 [facility]  12/01/2021  1:30 PM [facility] NURSE [facility]  12/01/2021  1:45 PM [facility] US4 [facility]  12/01/2021  3:55 PM Centralia Bing, MD [facility]  12/08/2021  1:30 PM [facility] NURSE [facility]  12/08/2021  1:45 PM [facility] US4 [facility]  12/15/2021  1:30 PM [facility] NURSE [facility]  12/15/2021  1:45 PM [facility] US4 [facility]    Dodge Bing, MD

## 2021-11-22 ENCOUNTER — Other Ambulatory Visit: Payer: Self-pay | Admitting: Obstetrics and Gynecology

## 2021-11-22 DIAGNOSIS — O0990 Supervision of high risk pregnancy, unspecified, unspecified trimester: Secondary | ICD-10-CM

## 2021-11-24 ENCOUNTER — Ambulatory Visit: Payer: No Typology Code available for payment source | Attending: Maternal & Fetal Medicine

## 2021-11-24 ENCOUNTER — Other Ambulatory Visit: Payer: Self-pay | Admitting: *Deleted

## 2021-11-24 ENCOUNTER — Ambulatory Visit: Payer: No Typology Code available for payment source | Admitting: *Deleted

## 2021-11-24 VITALS — BP 138/63 | HR 94

## 2021-11-24 DIAGNOSIS — F17200 Nicotine dependence, unspecified, uncomplicated: Secondary | ICD-10-CM

## 2021-11-24 DIAGNOSIS — O2693 Pregnancy related conditions, unspecified, third trimester: Secondary | ICD-10-CM

## 2021-11-24 DIAGNOSIS — O34219 Maternal care for unspecified type scar from previous cesarean delivery: Secondary | ICD-10-CM | POA: Diagnosis not present

## 2021-11-24 DIAGNOSIS — O09892 Supervision of other high risk pregnancies, second trimester: Secondary | ICD-10-CM | POA: Diagnosis present

## 2021-11-24 DIAGNOSIS — O09293 Supervision of pregnancy with other poor reproductive or obstetric history, third trimester: Secondary | ICD-10-CM

## 2021-11-24 DIAGNOSIS — O10913 Unspecified pre-existing hypertension complicating pregnancy, third trimester: Secondary | ICD-10-CM

## 2021-11-24 DIAGNOSIS — O3430 Maternal care for cervical incompetence, unspecified trimester: Secondary | ICD-10-CM | POA: Diagnosis present

## 2021-11-24 DIAGNOSIS — Z86711 Personal history of pulmonary embolism: Secondary | ICD-10-CM

## 2021-11-24 DIAGNOSIS — Z7901 Long term (current) use of anticoagulants: Secondary | ICD-10-CM

## 2021-11-24 DIAGNOSIS — O0990 Supervision of high risk pregnancy, unspecified, unspecified trimester: Secondary | ICD-10-CM | POA: Insufficient documentation

## 2021-11-24 DIAGNOSIS — O10912 Unspecified pre-existing hypertension complicating pregnancy, second trimester: Secondary | ICD-10-CM | POA: Insufficient documentation

## 2021-11-24 DIAGNOSIS — O3433 Maternal care for cervical incompetence, third trimester: Secondary | ICD-10-CM | POA: Diagnosis not present

## 2021-11-24 DIAGNOSIS — O99213 Obesity complicating pregnancy, third trimester: Secondary | ICD-10-CM

## 2021-11-24 DIAGNOSIS — O99333 Smoking (tobacco) complicating pregnancy, third trimester: Secondary | ICD-10-CM

## 2021-11-24 DIAGNOSIS — O10013 Pre-existing essential hypertension complicating pregnancy, third trimester: Secondary | ICD-10-CM | POA: Diagnosis not present

## 2021-11-24 DIAGNOSIS — Z141 Cystic fibrosis carrier: Secondary | ICD-10-CM | POA: Insufficient documentation

## 2021-11-24 DIAGNOSIS — O09893 Supervision of other high risk pregnancies, third trimester: Secondary | ICD-10-CM

## 2021-11-24 DIAGNOSIS — O09299 Supervision of pregnancy with other poor reproductive or obstetric history, unspecified trimester: Secondary | ICD-10-CM

## 2021-11-24 DIAGNOSIS — Z3A32 32 weeks gestation of pregnancy: Secondary | ICD-10-CM

## 2021-11-24 DIAGNOSIS — O3440 Maternal care for other abnormalities of cervix, unspecified trimester: Secondary | ICD-10-CM

## 2021-12-01 ENCOUNTER — Other Ambulatory Visit: Payer: Self-pay

## 2021-12-01 ENCOUNTER — Ambulatory Visit (INDEPENDENT_AMBULATORY_CARE_PROVIDER_SITE_OTHER): Payer: No Typology Code available for payment source | Admitting: Obstetrics and Gynecology

## 2021-12-01 ENCOUNTER — Ambulatory Visit: Payer: No Typology Code available for payment source | Admitting: *Deleted

## 2021-12-01 ENCOUNTER — Ambulatory Visit: Payer: No Typology Code available for payment source | Attending: Obstetrics and Gynecology

## 2021-12-01 ENCOUNTER — Encounter (HOSPITAL_COMMUNITY): Payer: Self-pay | Admitting: Obstetrics and Gynecology

## 2021-12-01 ENCOUNTER — Inpatient Hospital Stay (HOSPITAL_COMMUNITY)
Admission: AD | Admit: 2021-12-01 | Discharge: 2021-12-01 | Disposition: A | Discharge status: Home / Self Care | Payer: No Typology Code available for payment source | Attending: Obstetrics and Gynecology | Admitting: Obstetrics and Gynecology

## 2021-12-01 VITALS — BP 139/73 | HR 87

## 2021-12-01 VITALS — BP 162/79 | HR 102 | Wt 225.1 lb

## 2021-12-01 DIAGNOSIS — Z86711 Personal history of pulmonary embolism: Secondary | ICD-10-CM

## 2021-12-01 DIAGNOSIS — O212 Late vomiting of pregnancy: Secondary | ICD-10-CM | POA: Insufficient documentation

## 2021-12-01 DIAGNOSIS — O34219 Maternal care for unspecified type scar from previous cesarean delivery: Secondary | ICD-10-CM

## 2021-12-01 DIAGNOSIS — O3433 Maternal care for cervical incompetence, third trimester: Secondary | ICD-10-CM

## 2021-12-01 DIAGNOSIS — Z9889 Other specified postprocedural states: Secondary | ICD-10-CM

## 2021-12-01 DIAGNOSIS — G935 Compression of brain: Secondary | ICD-10-CM

## 2021-12-01 DIAGNOSIS — O0990 Supervision of high risk pregnancy, unspecified, unspecified trimester: Secondary | ICD-10-CM

## 2021-12-01 DIAGNOSIS — O99891 Other specified diseases and conditions complicating pregnancy: Secondary | ICD-10-CM | POA: Insufficient documentation

## 2021-12-01 DIAGNOSIS — O99213 Obesity complicating pregnancy, third trimester: Secondary | ICD-10-CM

## 2021-12-01 DIAGNOSIS — R030 Elevated blood-pressure reading, without diagnosis of hypertension: Secondary | ICD-10-CM | POA: Diagnosis not present

## 2021-12-01 DIAGNOSIS — O09893 Supervision of other high risk pregnancies, third trimester: Secondary | ICD-10-CM

## 2021-12-01 DIAGNOSIS — I10 Essential (primary) hypertension: Secondary | ICD-10-CM

## 2021-12-01 DIAGNOSIS — O09293 Supervision of pregnancy with other poor reproductive or obstetric history, third trimester: Secondary | ICD-10-CM

## 2021-12-01 DIAGNOSIS — Z141 Cystic fibrosis carrier: Secondary | ICD-10-CM | POA: Insufficient documentation

## 2021-12-01 DIAGNOSIS — R519 Headache, unspecified: Secondary | ICD-10-CM

## 2021-12-01 DIAGNOSIS — G43109 Migraine with aura, not intractable, without status migrainosus: Secondary | ICD-10-CM

## 2021-12-01 DIAGNOSIS — Z6838 Body mass index (BMI) 38.0-38.9, adult: Secondary | ICD-10-CM

## 2021-12-01 DIAGNOSIS — O10913 Unspecified pre-existing hypertension complicating pregnancy, third trimester: Secondary | ICD-10-CM | POA: Diagnosis present

## 2021-12-01 DIAGNOSIS — O0993 Supervision of high risk pregnancy, unspecified, third trimester: Secondary | ICD-10-CM

## 2021-12-01 DIAGNOSIS — Z3A33 33 weeks gestation of pregnancy: Secondary | ICD-10-CM | POA: Diagnosis not present

## 2021-12-01 DIAGNOSIS — O26893 Other specified pregnancy related conditions, third trimester: Secondary | ICD-10-CM | POA: Diagnosis present

## 2021-12-01 DIAGNOSIS — O3443 Maternal care for other abnormalities of cervix, third trimester: Secondary | ICD-10-CM

## 2021-12-01 DIAGNOSIS — O163 Unspecified maternal hypertension, third trimester: Secondary | ICD-10-CM

## 2021-12-01 DIAGNOSIS — O09299 Supervision of pregnancy with other poor reproductive or obstetric history, unspecified trimester: Secondary | ICD-10-CM | POA: Diagnosis not present

## 2021-12-01 DIAGNOSIS — Z98891 History of uterine scar from previous surgery: Secondary | ICD-10-CM

## 2021-12-01 HISTORY — DX: Compression of brain: G93.5

## 2021-12-01 LAB — URINALYSIS, ROUTINE W REFLEX MICROSCOPIC
Bilirubin Urine: NEGATIVE
Glucose, UA: 50 mg/dL — AB
Hgb urine dipstick: NEGATIVE
Ketones, ur: NEGATIVE mg/dL
Leukocytes,Ua: NEGATIVE
Nitrite: NEGATIVE
Protein, ur: 30 mg/dL — AB
Specific Gravity, Urine: 1.013 (ref 1.005–1.030)
pH: 6 (ref 5.0–8.0)

## 2021-12-01 LAB — COMPREHENSIVE METABOLIC PANEL
ALT: 11 U/L (ref 0–44)
AST: 16 U/L (ref 15–41)
Albumin: 2.8 g/dL — ABNORMAL LOW (ref 3.5–5.0)
Alkaline Phosphatase: 119 U/L (ref 38–126)
Anion gap: 9 (ref 5–15)
BUN: <5 mg/dL — ABNORMAL LOW (ref 6–20)
CO2: 19 mmol/L — ABNORMAL LOW (ref 22–32)
Calcium: 9.5 mg/dL (ref 8.9–10.3)
Chloride: 108 mmol/L (ref 98–111)
Creatinine, Ser: 0.62 mg/dL (ref 0.44–1.00)
GFR, Estimated: >60 mL/min (ref >60)
Glucose, Bld: 169 mg/dL — ABNORMAL HIGH (ref 70–99)
Potassium: 3 mmol/L — ABNORMAL LOW (ref 3.5–5.1)
Sodium: 136 mmol/L (ref 135–145)
Total Bilirubin: 0.3 mg/dL (ref 0.3–1.2)
Total Protein: 5.7 g/dL — ABNORMAL LOW (ref 6.5–8.1)

## 2021-12-01 LAB — PROTEIN / CREATININE RATIO, URINE
Creatinine, Urine: 107 mg/dL
Protein Creatinine Ratio: 0.32 mg/mg{Cre} — ABNORMAL HIGH (ref 0.00–0.15)
Total Protein, Urine: 34 mg/dL

## 2021-12-01 LAB — CBC
HCT: 30.9 % — ABNORMAL LOW (ref 36.0–46.0)
Hemoglobin: 9.8 g/dL — ABNORMAL LOW (ref 12.0–15.0)
MCH: 26.5 pg (ref 26.0–34.0)
MCHC: 31.7 g/dL (ref 30.0–36.0)
MCV: 83.5 fL (ref 80.0–100.0)
Platelets: 415 10*3/uL — ABNORMAL HIGH (ref 150–400)
RBC: 3.7 MIL/uL — ABNORMAL LOW (ref 3.87–5.11)
RDW: 14.3 % (ref 11.5–15.5)
WBC: 11.6 10*3/uL — ABNORMAL HIGH (ref 4.0–10.5)
nRBC: 0.8 % — ABNORMAL HIGH (ref 0.0–0.2)

## 2021-12-01 MED ORDER — METOCLOPRAMIDE HCL 10 MG PO TABS
10.0000 mg | ORAL_TABLET | Freq: Once | ORAL | Status: AC
  Administered 2021-12-01: 10 mg via ORAL
  Filled 2021-12-01: qty 1

## 2021-12-01 MED ORDER — CYCLOBENZAPRINE HCL 5 MG PO TABS
10.0000 mg | ORAL_TABLET | Freq: Once | ORAL | Status: AC
  Administered 2021-12-01: 10 mg via ORAL
  Filled 2021-12-01: qty 2

## 2021-12-01 NOTE — Progress Notes (Signed)
PRENATAL VISIT NOTE  Subjective:  Kimberly Stark is a 31 y.o. [id number] at [redacted]w[redacted]d being seen today for ongoing prenatal care.  She is currently monitored for the following issues for this high-risk pregnancy and has History of HELLP syndrome, currently pregnant, third trimester; Supervision of high risk pregnancy, antepartum; History of cervical cerclage; History of pulmonary embolus (PE); Tobacco abuse; History of C-section; Cystic fibrosis carrier; Chronic hypertension; Migraine with typical aura; Mild dysplasia of cervix (CIN I); Insomnia; Glaucoma; Chronic post-traumatic stress disorder; History of LEEP (loop electrosurgical excision procedure) of cervix complicating pregnancy in third trimester; BMI 38.0-38.9,adult; Obesity complicating pregnancy in third trimester; History of preterm delivery, currently pregnant in third trimester; Chiari malformation type I (HCC); and Anticoagulated on their problem list.  Patient reports  headache since yesterday .  Contractions: Not present. Vag. Bleeding: None.  Movement: Present. Denies leaking of fluid.   The following portions of the patient's history were reviewed and updated as appropriate: allergies, current medications, past family history, past medical history, past social history, past surgical history and problem list.   Objective:   Vitals:   12/01/21 1633 12/01/21 1647  BP: (!) 152/86 (!) 162/79  Pulse: (!) 101 (!) 102  Weight: 225 lb 1.6 oz (102.1 kg)     Fetal Status: Fetal Heart Rate (bpm): 137   Movement: Present     General:  Alert, oriented and cooperative. Patient is in no acute distress.  Skin: Skin is warm and dry. No rash noted.   Cardiovascular: Normal heart rate noted  Respiratory: Normal respiratory effort, no problems with respiration noted  Abdomen: Soft, gravid, appropriate for gestational age.  Pain/Pressure: Present     Pelvic: Cervical exam deferred        Extremities: Normal range of motion.  Edema: Trace  Mental  Status: Normal mood and affect. Normal behavior. Normal judgment and thought content.   Assessment and Plan:  Pregnancy: [id number] at [redacted]w[redacted]d 1. Chronic hypertension On labetalol 400 tid.  Recommend mau evaluation since BPs are elevated over her baseline and new, one day headache, which I told her could be from her h/o migraines and chiari malformation but need to make sure it's not from pre-eclampsia given her history of hellp. Mau called - Protein / creatinine ratio, urine - CBC - Comprehensive metabolic panel  2. Pregnancy headache in third trimester  3. History of C-section Scheduled for repeat  4. Chiari malformation type I [facility]) Has neuro new patient appt with [facility] early october  5. BMI 38.0-38.9,adult  6. Obesity complicating pregnancy in third trimester  7. History of LEEP (loop electrosurgical excision procedure) of cervix complicating pregnancy in third trimester  8. History of preterm delivery, currently pregnant in third trimester  9. Migraine with typical aura  10. History of HELLP syndrome, currently pregnant, third trimester  11. Supervision of high risk pregnancy, antepartum  12. History of pulmonary embolus (PE) Continue qday ppx lovenox  Preterm labor symptoms and general obstetric precautions including but not limited to vaginal bleeding, contractions, leaking of fluid and fetal movement were reviewed in detail with the patient. Please refer to After Visit Summary for other counseling recommendations.   Return in 4 days (on 12/05/2021) for bp check, in person, md visit, high risk ob.  Future Appointments  Date Time Provider Department Center  12/08/2021  1:30 PM [facility] NURSE [facility]  12/08/2021  1:45 PM [facility] US6 [facility]  12/13/2021  2:00 PM Levert Feinstein, MD [facility] None  12/15/2021  1:30  PM [facility] NURSE [facility]  12/15/2021  1:45 PM [facility] US4 [facility]  12/22/2021  8:45 AM [facility] NURSE [facility]  12/22/2021  9:00 AM [facility] US1  [facility]    Aletha Halim, MD

## 2021-12-01 NOTE — MAU Note (Signed)
Kimberly Stark is a 31 y.o. at [redacted]w[redacted]d here in MAU reporting: sent from office, BP elevated, HA since yesterday- no relief with Tylenol, states seems to be affecting her vision. reports increased swelling in hands and feet. Last night was having pain in Rt side (upper and lower), was so bad that she couldn't sleep.   Onset of complaint: last few days Pain score: 5 Vitals:   12/01/21 1732  BP: (!) 150/65  Pulse: 94  Resp: 20  Temp: 98.4 F (36.9 C)  SpO2: 100%     FHT:140 Lab orders placed from triage:  uri

## 2021-12-01 NOTE — MAU Provider Note (Addendum)
History  CSN: [id number]   Arrival date and time: 12/01/21 1713    Event Date/Time   First Provider Initiated Contact with Patient 12/01/21 1914          Chief Complaint  Patient presents with   Headache   Hypertension   Foot Swelling   Abdominal Pain    Kimberly Stark is a 31 y.o. [id number] at [redacted]w[redacted]d with a history of pre-eclampsia, chronic hypertension, and pulmonary embolism presenting at [facility] after a prenatal care visit at [facility] due to HA, swelling of hands and feet, RUQ pain, and high blood pressure. Patient reports she gets high blood pressure every time she's pregnant during the 13 week mark. Her current headache began at 16 weeks and RUQ pain started at 28 weeks. She said the last week she has been having significant swelling of her feet and provided pictures on her phone. On Tuesday (9/19) she had a headache and RUQ pain that was rated 10/10 with 10 being the worst. She tried a hot bath, tylenol, and rested. This did not relieve the pain but she was able to sleep and the pain decreased. She is currently having a 5/10 pain in her RUQ that is described as stabbing pain and a 5/10 HA that is unilateral but sometimes generalized. She reports 2 episodes of emesis last night from the pain.    She endorses good fetal movement, denies chest pain, shortness of breath, LOF, vaginal bleeding, vaginal itching/burning, urinary symptoms, diarrhea, or fever/chills.     Headache  Associated symptoms include abdominal pain. Her past medical history is significant for hypertension.  Hypertension Associated symptoms include headaches. Pertinent negatives include no chest pain or shortness of breath.  Abdominal Pain Associated symptoms include headaches.      OB History       Gravida  11   Para  2   Term  1   Preterm  1   AB  8   Living  2        SAB  7   IAB  1   Ectopic  0   Multiple  0   Live Births  2          Obstetric Comments  Pre-E with both preg, induced with both                  Past Medical History:  Diagnosis Date   Asthma     Bipolar 1 disorder (HCC)     Chiari malformation type I (HCC)     History of pulmonary embolism 2021    in setting of covid   Pregnancy induced hypertension     Preterm labor     UTI (urinary tract infection)             Past Surgical History:  Procedure Laterality Date   CERVICAL BIOPSY  W/ LOOP ELECTRODE EXCISION       CERVICAL CERCLAGE       CESAREAN SECTION       DILATION AND CURETTAGE OF UTERUS               Family History  Problem Relation Age of Onset   Stroke Mother     Heart disease Mother          hx open heart issues   Hypertension Mother     Diabetes Mother     Cancer Father          started as colon, w/mets   Kidney  disease Father          renal failure   Diabetes Father     Cancer Maternal Grandmother     Diabetes Paternal Grandmother        Social History         Tobacco Use   Smoking status: Former      Packs/day: 0.25      Types: Cigarettes   Smokeless tobacco: Never  Vaping Use   Vaping Use: Former   Substances: Nicotine  Substance Use Topics   Alcohol use: Not Currently   Drug use: Not Currently      Allergies:      Allergies  Allergen Reactions   Iodinated Contrast Media Shortness Of Breath, Rash and Swelling   Latex Anaphylaxis   Shellfish Allergy Anaphylaxis   Iodine Hives   Penicillins Hives             Medications Prior to Admission  Medication Sig Dispense Refill Last Dose   aspirin EC 81 MG tablet Take 1 tablet (81 mg total) by mouth daily. Swallow whole. 30 tablet 11 12/01/2021   Cholecalciferol (VITAMIN D3 PO) Take by mouth.     12/01/2021   enoxaparin (LOVENOX) 40 MG/0.4ML injection Inject 0.4 mLs (40 mg total) into the skin daily. 12 mL 8 11/30/2021   famotidine (PEPCID) 20 MG tablet Take 1 tablet (20 mg total) by mouth 2 (two) times daily. 60 tablet 2 11/30/2021   ferrous sulfate 325 (65 FE) MG EC tablet Take 325 mg by mouth 3 (three) times daily  with meals.     12/01/2021   labetalol (NORMODYNE) 200 MG tablet Take 2 tablets (400 mg total) by mouth 3 (three) times daily. 90 tablet 0 12/01/2021   nicotine polacrilex (COMMIT) 2 MG lozenge DISSOLVE 1 LOZENGE IN MOUTH EVERY FOUR HOURS AS NEEDED     12/01/2021   ondansetron (ZOFRAN) 4 MG tablet Take 1 tablet (4 mg total) by mouth every 8 (eight) hours as needed for nausea or vomiting. 20 tablet 0 11/30/2021   Prenatal MV & Min w/FA-DHA (PRENATAL GUMMIES PO) Take by mouth.     12/01/2021   sertraline (ZOLOFT) 100 MG tablet TAKE ONE-HALF TABLET BY MOUTH IN THE MORNING FOR 3 DAYS, THEN TAKE ONE TABLET IN THE MORNING FOR 30 DAYS FOR MENTAL HEALTH     12/01/2021      Review of Systems  Respiratory:  Negative for shortness of breath.   Cardiovascular:  Positive for leg swelling. Negative for chest pain.  Gastrointestinal:  Positive for abdominal pain.       RUQ pain for the last 2 days  Neurological:  Positive for headaches.    Physical Exam    Blood pressure (!) 159/67, pulse 89, temperature 98.4 F (36.9 C), temperature source Oral, resp. rate 20, height 5\' 4"  (1.626 m), weight 103 kg, last menstrual period 03/22/2021, SpO2 100 %.   Physical Exam HENT:     Head: Normocephalic and atraumatic.     Mouth/Throat:     Mouth: Mucous membranes are moist.     Pharynx: Oropharynx is clear.  Cardiovascular:     Rate and Rhythm: Regular rhythm. Tachycardia present.  Pulmonary:     Effort: Pulmonary effort is normal.     Breath sounds: Normal breath sounds.  Abdominal:     General: Bowel sounds are normal.     Tenderness: There is abdominal tenderness.     Comments: RUQ tenderness  Musculoskeletal:     Right  hand: Swelling present.     Left hand: Swelling present.     Right lower leg: 1+ Pitting Edema present.     Left lower leg: 1+ Pitting Edema present.  Neurological:     Mental Status: She is alert.      Fetal monitoringBaseline: 140 bpm, Variability:Moderate Accelerations: 15x15  Decelerations: Absent Toco: irritability    I confirm that I have verified and agree with the information documented in the resident's note. Any additional physical assessment characteristics and MDM re-performed listed below.    HPI: Patient sent over from the office for PreE work up in the presence of a headache for 2 days and on-going RUQ pain. She also had some swelling 2 days ago, but states swelling has improved since arrival to MAU.  Patient currently rating headache 5/10. She denies visual changes. Patient states BPs at home are high 150/100s then after labetalol down to 140/90's. Patient denies vaginal bleeding, leaking of fluid, or contractions. She also endorses positive fetal movement.   Mild, bilateral non-pitting edema noted on exam. DTRs +1.   Patient Vitals for the past 24 hrs:  BP Temp Temp src Pulse Resp SpO2 Height Weight  12/01/21 2046 (!) 157/72 -- -- 95 -- -- -- --  12/01/21 2042 (!) 144/69 -- -- 91 -- -- -- --  12/01/21 2001 (!) 166/68 -- -- 95 -- -- -- --  12/01/21 1946 (!) 159/67 -- -- 89 -- -- -- --  12/01/21 1916 (!) 153/62 -- -- 90 -- -- -- --  12/01/21 1901 (!) 144/53 -- -- 90 -- -- -- --  12/01/21 1846 (!) 142/59 -- -- 96 -- -- -- --  12/01/21 1831 132/62 -- -- 86 -- -- -- --  12/01/21 1815 134/77 -- -- 96 -- -- -- --  12/01/21 1801 (!) 120/92 -- -- (!) 101 -- -- -- --  12/01/21 1755 (!) 151/69 -- -- 93 -- -- -- --  12/01/21 1732 (!) 150/65 98.4 F (36.9 C) Oral 94 20 100 % 5\' 4"  (1.626 m) 103 kg   - CNM present at bedside for BP of 166/68. Patient talking and having her blood drawn.   MAU Course  Procedures Orders Placed This Encounter  Procedures   Culture, OB Urine   Urinalysis, Routine w reflex microscopic Urine, Clean Catch   CBC   Comprehensive metabolic panel   Protein / creatinine ratio, urine   Results for orders placed or performed during the hospital encounter of 12/01/21 (from the past 24 hour(s))  Urinalysis, Routine w reflex  microscopic Urine, Clean Catch     Status: Abnormal   Collection Time: 12/01/21  5:56 PM  Result Value Ref Range   Color, Urine YELLOW YELLOW   APPearance CLOUDY (A) CLEAR   Specific Gravity, Urine 1.013 1.005 - 1.030   pH 6.0 5.0 - 8.0   Glucose, UA 50 (A) NEGATIVE mg/dL   Hgb urine dipstick NEGATIVE NEGATIVE   Bilirubin Urine NEGATIVE NEGATIVE   Ketones, ur NEGATIVE NEGATIVE mg/dL   Protein, ur 30 (A) NEGATIVE mg/dL   Nitrite NEGATIVE NEGATIVE   Leukocytes,Ua NEGATIVE NEGATIVE   RBC / HPF 0-5 0 - 5 RBC/hpf   WBC, UA 6-10 0 - 5 WBC/hpf   Bacteria, UA FEW (A) NONE SEEN   Squamous Epithelial / LPF 21-50 0 - 5   Mucus PRESENT    Ca Oxalate Crys, UA PRESENT   Protein / creatinine ratio, urine     Status: Abnormal  Collection Time: 12/01/21  5:56 PM  Result Value Ref Range   Creatinine, Urine 107 mg/dL   Total Protein, Urine 34 mg/dL   Protein Creatinine Ratio 0.32 (H) 0.00 - 0.15 mg/mg[Cre]  CBC     Status: Abnormal   Collection Time: 12/01/21  7:26 PM  Result Value Ref Range   WBC 11.6 (H) 4.0 - 10.5 K/uL   RBC 3.70 (L) 3.87 - 5.11 MIL/uL   Hemoglobin 9.8 (L) 12.0 - 15.0 g/dL   HCT 53.6 (L) 64.4 - 03.4 %   MCV 83.5 80.0 - 100.0 fL   MCH 26.5 26.0 - 34.0 pg   MCHC 31.7 30.0 - 36.0 g/dL   RDW 74.2 59.5 - 63.8 %   Platelets 415 (H) 150 - 400 K/uL   nRBC 0.8 (H) 0.0 - 0.2 %  Comprehensive metabolic panel     Status: Abnormal   Collection Time: 12/01/21  7:26 PM  Result Value Ref Range   Sodium 136 135 - 145 mmol/L   Potassium 3.0 (L) 3.5 - 5.1 mmol/L   Chloride 108 98 - 111 mmol/L   CO2 19 (L) 22 - 32 mmol/L   Glucose, Bld 169 (H) 70 - 99 mg/dL   BUN <5 (L) 6 - 20 mg/dL   Creatinine, Ser 7.56 0.44 - 1.00 mg/dL   Calcium 9.5 8.9 - 43.3 mg/dL   Total Protein 5.7 (L) 6.5 - 8.1 g/dL   Albumin 2.8 (L) 3.5 - 5.0 g/dL   AST 16 15 - 41 U/L   ALT 11 0 - 44 U/L   Alkaline Phosphatase 119 38 - 126 U/L   Total Bilirubin 0.3 0.3 - 1.2 mg/dL   GFR, Estimated >29 >51 mL/min    Anion gap 9 5 - 15     MDM  - UA reflexed to culture.  - PCR 0.35, improved from 0.44 2 weeks ago.  - Liver Enzymes Normal  - Low suspicion for HELLP - BPs Elevated in MAU on 400mg  Labetalol TID.  - Continued Dx of PreEclampsia   - @ 9:16 PM- Medication received in delay. PO Flexeril given. Plan to reassess headache, currently remains a 5/10.   - @ 2100 Transfer of care performed. Report given to R. [id number], CNM.   Arita Miss, CNM 12/01/2021 9:05 PM     Reassessment @ 2130: H/A resolved. Ready for d/c home   Assessment & Plan Elevated blood pressure affecting pregnancy in third trimester, antepartum - Continue HTN medications as prescribed - Elevate BLE as much as possible  [redacted] weeks gestation of pregnancy   - Discharge patient - Keep scheduled appt with [facility] on 12/05/2021 Patient verbalized an understanding of the plan of care and agrees.  12/07/2021, CNM  12/01/2021 9:51 PM

## 2021-12-02 ENCOUNTER — Encounter: Payer: No Typology Code available for payment source | Admitting: Obstetrics & Gynecology

## 2021-12-02 LAB — COMPREHENSIVE METABOLIC PANEL
ALT: 8 IU/L (ref 0–32)
AST: 13 IU/L (ref 0–40)
Albumin/Globulin Ratio: 1.4 (ref 1.2–2.2)
Albumin: 3.6 g/dL — ABNORMAL LOW (ref 3.9–4.9)
Alkaline Phosphatase: 143 IU/L — ABNORMAL HIGH (ref 44–121)
BUN/Creatinine Ratio: 5 — ABNORMAL LOW (ref 9–23)
BUN: 3 mg/dL — ABNORMAL LOW (ref 6–20)
Bilirubin Total: <0.2 mg/dL (ref 0.0–1.2)
CO2: 19 mmol/L — ABNORMAL LOW (ref 20–29)
Calcium: 9.5 mg/dL (ref 8.7–10.2)
Chloride: 101 mmol/L (ref 96–106)
Creatinine, Ser: 0.58 mg/dL (ref 0.57–1.00)
Globulin, Total: 2.6 g/dL (ref 1.5–4.5)
Glucose: 145 mg/dL — ABNORMAL HIGH (ref 70–99)
Potassium: 3.5 mmol/L (ref 3.5–5.2)
Sodium: 135 mmol/L (ref 134–144)
Total Protein: 6.2 g/dL (ref 6.0–8.5)
eGFR: 122 mL/min/{1.73_m2} (ref >59)

## 2021-12-02 LAB — CBC
Hematocrit: 30.5 % — ABNORMAL LOW (ref 34.0–46.6)
Hemoglobin: 9.9 g/dL — ABNORMAL LOW (ref 11.1–15.9)
MCH: 26.3 pg — ABNORMAL LOW (ref 26.6–33.0)
MCHC: 32.5 g/dL (ref 31.5–35.7)
MCV: 81 fL (ref 79–97)
NRBC: 1 % — ABNORMAL HIGH (ref 0–0)
Platelets: 425 10*3/uL (ref 150–450)
RBC: 3.77 x10E6/uL (ref 3.77–5.28)
RDW: 14.4 % (ref 11.7–15.4)
WBC: 12.5 10*3/uL — ABNORMAL HIGH (ref 3.4–10.8)

## 2021-12-02 LAB — PROTEIN / CREATININE RATIO, URINE
Creatinine, Urine: 81.8 mg/dL
Protein, Ur: 36 mg/dL
Protein/Creat Ratio: 440 mg/g creat — ABNORMAL HIGH (ref 0–200)

## 2021-12-02 LAB — CULTURE, OB URINE

## 2021-12-05 ENCOUNTER — Encounter: Payer: No Typology Code available for payment source | Admitting: Obstetrics & Gynecology

## 2021-12-08 ENCOUNTER — Ambulatory Visit: Payer: No Typology Code available for payment source

## 2021-12-08 ENCOUNTER — Ambulatory Visit: Payer: No Typology Code available for payment source | Attending: Obstetrics and Gynecology | Admitting: *Deleted

## 2021-12-08 ENCOUNTER — Ambulatory Visit (HOSPITAL_BASED_OUTPATIENT_CLINIC_OR_DEPARTMENT_OTHER): Payer: No Typology Code available for payment source | Admitting: *Deleted

## 2021-12-08 VITALS — BP 148/60 | HR 89

## 2021-12-08 DIAGNOSIS — Z3A34 34 weeks gestation of pregnancy: Secondary | ICD-10-CM | POA: Insufficient documentation

## 2021-12-08 DIAGNOSIS — O09299 Supervision of pregnancy with other poor reproductive or obstetric history, unspecified trimester: Secondary | ICD-10-CM | POA: Insufficient documentation

## 2021-12-08 DIAGNOSIS — Z141 Cystic fibrosis carrier: Secondary | ICD-10-CM

## 2021-12-08 DIAGNOSIS — O0990 Supervision of high risk pregnancy, unspecified, unspecified trimester: Secondary | ICD-10-CM

## 2021-12-08 DIAGNOSIS — O99213 Obesity complicating pregnancy, third trimester: Secondary | ICD-10-CM

## 2021-12-08 DIAGNOSIS — O10913 Unspecified pre-existing hypertension complicating pregnancy, third trimester: Secondary | ICD-10-CM | POA: Insufficient documentation

## 2021-12-08 DIAGNOSIS — Z86711 Personal history of pulmonary embolism: Secondary | ICD-10-CM | POA: Insufficient documentation

## 2021-12-08 DIAGNOSIS — O3433 Maternal care for cervical incompetence, third trimester: Secondary | ICD-10-CM | POA: Diagnosis not present

## 2021-12-08 DIAGNOSIS — O34219 Maternal care for unspecified type scar from previous cesarean delivery: Secondary | ICD-10-CM | POA: Diagnosis present

## 2021-12-08 NOTE — Procedures (Signed)
Kimberly Stark 08/26/1990 [redacted]w[redacted]d  Fetus A Non-Stress Test Interpretation for 12/08/21  Indication: Chronic Hypertenstion  Fetal Heart Rate A Mode: External Baseline Rate (A): 135 bpm Variability: Moderate Accelerations: 15 x 15 Decelerations: None Multiple birth?: No  Uterine Activity Mode: Palpation, Toco Contraction Frequency (min): 1 uc Contraction Duration (sec): 100 Contraction Quality: Mild Resting Tone Palpated: Relaxed Resting Time: Adequate  Interpretation (Fetal Testing) Nonstress Test Interpretation: Reactive Overall Impression: Reassuring for gestational age Comments: Dr. Annamaria Boots reviewed tracing

## 2021-12-13 ENCOUNTER — Encounter: Payer: Self-pay | Admitting: Neurology

## 2021-12-13 ENCOUNTER — Ambulatory Visit (INDEPENDENT_AMBULATORY_CARE_PROVIDER_SITE_OTHER): Payer: Medicaid Other | Admitting: Neurology

## 2021-12-13 VITALS — BP 147/81 | HR 83 | Ht 64.0 in | Wt 230.0 lb

## 2021-12-13 DIAGNOSIS — Q0700 Arnold-Chiari syndrome without spina bifida or hydrocephalus: Secondary | ICD-10-CM | POA: Insufficient documentation

## 2021-12-13 NOTE — Progress Notes (Signed)
Chief Complaint  Patient presents with   New Patient (Initial Visit)    Rm 15  NP internal referral for H/o type 1 chairi malformation, currently 33wks pregnancy. Needs clearance for vaginal delivery and epidural placement 33 WEEKS today       ASSESSMENT AND PLAN  Kimberly Stark is a 31 y.o. female   Reported history of Arnold-Chiari malformation, Elective C-section is planned for December 28, 2021  MRI of the brain for evaluation   Migraine headache Rapid weight gain  Advised to follow-up with ophthalmology regularly to rule out intracranial hypertension  DIAGNOSTIC DATA (LABS, IMAGING, TESTING) - I reviewed patient records, labs, notes, testing and imaging myself where available. Laboratory evaluation in Nov 11 2021,  MEDICAL HISTORY:  Kimberly Stark is a 31 year old female, seen in request by Dr. Marshall Bing, evaluation of Arnold-Chiari malformation, she is accompanied by her husband at today's visit October 70 2023  I reviewed and summarized the referring note. PMHx. History of DVT, PE HTN Depression  She is currently [redacted] weeks pregnant, planning on elective C-section December 28, 2021, this is her third pregnancy, vaginal delivery 9 years ago, elective C-section 2 years ago, previous 2 children were premature, they were born at 83, 34 weeks respectively  She just moved to [facility] in November 2022, carries a diagnosis of bipolar disorder, prior to her most recent pregnancy, she was taking Depakote 750 mg twice a day, was stopped after she found pregnant at week 8  She complains of frequent headaches, often starting at the right side spreading forward become pounding headache, with light noise sensitivity, nauseous, lasting for few hours, 2-3 times each week,  She also carries a diagnosis of Arnold-Chiari malformation, reported currently due to each delivery, she often get MRI of the brain to check out, hope to do so as well  She has gained 100 pound, denies  significant visual loss, recently evaluated by ophthalmologist at the [place],  PHYSICAL EXAM:   Vitals:   12/13/21 1406  BP: (!) 147/81  Pulse: 83  Weight: 230 lb (104.3 kg)  Height: 5\' 4"  (1.626 m)    Body mass index is 39.48 kg/m.  PHYSICAL EXAMNIATION:  Gen: NAD, conversant, well nourised, well groomed                     Cardiovascular: Regular rate rhythm, no peripheral edema, warm, nontender. Eyes: Conjunctivae clear without exudates or hemorrhage Neck: Supple, no carotid bruits. Pulmonary: Clear to auscultation bilaterally   NEUROLOGICAL EXAM:  MENTAL STATUS: Speech/cognition: Awake, alert, oriented to history taking and casual conversation CRANIAL NERVES: CN II: Visual fields are full to confrontation. Pupils are round equal and briskly reactive to light. CN III, IV, VI: extraocular movement are normal. No ptosis. CN V: Facial sensation is intact to light touch CN VII: Face is symmetric with normal eye closure  CN VIII: Hearing is normal to causal conversation. CN IX, X: Phonation is normal. CN XI: Head turning and shoulder shrug are intact  MOTOR: There is no pronator drift of out-stretched arms. Muscle bulk and tone are normal. Muscle strength is normal.  REFLEXES: Reflexes are 2+ and symmetric at the biceps, triceps, knees, and ankles. Plantar responses are flexor.  SENSORY: Intact to light touch, pinprick and vibratory sensation are intact in fingers and toes.  COORDINATION: There is no trunk or limb dysmetria noted.  GAIT/STANCE: Posture is normal. Gait is steady with normal steps, base, arm swing, and turning. Heel and toe walking  are normal. Tandem gait is normal.  Romberg is absent.  REVIEW OF SYSTEMS:  Full 14 system review of systems performed and notable only for as above All other review of systems were negative.   ALLERGIES: Allergies  Allergen Reactions   Iodinated Contrast Media Shortness Of Breath, Rash and Swelling   Latex Anaphylaxis    Shellfish Allergy Anaphylaxis   Iodine Hives   Penicillins Hives    HOME MEDICATIONS: Current Outpatient Medications  Medication Sig Dispense Refill   aspirin EC 81 MG tablet Take 1 tablet (81 mg total) by mouth daily. Swallow whole. 30 tablet 11   Cholecalciferol (VITAMIN D3 PO) Take by mouth.     enoxaparin (LOVENOX) 40 MG/0.4ML injection Inject 0.4 mLs (40 mg total) into the skin daily. 12 mL 8   famotidine (PEPCID) 20 MG tablet Take 1 tablet (20 mg total) by mouth 2 (two) times daily. 60 tablet 2   ferrous sulfate 325 (65 FE) MG EC tablet Take 325 mg by mouth 3 (three) times daily with meals.     labetalol (NORMODYNE) 200 MG tablet Take 2 tablets (400 mg total) by mouth 3 (three) times daily. 90 tablet 0   nicotine polacrilex (COMMIT) 2 MG lozenge DISSOLVE 1 LOZENGE IN MOUTH EVERY FOUR HOURS AS NEEDED     ondansetron (ZOFRAN) 4 MG tablet Take 1 tablet (4 mg total) by mouth every 8 (eight) hours as needed for nausea or vomiting. 20 tablet 0   Prenatal MV & Min w/FA-DHA (PRENATAL GUMMIES PO) Take by mouth.     sertraline (ZOLOFT) 100 MG tablet TAKE ONE-HALF TABLET BY MOUTH IN THE MORNING FOR 3 DAYS, THEN TAKE ONE TABLET IN THE MORNING FOR 30 DAYS FOR MENTAL HEALTH     No current facility-administered medications for this visit.    PAST MEDICAL HISTORY: Past Medical History:  Diagnosis Date   Asthma    Bipolar 1 disorder (HCC)    Chiari malformation type I (HCC)    History of pulmonary embolism 2021   in setting of covid   Pregnancy induced hypertension    Preterm labor    UTI (urinary tract infection)     PAST SURGICAL HISTORY: Past Surgical History:  Procedure Laterality Date   CERVICAL BIOPSY  W/ LOOP ELECTRODE EXCISION     CERVICAL CERCLAGE     CESAREAN SECTION     DILATION AND CURETTAGE OF UTERUS      FAMILY HISTORY: Family History  Problem Relation Age of Onset   Stroke Mother    Heart disease Mother        hx open heart issues   Hypertension Mother     Diabetes Mother    Cancer Father        started as colon, w/mets   Kidney disease Father        renal failure   Diabetes Father    Cancer Maternal Grandmother    Diabetes Paternal Grandmother     SOCIAL HISTORY: Social History   Socioeconomic History   Marital status: Single    Spouse name: Not on file   Number of children: Not on file   Years of education: Not on file   Highest education level: Bachelor's degree (e.g., BA, AB, BS)  Occupational History   Not on file  Tobacco Use   Smoking status: Former    Packs/day: 0.25    Types: Cigarettes   Smokeless tobacco: Never  Vaping Use   Vaping Use: Former   Substances: Nicotine  Substance and Sexual Activity   Alcohol use: Not Currently   Drug use: Not Currently   Sexual activity: Not Currently  Other Topics Concern   Not on file  Social History Narrative   Not on file   Social Determinants of Health   Financial Resource Strain: High Risk (10/19/2021)   Overall Financial Resource Strain (CARDIA)    Difficulty of Paying Living Expenses: Very hard  Food Insecurity: No Food Insecurity (12/01/2021)   Hunger Vital Sign    Worried About Running Out of Food in the Last Year: Never true    Ran Out of Food in the Last Year: Never true  Recent Concern: Food Insecurity - Food Insecurity Present (10/19/2021)   Hunger Vital Sign    Worried About Running Out of Food in the Last Year: Sometimes true    Ran Out of Food in the Last Year: Sometimes true  Transportation Needs: No Transportation Needs (12/01/2021)   PRAPARE - Hydrologist (Medical): No    Lack of Transportation (Non-Medical): No  Recent Concern: Transportation Needs - Unmet Transportation Needs (10/19/2021)   PRAPARE - Transportation    Lack of Transportation (Medical): Yes    Lack of Transportation (Non-Medical): Yes  Physical Activity: Insufficiently Active (10/19/2021)   Exercise Vital Sign    Days of Exercise per Week: 2 days    Minutes of  Exercise per Session: 30 min  Stress: Stress Concern Present (10/19/2021)   Duck    Feeling of Stress : Rather much  Social Connections: Moderately Integrated (10/19/2021)   Social Connection and Isolation Panel [NHANES]    Frequency of Communication with Friends and Family: Once a week    Frequency of Social Gatherings with Friends and Family: Three times a week    Attends Religious Services: More than 4 times per year    Active Member of Clubs or Organizations: No    Attends Archivist Meetings: Not on file    Marital Status: Living with partner  Intimate Partner Violence: Not on file      Marcial Pacas, M.D. Ph.D.  [facility] [address], [place], [place] [address] Ph: [phone] Fax: [phone]  CC:  Aletha Halim, MD [place],  [place] [address]  Default, Provider, MD

## 2021-12-15 ENCOUNTER — Ambulatory Visit: Payer: No Typology Code available for payment source | Admitting: *Deleted

## 2021-12-15 ENCOUNTER — Other Ambulatory Visit: Payer: Self-pay

## 2021-12-15 ENCOUNTER — Ambulatory Visit (INDEPENDENT_AMBULATORY_CARE_PROVIDER_SITE_OTHER): Payer: No Typology Code available for payment source | Admitting: Obstetrics and Gynecology

## 2021-12-15 ENCOUNTER — Encounter: Payer: Self-pay | Admitting: Obstetrics and Gynecology

## 2021-12-15 ENCOUNTER — Ambulatory Visit: Payer: No Typology Code available for payment source | Attending: Obstetrics and Gynecology

## 2021-12-15 VITALS — BP 137/74 | Wt 224.9 lb

## 2021-12-15 VITALS — BP 137/74

## 2021-12-15 DIAGNOSIS — Z141 Cystic fibrosis carrier: Secondary | ICD-10-CM | POA: Diagnosis present

## 2021-12-15 DIAGNOSIS — Z86711 Personal history of pulmonary embolism: Secondary | ICD-10-CM | POA: Diagnosis not present

## 2021-12-15 DIAGNOSIS — O34219 Maternal care for unspecified type scar from previous cesarean delivery: Secondary | ICD-10-CM | POA: Diagnosis present

## 2021-12-15 DIAGNOSIS — O0990 Supervision of high risk pregnancy, unspecified, unspecified trimester: Secondary | ICD-10-CM | POA: Insufficient documentation

## 2021-12-15 DIAGNOSIS — G935 Compression of brain: Secondary | ICD-10-CM

## 2021-12-15 DIAGNOSIS — Z98891 History of uterine scar from previous surgery: Secondary | ICD-10-CM

## 2021-12-15 DIAGNOSIS — O10913 Unspecified pre-existing hypertension complicating pregnancy, third trimester: Secondary | ICD-10-CM | POA: Diagnosis not present

## 2021-12-15 DIAGNOSIS — I10 Essential (primary) hypertension: Secondary | ICD-10-CM

## 2021-12-15 DIAGNOSIS — O283 Abnormal ultrasonic finding on antenatal screening of mother: Secondary | ICD-10-CM

## 2021-12-15 DIAGNOSIS — O09299 Supervision of pregnancy with other poor reproductive or obstetric history, unspecified trimester: Secondary | ICD-10-CM | POA: Diagnosis not present

## 2021-12-15 DIAGNOSIS — O3433 Maternal care for cervical incompetence, third trimester: Secondary | ICD-10-CM | POA: Diagnosis present

## 2021-12-15 NOTE — Progress Notes (Signed)
Pt describes leaking while in the shower, and a little bit more when she got out. She put on a panty liner and took a nap and noticed just a sm amt after she woke up. No leaking noted today. Notified her Dr. And was told to come in today.

## 2021-12-15 NOTE — Procedures (Signed)
Kimberly Stark 12-21-1990 [redacted]w[redacted]d  Fetus A Non-Stress Test Interpretation for 12/15/21  Indication: Unsatisfactory BPP  Fetal Heart Rate A Mode: External Baseline Rate (A): 135 bpm Variability: Moderate Accelerations: 15 x 15 Decelerations: Variable Multiple birth?: No  Uterine Activity Mode: Palpation, Toco Contraction Frequency (min): 4 ucs with ui Contraction Duration (sec): 50-120 Contraction Quality: Mild Resting Tone Palpated: Relaxed Resting Time: Adequate  Interpretation (Fetal Testing) Nonstress Test Interpretation: Reactive Overall Impression: Reassuring for gestational age Comments: Dr. Annamaria Boots reviewed tracing

## 2021-12-15 NOTE — Progress Notes (Signed)
Subjective:  Kimberly Stark is a 31 y.o. [id number] at [redacted]w[redacted]d being seen today for ongoing prenatal care.  She is currently monitored for the following issues for this high-risk pregnancy and has History of HELLP syndrome, currently pregnant, third trimester; Supervision of high risk pregnancy, antepartum; History of cervical cerclage; History of pulmonary embolus (PE); Tobacco abuse; History of C-section; Cystic fibrosis carrier; Chronic hypertension; Migraine with typical aura; Mild dysplasia of cervix (CIN I); Insomnia; Glaucoma; Chronic post-traumatic stress disorder; History of LEEP (loop electrosurgical excision procedure) of cervix complicating pregnancy in third trimester; BMI 54.2-70.6,CBJSE; Obesity complicating pregnancy in third trimester; History of preterm delivery, currently pregnant in third trimester; Chiari malformation type I ([place]); Anticoagulated; and Arnold-Chiari malformation (HCC) on their problem list.  Patient reports general discomforts of pregnancy.  Contractions: Not present. Vag. Bleeding: None.  Movement: Present. Denies leaking of fluid.   The following portions of the patient's history were reviewed and updated as appropriate: allergies, current medications, past family history, past medical history, past social history, past surgical history and problem list. Problem list updated.  Objective:   Vitals:   12/15/21 1525  BP: 137/74  Weight: 224 lb 14.4 oz (102 kg)    Fetal Status: Fetal Heart Rate (bpm): 136   Movement: Present     General:  Alert, oriented and cooperative. Patient is in no acute distress.  Skin: Skin is warm and dry. No rash noted.   Cardiovascular: Normal heart rate noted  Respiratory: Normal respiratory effort, no problems with respiration noted  Abdomen: Soft, gravid, appropriate for gestational age. Pain/Pressure: Present     Pelvic:  Cervical exam deferred        Extremities: Normal range of motion.  Edema: Trace  Mental Status: Normal mood  and affect. Normal behavior. Normal judgment and thought content.   Urinalysis:      Assessment and Plan:  Pregnancy: [id number] at [redacted]w[redacted]d  1. Supervision of high risk pregnancy, antepartum Stable GBS next visit  2. Chronic hypertension BP controlled with current treatment Continue with serial growth scans and antenatal testing as per MFM Repeat c section scheduled  3. History of C-section For repeat  4. Chiari malformation type I [facility]) Followed Neurology  5. History of pulmonary embolus (PE) Continue with Lovenox  Preterm labor symptoms and general obstetric precautions including but not limited to vaginal bleeding, contractions, leaking of fluid and fetal movement were reviewed in detail with the patient. Please refer to After Visit Summary for other counseling recommendations.  Return in about 1 week (around 12/22/2021) for OB visit, face to face, MD only.   Chancy Milroy, MD

## 2021-12-15 NOTE — Patient Instructions (Signed)

## 2021-12-16 ENCOUNTER — Encounter (HOSPITAL_COMMUNITY): Payer: Self-pay

## 2021-12-16 NOTE — Patient Instructions (Addendum)
Jhordyn Hoopingarner  12/16/2021   Your procedure is scheduled on:  12/28/2021  Arrive at 0830 at Entrance C on [address] at [facility]  and [employer]. You are invited to use the FREE valet parking or use the Visitor's parking deck.  Pick up the phone at the desk and dial [phone].  Call this number if you have problems the morning of surgery: [phone]  Remember:   Do not eat food:(After Midnight) Desps de medianoche.  Do not drink clear liquids: (After Midnight) Desps de medianoche.  Take these medicines the morning of surgery with A SIP OF WATER:  Take lovenox no later than 9:00PM the night before surgery.take labetalol as prescribed. Take zoloft as prescribed   Do not wear jewelry, make-up or nail polish.  Do not wear lotions, powders, or perfumes. Do not wear deodorant.  Do not shave 48 hours prior to surgery.  Do not bring valuables to the hospital.  [facility] is not   responsible for any belongings or valuables brought to the hospital.  Contacts, dentures or bridgework may not be worn into surgery.  Leave suitcase in the car. After surgery it may be brought to your room.  For patients admitted to the hospital, checkout time is 11:00 AM the day of              discharge.      Please read over the following fact sheets that you were given:     Preparing for Surgery

## 2021-12-22 ENCOUNTER — Observation Stay (HOSPITAL_COMMUNITY)
Admission: AD | Admit: 2021-12-22 | Discharge: 2021-12-23 | Disposition: A | Discharge status: Home / Self Care | Payer: No Typology Code available for payment source | Attending: Obstetrics & Gynecology | Admitting: Obstetrics & Gynecology

## 2021-12-22 ENCOUNTER — Telehealth: Payer: Self-pay | Admitting: Neurology

## 2021-12-22 ENCOUNTER — Ambulatory Visit (HOSPITAL_BASED_OUTPATIENT_CLINIC_OR_DEPARTMENT_OTHER): Payer: No Typology Code available for payment source

## 2021-12-22 ENCOUNTER — Encounter (HOSPITAL_COMMUNITY): Payer: Self-pay | Admitting: Obstetrics & Gynecology

## 2021-12-22 ENCOUNTER — Inpatient Hospital Stay (HOSPITAL_COMMUNITY): Payer: No Typology Code available for payment source

## 2021-12-22 ENCOUNTER — Encounter: Payer: Self-pay | Admitting: *Deleted

## 2021-12-22 ENCOUNTER — Ambulatory Visit: Payer: No Typology Code available for payment source | Admitting: *Deleted

## 2021-12-22 VITALS — BP 137/73 | HR 81

## 2021-12-22 DIAGNOSIS — Z3A36 36 weeks gestation of pregnancy: Secondary | ICD-10-CM | POA: Diagnosis not present

## 2021-12-22 DIAGNOSIS — O09299 Supervision of pregnancy with other poor reproductive or obstetric history, unspecified trimester: Secondary | ICD-10-CM | POA: Insufficient documentation

## 2021-12-22 DIAGNOSIS — Z8616 Personal history of COVID-19: Secondary | ICD-10-CM | POA: Insufficient documentation

## 2021-12-22 DIAGNOSIS — Z86711 Personal history of pulmonary embolism: Secondary | ICD-10-CM

## 2021-12-22 DIAGNOSIS — J45909 Unspecified asthma, uncomplicated: Secondary | ICD-10-CM | POA: Diagnosis not present

## 2021-12-22 DIAGNOSIS — Z7901 Long term (current) use of anticoagulants: Secondary | ICD-10-CM | POA: Insufficient documentation

## 2021-12-22 DIAGNOSIS — O09893 Supervision of other high risk pregnancies, third trimester: Secondary | ICD-10-CM | POA: Insufficient documentation

## 2021-12-22 DIAGNOSIS — R109 Unspecified abdominal pain: Secondary | ICD-10-CM | POA: Diagnosis not present

## 2021-12-22 DIAGNOSIS — S99912A Unspecified injury of left ankle, initial encounter: Secondary | ICD-10-CM | POA: Insufficient documentation

## 2021-12-22 DIAGNOSIS — O3440 Maternal care for other abnormalities of cervix, unspecified trimester: Secondary | ICD-10-CM | POA: Insufficient documentation

## 2021-12-22 DIAGNOSIS — R519 Headache, unspecified: Secondary | ICD-10-CM | POA: Diagnosis not present

## 2021-12-22 DIAGNOSIS — Z7982 Long term (current) use of aspirin: Secondary | ICD-10-CM | POA: Insufficient documentation

## 2021-12-22 DIAGNOSIS — O2620 Pregnancy care for patient with recurrent pregnancy loss, unspecified trimester: Secondary | ICD-10-CM | POA: Insufficient documentation

## 2021-12-22 DIAGNOSIS — O10013 Pre-existing essential hypertension complicating pregnancy, third trimester: Secondary | ICD-10-CM | POA: Diagnosis not present

## 2021-12-22 DIAGNOSIS — W0110XA Fall on same level from slipping, tripping and stumbling with subsequent striking against unspecified object, initial encounter: Secondary | ICD-10-CM | POA: Diagnosis not present

## 2021-12-22 DIAGNOSIS — Y9301 Activity, walking, marching and hiking: Secondary | ICD-10-CM | POA: Insufficient documentation

## 2021-12-22 DIAGNOSIS — Z91040 Latex allergy status: Secondary | ICD-10-CM | POA: Insufficient documentation

## 2021-12-22 DIAGNOSIS — Z141 Cystic fibrosis carrier: Secondary | ICD-10-CM

## 2021-12-22 DIAGNOSIS — O99213 Obesity complicating pregnancy, third trimester: Secondary | ICD-10-CM | POA: Insufficient documentation

## 2021-12-22 DIAGNOSIS — O26899 Other specified pregnancy related conditions, unspecified trimester: Secondary | ICD-10-CM

## 2021-12-22 DIAGNOSIS — Z9889 Other specified postprocedural states: Secondary | ICD-10-CM | POA: Insufficient documentation

## 2021-12-22 DIAGNOSIS — J069 Acute upper respiratory infection, unspecified: Secondary | ICD-10-CM | POA: Diagnosis not present

## 2021-12-22 DIAGNOSIS — O10913 Unspecified pre-existing hypertension complicating pregnancy, third trimester: Secondary | ICD-10-CM | POA: Insufficient documentation

## 2021-12-22 DIAGNOSIS — O34219 Maternal care for unspecified type scar from previous cesarean delivery: Secondary | ICD-10-CM

## 2021-12-22 DIAGNOSIS — Z1152 Encounter for screening for COVID-19: Secondary | ICD-10-CM | POA: Diagnosis not present

## 2021-12-22 DIAGNOSIS — W19XXXA Unspecified fall, initial encounter: Secondary | ICD-10-CM | POA: Diagnosis present

## 2021-12-22 DIAGNOSIS — F17210 Nicotine dependence, cigarettes, uncomplicated: Secondary | ICD-10-CM | POA: Insufficient documentation

## 2021-12-22 DIAGNOSIS — Z98891 History of uterine scar from previous surgery: Secondary | ICD-10-CM | POA: Diagnosis not present

## 2021-12-22 DIAGNOSIS — O99891 Other specified diseases and conditions complicating pregnancy: Secondary | ICD-10-CM | POA: Diagnosis not present

## 2021-12-22 DIAGNOSIS — O26893 Other specified pregnancy related conditions, third trimester: Secondary | ICD-10-CM | POA: Insufficient documentation

## 2021-12-22 DIAGNOSIS — Z79899 Other long term (current) drug therapy: Secondary | ICD-10-CM | POA: Insufficient documentation

## 2021-12-22 DIAGNOSIS — O9A213 Injury, poisoning and certain other consequences of external causes complicating pregnancy, third trimester: Secondary | ICD-10-CM | POA: Diagnosis not present

## 2021-12-22 DIAGNOSIS — O99333 Smoking (tobacco) complicating pregnancy, third trimester: Secondary | ICD-10-CM | POA: Diagnosis not present

## 2021-12-22 DIAGNOSIS — O10919 Unspecified pre-existing hypertension complicating pregnancy, unspecified trimester: Secondary | ICD-10-CM | POA: Diagnosis not present

## 2021-12-22 DIAGNOSIS — O99513 Diseases of the respiratory system complicating pregnancy, third trimester: Secondary | ICD-10-CM | POA: Diagnosis not present

## 2021-12-22 DIAGNOSIS — O0990 Supervision of high risk pregnancy, unspecified, unspecified trimester: Secondary | ICD-10-CM | POA: Insufficient documentation

## 2021-12-22 DIAGNOSIS — R55 Syncope and collapse: Secondary | ICD-10-CM | POA: Diagnosis not present

## 2021-12-22 LAB — URINALYSIS, ROUTINE W REFLEX MICROSCOPIC
Bilirubin Urine: NEGATIVE
Glucose, UA: 50 mg/dL — AB
Hgb urine dipstick: NEGATIVE
Ketones, ur: NEGATIVE mg/dL
Nitrite: NEGATIVE
Protein, ur: 100 mg/dL — AB
Specific Gravity, Urine: 1.011 (ref 1.005–1.030)
pH: 6 (ref 5.0–8.0)

## 2021-12-22 LAB — CBC
HCT: 32.9 % — ABNORMAL LOW (ref 36.0–46.0)
Hemoglobin: 10.2 g/dL — ABNORMAL LOW (ref 12.0–15.0)
MCH: 25.2 pg — ABNORMAL LOW (ref 26.0–34.0)
MCHC: 31 g/dL (ref 30.0–36.0)
MCV: 81.4 fL (ref 80.0–100.0)
Platelets: 423 10*3/uL — ABNORMAL HIGH (ref 150–400)
RBC: 4.04 MIL/uL (ref 3.87–5.11)
RDW: 15 % (ref 11.5–15.5)
WBC: 10.7 10*3/uL — ABNORMAL HIGH (ref 4.0–10.5)
nRBC: 1.2 % — ABNORMAL HIGH (ref 0.0–0.2)

## 2021-12-22 LAB — COMPREHENSIVE METABOLIC PANEL
ALT: 11 U/L (ref 0–44)
AST: 15 U/L (ref 15–41)
Albumin: 2.9 g/dL — ABNORMAL LOW (ref 3.5–5.0)
Alkaline Phosphatase: 160 U/L — ABNORMAL HIGH (ref 38–126)
Anion gap: 10 (ref 5–15)
BUN: <5 mg/dL — ABNORMAL LOW (ref 6–20)
CO2: 18 mmol/L — ABNORMAL LOW (ref 22–32)
Calcium: 9.5 mg/dL (ref 8.9–10.3)
Chloride: 107 mmol/L (ref 98–111)
Creatinine, Ser: 0.52 mg/dL (ref 0.44–1.00)
GFR, Estimated: >60 mL/min (ref >60)
Glucose, Bld: 103 mg/dL — ABNORMAL HIGH (ref 70–99)
Potassium: 3.3 mmol/L — ABNORMAL LOW (ref 3.5–5.1)
Sodium: 135 mmol/L (ref 135–145)
Total Bilirubin: 0.4 mg/dL (ref 0.3–1.2)
Total Protein: 6 g/dL — ABNORMAL LOW (ref 6.5–8.1)

## 2021-12-22 LAB — PROTEIN / CREATININE RATIO, URINE
Creatinine, Urine: 97 mg/dL
Protein Creatinine Ratio: 0.49 mg/mg{Cre} — ABNORMAL HIGH (ref 0.00–0.15)
Total Protein, Urine: 48 mg/dL

## 2021-12-22 MED ORDER — DOCUSATE SODIUM 100 MG PO CAPS: 100.0000 mg | ORAL_CAPSULE | Freq: Every day | ORAL | Status: DC

## 2021-12-22 MED ORDER — PRENATAL MULTIVITAMIN CH: 1.0000 | ORAL_TABLET | Freq: Every day | ORAL | Status: DC

## 2021-12-22 MED ORDER — LACTATED RINGERS IV SOLN: INTRAVENOUS | Status: DC

## 2021-12-22 MED ORDER — CALCIUM CARBONATE ANTACID 500 MG PO CHEW: 2.0000 | CHEWABLE_TABLET | ORAL | Status: DC | PRN

## 2021-12-22 MED ORDER — METOCLOPRAMIDE HCL 5 MG/ML IJ SOLN
5.0000 mg | Freq: Once | INTRAMUSCULAR | Status: AC
  Administered 2021-12-22: 5 mg via INTRAVENOUS
  Filled 2021-12-22: qty 2

## 2021-12-22 MED ORDER — ACETAMINOPHEN 325 MG PO TABS
650.0000 mg | ORAL_TABLET | ORAL | Status: DC | PRN
  Administered 2021-12-22: 650 mg via ORAL
  Filled 2021-12-22: qty 2

## 2021-12-22 MED ORDER — DIPHENHYDRAMINE HCL 50 MG/ML IJ SOLN
12.5000 mg | Freq: Once | INTRAMUSCULAR | Status: AC
  Administered 2021-12-22: 12.5 mg via INTRAVENOUS
  Filled 2021-12-22: qty 1

## 2021-12-22 NOTE — H&P (Signed)
Expand All Collapse All Chief Complaint:  Fall    Event Date/Time   First Provider Initiated Contact with Patient 12/22/21 2015     HPI: Kimberly Stark is a 31 y.o. [id number] at 55w1dwho presents to maternity admissions reporting having fallen today.  Was walking across the street and became dizzy, saw colors and fainted, landing on her abdomen.  Twisted her left ankle in fall.  Has crampy abdominal pain and a headache.  Has been having nightly vomiting all week but feels fine and eats well during the day. Ate a large lunch. . She reports good fetal movement, denies LOF, vaginal bleeding, vaginal itching/burning, urinary symptoms, n/v, diarrhea, constipation or fever/chills.  She denies visual changes or RUQ abdominal pain.   History is remarkable for Chronic HTN (on Labetalol 400mg  TID), history of HELLP, Hx PE with Covid, history of C/S and Cerclage, and migraine.  Fall The accident occurred 3 to 6 hours ago. The fall occurred while walking. She landed on [place]. There was no blood loss. Point of impact: abdomen. The pain is present in the head (abdominal cramping). Associated symptoms include abdominal pain, headaches and a loss of consciousness (45 seconds). Pertinent negatives include no fever.  Headache  This is a new problem. The current episode started today. The problem occurs constantly. The quality of the pain is described as aching. Associated symptoms include abdominal pain. Pertinent negatives include no blurred vision, fever, muscle aches or photophobia. She has tried nothing for the symptoms.    RN Note: Kimberly Stark is a 31 y.o. at [redacted]w[redacted]d here in MAU reporting: she was walking across the street and started  feeling dizzy seeing black and next thing she knee she was on the ground scraped her  right knee and did hit her stomach.stated she has had cramping and pain and had not felt baby move since the fall. Denies any vag bleeding or leaking.  Pt c/o Headache  as well/ LMP:  Onset of  complaint: 1600pm Pain score: Cramping 5                   Headache 6      Vitals:    12/22/21 1849  BP: (!) 168/78  Pulse: 89  Resp: 18  Temp: 98.1 F (36.7 C)     FHT:128 Lab orders placed from triage:  U/A               Past Medical History:     Past Medical History:  Diagnosis Date   Anemia     Asthma     Bipolar 1 disorder ([place])     Blood transfusion without reported diagnosis     Chiari malformation type I ([place])     Heart murmur     History of postpartum hemorrhage     History of pulmonary embolism 2021    in setting of covid   Pregnancy induced hypertension     Preterm labor     UTI (urinary tract infection)        Past obstetric history:                 OB History  Gravida Para Term Preterm AB Living  11 2 1 1 8 2   SAB IAB Ectopic Multiple Live Births     7 1 0 0 2        # Outcome Date GA Lbr Len/2nd Weight Sex Delivery Anes PTL Lv  11 Current  10 Term 08/13/19 [redacted]w[redacted]d   3714 g   CS-LTranv   Y LIV     Complications: Preeclampsia, Cervical cerclage suture present, unspecified trimester  9 Preterm 11/23/12 [redacted]w[redacted]d   2500 g   Vag-Spont     LIV     Complications: Preeclampsia, History of cervical cerclage  8 IAB                    7 SAB                    6 SAB                    5 SAB                    4 SAB                    3 SAB                    2 SAB                    1 SAB                       Obstetric Comments  Pre-E with both preg, induced with both      Past Surgical History:      Past Surgical History:  Procedure Laterality Date   CERVICAL BIOPSY  W/ LOOP ELECTRODE EXCISION       CERVICAL CERCLAGE       CESAREAN SECTION       DILATION AND CURETTAGE OF UTERUS          Family History:      Family History  Problem Relation Age of Onset   Stroke Mother     Heart disease Mother          hx open heart issues   Hypertension Mother     Diabetes Mother     Cancer Father          started as colon, w/mets    Kidney disease Father          renal failure   Diabetes Father     Cancer Maternal Grandmother     Diabetes Paternal Grandmother        Social History: Social History         Tobacco Use   Smoking status: Some Days      Packs/day: 0.25      Types: Cigarettes   Smokeless tobacco: Never  Vaping Use   Vaping Use: Former   Substances: Nicotine  Substance Use Topics   Alcohol use: Not Currently   Drug use: Not Currently      Allergies:      Allergies  Allergen Reactions   Iodinated Contrast Media Shortness Of Breath, Rash and Swelling   Latex Anaphylaxis   Shellfish Allergy Anaphylaxis   Iodine Hives   Penicillins Hives      Meds:         Medications Prior to Admission  Medication Sig Dispense Refill Last Dose   aspirin EC 81 MG tablet Take 1 tablet (81 mg total) by mouth daily. Swallow whole. 30 tablet 11 12/22/2021   calcium carbonate (TUMS - DOSED IN MG ELEMENTAL CALCIUM) 500 MG chewable tablet Chew 2-3 tablets by mouth 3 (three) times daily as needed for indigestion or heartburn.  12/22/2021   Cholecalciferol (VITAMIN D3 PO) Take 1,000 Units by mouth in the morning.     12/22/2021   enoxaparin (LOVENOX) 40 MG/0.4ML injection Inject 0.4 mLs (40 mg total) into the skin daily. (Patient taking differently: Inject 40 mg into the skin every evening.) 12 mL 8 12/22/2021   famotidine (PEPCID) 20 MG tablet Take 1 tablet (20 mg total) by mouth 2 (two) times daily. (Patient taking differently: Take 20 mg by mouth in the morning.) 60 tablet 2 12/22/2021   ferrous sulfate 325 (65 FE) MG EC tablet Take 325 mg by mouth in the morning.     12/22/2021   labetalol (NORMODYNE) 200 MG tablet Take 2 tablets (400 mg total) by mouth 3 (three) times daily. 90 tablet 0 12/22/2021   ondansetron (ZOFRAN) 4 MG tablet Take 1 tablet (4 mg total) by mouth every 8 (eight) hours as needed for nausea or vomiting. 20 tablet 0 12/22/2021   Prenatal MV & Min w/FA-DHA (PRENATAL GUMMIES PO) Take 2  tablets by mouth in the morning.     12/22/2021   sertraline (ZOLOFT) 100 MG tablet Take 100 mg by mouth in the morning.     12/22/2021   acetaminophen (TYLENOL) 500 MG tablet Take 1,000 mg by mouth every 6 (six) hours as needed (headaches/pain.).         albuterol (VENTOLIN HFA) 108 (90 Base) MCG/ACT inhaler Inhale 1-2 puffs into the lungs every 6 (six) hours as needed for wheezing or shortness of breath.            I have reviewed patient's Past Medical Hx, Surgical Hx, Family Hx, Social Hx, medications and allergies.    ROS:  Review of Systems  Constitutional:  Negative for fever.  Eyes:  Negative for blurred vision and photophobia.  Gastrointestinal:  Positive for abdominal pain.  Neurological:  Positive for loss of consciousness (45 seconds) and headaches.    Other systems negative   Physical Exam  Patient Vitals for the past 24 hrs:   BP Temp Pulse Resp SpO2 Height Weight  12/22/21 2000 (!) 150/76 -- 80 -- 100 % -- --  12/22/21 1950 -- -- -- -- 100 % -- --  12/22/21 1942 (!) 158/73 -- 80 -- 99 % -- --  12/22/21 1930 -- -- -- -- 97 % -- --  12/22/21 1920 -- -- -- -- 100 % -- --  12/22/21 1849 (!) 168/78 98.1 F (36.7 C) 89 18 -- 5\' 4"  (1.626 m) 103 kg    Constitutional: Well-developed, well-nourished female in no acute distress.  Cardiovascular: normal rate and rhythm Respiratory: normal effort, clear to auscultation bilaterally GI: Abd soft, non-tender, gravid appropriate for gestational age.   No rebound or guarding. MS: Extremities nontender, no edema, normal ROM        Left ankle with tenderness over lateral malleolus, no ecchymosis or acute deformity Neurologic: Alert and oriented x 4.  GU: Neg CVAT.   PELVIC EXAM: deferred    FHT:  Baseline 130 , moderate variability, accelerations present, no decelerations Contractions: q 3-5 mins Irregular     Labs: Lab Results Last 24 Hours       Results for orders placed or performed during the hospital encounter of  12/22/21 (from the past 24 hour(s))  Urinalysis, Routine w reflex microscopic Urine, Clean Catch     Status: Abnormal    Collection Time: 12/22/21  6:52 PM  Result Value Ref Range    Color, Urine YELLOW YELLOW  APPearance CLOUDY (A) CLEAR    Specific Gravity, Urine 1.011 1.005 - 1.030    pH 6.0 5.0 - 8.0    Glucose, UA 50 (A) NEGATIVE mg/dL    Hgb urine dipstick NEGATIVE NEGATIVE    Bilirubin Urine NEGATIVE NEGATIVE    Ketones, ur NEGATIVE NEGATIVE mg/dL    Protein, ur 100 (A) NEGATIVE mg/dL    Nitrite NEGATIVE NEGATIVE    Leukocytes,Ua TRACE (A) NEGATIVE    RBC / HPF 0-5 0 - 5 RBC/hpf    WBC, UA 6-10 0 - 5 WBC/hpf    Bacteria, UA MANY (A) NONE SEEN    Squamous Epithelial / LPF 21-50 0 - 5    Mucus PRESENT    CBC     Status: Abnormal    Collection Time: 12/22/21  9:07 PM  Result Value Ref Range    WBC 10.7 (H) 4.0 - 10.5 K/uL    RBC 4.04 3.87 - 5.11 MIL/uL    Hemoglobin 10.2 (L) 12.0 - 15.0 g/dL    HCT 32.9 (L) 36.0 - 46.0 %    MCV 81.4 80.0 - 100.0 fL    MCH 25.2 (L) 26.0 - 34.0 pg    MCHC 31.0 30.0 - 36.0 g/dL    RDW 15.0 11.5 - 15.5 %    Platelets 423 (H) 150 - 400 K/uL    nRBC 1.2 (H) 0.0 - 0.2 %  Comprehensive metabolic panel     Status: Abnormal    Collection Time: 12/22/21  9:07 PM  Result Value Ref Range    Sodium 135 135 - 145 mmol/L    Potassium 3.3 (L) 3.5 - 5.1 mmol/L    Chloride 107 98 - 111 mmol/L    CO2 18 (L) 22 - 32 mmol/L    Glucose, Bld 103 (H) 70 - 99 mg/dL    BUN <5 (L) 6 - 20 mg/dL    Creatinine, Ser 0.52 0.44 - 1.00 mg/dL    Calcium 9.5 8.9 - 10.3 mg/dL    Total Protein 6.0 (L) 6.5 - 8.1 g/dL    Albumin 2.9 (L) 3.5 - 5.0 g/dL    AST 15 15 - 41 U/L    ALT 11 0 - 44 U/L    Alkaline Phosphatase 160 (H) 38 - 126 U/L    Total Bilirubin 0.4 0.3 - 1.2 mg/dL    GFR, Estimated >60 >60 mL/min    Anion gap 10 5 - 15        O/Positive/-- (04/12 1647)   Imaging:  Ankle xray negative   MAU Course/MDM: I have reviewed the triage vital signs and  the nursing notes.   Pertinent labs & imaging results that were available during my care of the patient were reviewed by me and considered in my medical decision making (see chart for details).      I have reviewed her medical records including past results, notes and treatments.    I have ordered labs and reviewed results. Preeclampsia labs are normal  NST reviewed, reactive with irregular contractions Consult Dr Nelda Marseille with presentation, exam findings and test results.  Treatments in MAU included IV, Reglan and Benadryl for headache, xray of left ankle.   Ankle xray is negative Headache improved with Reglan and Benadryl Will admit for observation overnight   Assessment: Single IUP at [redacted]w[redacted]d S/P Fall onto abcomen Cramping, improved Headache, improved Left ankle injury   Plan: Admit for observation Routine orders MD to follow     Hansel Feinstein  CNM, MSN Certified Nurse-Midwife 12/22/2021 8:15 PM        Cosigned by: Janyth Pupa, DO at 12/22/2021 10:42 PM  Electronically signed by Seabron Spates, CNM at 12/22/2021 10:28 PM Electronically signed by Janyth Pupa, DO at 12/22/2021 10:42 PM

## 2021-12-22 NOTE — MAU Note (Signed)
.  Kimberly Stark is a 31 y.o. at [redacted]w[redacted]d here in MAU reporting: she was walking across the street and started  feeling dizzy seeing black and next thing she knee she was on the ground scraped her  right knee and did hit her stomach.stated she has had cramping and pain and had not felt baby move since the fall. Denies any vag bleeding or leaking.  Pt c/o Headache  as well/ LMP:  Onset of complaint: 1600pm Pain score: Cramping 5                   Headache 6 Vitals:   12/22/21 1849  BP: (!) 168/78  Pulse: 89  Resp: 18  Temp: 98.1 F (36.7 C)     FHT:128 Lab orders placed from triage:  U/A

## 2021-12-22 NOTE — Telephone Encounter (Signed)
[employer] medicaid Kimberly Stark: [id number] exp. 12/22/21-01/20/22 sent to GI [phone]

## 2021-12-22 NOTE — MAU Provider Note (Signed)
Chief Complaint:  Fall   Event Date/Time   First Provider Initiated Contact with Patient 12/22/21 2015     HPI: Kimberly Stark is a 31 y.o. [id number] at 50w1dwho presents to maternity admissions reporting having fallen today.  Was walking across the street and became dizzy, saw colors and fainted, landing on her abdomen.  Twisted her left ankle in fall.  Has crampy abdominal pain and a headache.  Has been having nightly vomiting all week but feels fine and eats well during the day. Ate a large lunch. . She reports good fetal movement, denies LOF, vaginal bleeding, vaginal itching/burning, urinary symptoms, n/v, diarrhea, constipation or fever/chills.  She denies visual changes or RUQ abdominal pain.  History is remarkable for Chronic HTN (on Labetalol 400mg  TID), history of HELLP, Hx PE with Covid, history of C/S and Cerclage, and migraine.  Fall The accident occurred 3 to 6 hours ago. The fall occurred while walking. She landed on [place]. There was no blood loss. Point of impact: abdomen. The pain is present in the head (abdominal cramping). Associated symptoms include abdominal pain, headaches and a loss of consciousness (45 seconds). Pertinent negatives include no fever.  Headache  This is a new problem. The current episode started today. The problem occurs constantly. The quality of the pain is described as aching. Associated symptoms include abdominal pain. Pertinent negatives include no blurred vision, fever, muscle aches or photophobia. She has tried nothing for the symptoms.   RN Note: Kimberly Stark is a 31 y.o. at [redacted]w[redacted]d here in MAU reporting: she was walking across the street and started  feeling dizzy seeing black and next thing she knee she was on the ground scraped her  right knee and did hit her stomach.stated she has had cramping and pain and had not felt baby move since the fall. Denies any vag bleeding or leaking.  Pt c/o Headache  as well/ LMP:  Onset of complaint: 1600pm Pain  score: Cramping 5                   Headache 6    Vitals:    12/22/21 1849  BP: (!) 168/78  Pulse: 89  Resp: 18  Temp: 98.1 F (36.7 C)     FHT:128 Lab orders placed from triage:  U/A            Past Medical History: Past Medical History:  Diagnosis Date   Anemia    Asthma    Bipolar 1 disorder ([facility])    Blood transfusion without reported diagnosis    Chiari malformation type I ([facility])    Heart murmur    History of postpartum hemorrhage    History of pulmonary embolism 2021   in setting of covid   Pregnancy induced hypertension    Preterm labor    UTI (urinary tract infection)     Past obstetric history: OB History  Gravida Para Term Preterm AB Living  11 2 1 1 8 2   SAB IAB Ectopic Multiple Live Births  7 1 0 0 2    # Outcome Date GA Lbr Len/2nd Weight Sex Delivery Anes PTL Lv  11 Current           10 Term 08/13/19 [redacted]w[redacted]d  3714 g  CS-LTranv  Y LIV     Complications: Preeclampsia, Cervical cerclage suture present, unspecified trimester  9 Preterm 11/23/12 [redacted]w[redacted]d  2500 g  Vag-Spont   LIV     Complications: Preeclampsia, History of cervical cerclage  8 IAB           7 SAB           6 SAB           5 SAB           4 SAB           3 SAB           2 SAB           1 SAB             Obstetric Comments  Pre-E with both preg, induced with both    Past Surgical History: Past Surgical History:  Procedure Laterality Date   CERVICAL BIOPSY  W/ LOOP ELECTRODE EXCISION     CERVICAL CERCLAGE     CESAREAN SECTION     DILATION AND CURETTAGE OF UTERUS      Family History: Family History  Problem Relation Age of Onset   Stroke Mother    Heart disease Mother        hx open heart issues   Hypertension Mother    Diabetes Mother    Cancer Father        started as colon, w/mets   Kidney disease Father        renal failure   Diabetes Father    Cancer Maternal Grandmother    Diabetes Paternal Grandmother     Social History: Social History   Tobacco Use    Smoking status: Some Days    Packs/day: 0.25    Types: Cigarettes   Smokeless tobacco: Never  Vaping Use   Vaping Use: Former   Substances: Nicotine  Substance Use Topics   Alcohol use: Not Currently   Drug use: Not Currently    Allergies:  Allergies  Allergen Reactions   Iodinated Contrast Media Shortness Of Breath, Rash and Swelling   Latex Anaphylaxis   Shellfish Allergy Anaphylaxis   Iodine Hives   Penicillins Hives    Meds:  Medications Prior to Admission  Medication Sig Dispense Refill Last Dose   aspirin EC 81 MG tablet Take 1 tablet (81 mg total) by mouth daily. Swallow whole. 30 tablet 11 12/22/2021   calcium carbonate (TUMS - DOSED IN MG ELEMENTAL CALCIUM) 500 MG chewable tablet Chew 2-3 tablets by mouth 3 (three) times daily as needed for indigestion or heartburn.   12/22/2021   Cholecalciferol (VITAMIN D3 PO) Take 1,000 Units by mouth in the morning.   12/22/2021   enoxaparin (LOVENOX) 40 MG/0.4ML injection Inject 0.4 mLs (40 mg total) into the skin daily. (Patient taking differently: Inject 40 mg into the skin every evening.) 12 mL 8 12/22/2021   famotidine (PEPCID) 20 MG tablet Take 1 tablet (20 mg total) by mouth 2 (two) times daily. (Patient taking differently: Take 20 mg by mouth in the morning.) 60 tablet 2 12/22/2021   ferrous sulfate 325 (65 FE) MG EC tablet Take 325 mg by mouth in the morning.   12/22/2021   labetalol (NORMODYNE) 200 MG tablet Take 2 tablets (400 mg total) by mouth 3 (three) times daily. 90 tablet 0 12/22/2021   ondansetron (ZOFRAN) 4 MG tablet Take 1 tablet (4 mg total) by mouth every 8 (eight) hours as needed for nausea or vomiting. 20 tablet 0 12/22/2021   Prenatal MV & Min w/FA-DHA (PRENATAL GUMMIES PO) Take 2 tablets by mouth in the morning.   12/22/2021   sertraline (ZOLOFT) 100 MG tablet Take 100 mg by  mouth in the morning.   12/22/2021   acetaminophen (TYLENOL) 500 MG tablet Take 1,000 mg by mouth every 6 (six) hours as needed  (headaches/pain.).      albuterol (VENTOLIN HFA) 108 (90 Base) MCG/ACT inhaler Inhale 1-2 puffs into the lungs every 6 (six) hours as needed for wheezing or shortness of breath.       I have reviewed patient's Past Medical Hx, Surgical Hx, Family Hx, Social Hx, medications and allergies.   ROS:  Review of Systems  Constitutional:  Negative for fever.  Eyes:  Negative for blurred vision and photophobia.  Gastrointestinal:  Positive for abdominal pain.  Neurological:  Positive for loss of consciousness (45 seconds) and headaches.   Other systems negative  Physical Exam  Patient Vitals for the past 24 hrs:  BP Temp Pulse Resp SpO2 Height Weight  12/22/21 2000 (!) 150/76 -- 80 -- 100 % -- --  12/22/21 1950 -- -- -- -- 100 % -- --  12/22/21 1942 (!) 158/73 -- 80 -- 99 % -- --  12/22/21 1930 -- -- -- -- 97 % -- --  12/22/21 1920 -- -- -- -- 100 % -- --  12/22/21 1849 (!) 168/78 98.1 F (36.7 C) 89 18 -- 5\' 4"  (1.626 m) 103 kg   Constitutional: Well-developed, well-nourished female in no acute distress.  Cardiovascular: normal rate and rhythm Respiratory: normal effort, clear to auscultation bilaterally GI: Abd soft, non-tender, gravid appropriate for gestational age.   No rebound or guarding. MS: Extremities nontender, no edema, normal ROM        Left ankle with tenderness over lateral malleolus, no ecchymosis or acute deformity Neurologic: Alert and oriented x 4.  GU: Neg CVAT.  PELVIC EXAM: deferred   FHT:  Baseline 130 , moderate variability, accelerations present, no decelerations Contractions: q 3-5 mins Irregular     Labs: Results for orders placed or performed during the hospital encounter of 12/22/21 (from the past 24 hour(s))  Urinalysis, Routine w reflex microscopic Urine, Clean Catch     Status: Abnormal   Collection Time: 12/22/21  6:52 PM  Result Value Ref Range   Color, Urine YELLOW YELLOW   APPearance CLOUDY (A) CLEAR   Specific Gravity, Urine 1.011 1.005 -  1.030   pH 6.0 5.0 - 8.0   Glucose, UA 50 (A) NEGATIVE mg/dL   Hgb urine dipstick NEGATIVE NEGATIVE   Bilirubin Urine NEGATIVE NEGATIVE   Ketones, ur NEGATIVE NEGATIVE mg/dL   Protein, ur 02/21/22 (A) NEGATIVE mg/dL   Nitrite NEGATIVE NEGATIVE   Leukocytes,Ua TRACE (A) NEGATIVE   RBC / HPF 0-5 0 - 5 RBC/hpf   WBC, UA 6-10 0 - 5 WBC/hpf   Bacteria, UA MANY (A) NONE SEEN   Squamous Epithelial / LPF 21-50 0 - 5   Mucus PRESENT   CBC     Status: Abnormal   Collection Time: 12/22/21  9:07 PM  Result Value Ref Range   WBC 10.7 (H) 4.0 - 10.5 K/uL   RBC 4.04 3.87 - 5.11 MIL/uL   Hemoglobin 10.2 (L) 12.0 - 15.0 g/dL   HCT 02/21/22 (L) 71.0 - 62.6 %   MCV 81.4 80.0 - 100.0 fL   MCH 25.2 (L) 26.0 - 34.0 pg   MCHC 31.0 30.0 - 36.0 g/dL   RDW 94.8 54.6 - 27.0 %   Platelets 423 (H) 150 - 400 K/uL   nRBC 1.2 (H) 0.0 - 0.2 %  Comprehensive metabolic panel     Status:  Abnormal   Collection Time: 12/22/21  9:07 PM  Result Value Ref Range   Sodium 135 135 - 145 mmol/L   Potassium 3.3 (L) 3.5 - 5.1 mmol/L   Chloride 107 98 - 111 mmol/L   CO2 18 (L) 22 - 32 mmol/L   Glucose, Bld 103 (H) 70 - 99 mg/dL   BUN <5 (L) 6 - 20 mg/dL   Creatinine, Ser 5.99 0.44 - 1.00 mg/dL   Calcium 9.5 8.9 - 35.7 mg/dL   Total Protein 6.0 (L) 6.5 - 8.1 g/dL   Albumin 2.9 (L) 3.5 - 5.0 g/dL   AST 15 15 - 41 U/L   ALT 11 0 - 44 U/L   Alkaline Phosphatase 160 (H) 38 - 126 U/L   Total Bilirubin 0.4 0.3 - 1.2 mg/dL   GFR, Estimated >01 >77 mL/min   Anion gap 10 5 - 15    O/Positive/-- (04/12 1647)  Imaging:  Ankle xray negative  MAU Course/MDM: I have reviewed the triage vital signs and the nursing notes.   Pertinent labs & imaging results that were available during my care of the patient were reviewed by me and considered in my medical decision making (see chart for details).      I have reviewed her medical records including past results, notes and treatments.   I have ordered labs and reviewed results.  Preeclampsia labs are normal  NST reviewed, reactive with irregular contractions Consult Dr Charlotta Newton with presentation, exam findings and test results.  Treatments in MAU included IV, Reglan and Benadryl for headache, xray of left ankle.   Ankle xray is negative Headache improved with Reglan and Benadryl Will admit for observation overnight  Assessment: Single IUP at [redacted]w[redacted]d S/P Fall onto abcomen Cramping, improved Headache, improved Left ankle injury  Plan: Admit for observation Routine orders MD to follow   Wynelle Bourgeois CNM, MSN Certified Nurse-Midwife 12/22/2021 8:15 PM

## 2021-12-23 DIAGNOSIS — R109 Unspecified abdominal pain: Secondary | ICD-10-CM

## 2021-12-23 DIAGNOSIS — O10919 Unspecified pre-existing hypertension complicating pregnancy, unspecified trimester: Secondary | ICD-10-CM

## 2021-12-23 DIAGNOSIS — O26893 Other specified pregnancy related conditions, third trimester: Secondary | ICD-10-CM | POA: Diagnosis not present

## 2021-12-23 DIAGNOSIS — O9A213 Injury, poisoning and certain other consequences of external causes complicating pregnancy, third trimester: Secondary | ICD-10-CM | POA: Diagnosis not present

## 2021-12-23 MED ORDER — ASPIRIN 81 MG PO TBEC
81.0000 mg | DELAYED_RELEASE_TABLET | Freq: Every day | ORAL | Status: DC
  Filled 2021-12-23: qty 1

## 2021-12-23 MED ORDER — FERROUS SULFATE 325 (65 FE) MG PO TABS
325.0000 mg | ORAL_TABLET | Freq: Every morning | ORAL | Status: DC
  Administered 2021-12-23: 325 mg via ORAL
  Filled 2021-12-23: qty 1

## 2021-12-23 MED ORDER — ONDANSETRON HCL 4 MG PO TABS: 4.0000 mg | ORAL_TABLET | Freq: Three times a day (TID) | ORAL | Status: DC | PRN

## 2021-12-23 MED ORDER — FAMOTIDINE 20 MG PO TABS
20.0000 mg | ORAL_TABLET | Freq: Two times a day (BID) | ORAL | Status: DC
  Administered 2021-12-23: 20 mg via ORAL
  Filled 2021-12-23: qty 1

## 2021-12-23 MED ORDER — ENOXAPARIN SODIUM 40 MG/0.4ML IJ SOSY
40.0000 mg | PREFILLED_SYRINGE | Freq: Every day | INTRAMUSCULAR | Status: DC
  Administered 2021-12-23: 40 mg via SUBCUTANEOUS
  Filled 2021-12-23 (×2): qty 0.4

## 2021-12-23 MED ORDER — LABETALOL HCL 100 MG PO TABS: 400.0000 mg | ORAL_TABLET | Freq: Three times a day (TID) | ORAL | Status: DC

## 2021-12-23 MED ORDER — ALBUTEROL SULFATE HFA 108 (90 BASE) MCG/ACT IN AERS: 1.0000 | INHALATION_SPRAY | Freq: Four times a day (QID) | RESPIRATORY_TRACT | Status: DC | PRN

## 2021-12-23 MED ORDER — ALBUTEROL SULFATE (2.5 MG/3ML) 0.083% IN NEBU: 2.5000 mg | INHALATION_SOLUTION | Freq: Four times a day (QID) | RESPIRATORY_TRACT | Status: DC | PRN

## 2021-12-23 MED ORDER — SERTRALINE HCL 100 MG PO TABS
100.0000 mg | ORAL_TABLET | Freq: Every morning | ORAL | Status: DC
  Administered 2021-12-23: 100 mg via ORAL
  Filled 2021-12-23: qty 1

## 2021-12-23 NOTE — MAU Note (Signed)
Dr. Ozan at bedside.  

## 2021-12-23 NOTE — Progress Notes (Signed)
FACULTY PRACTICE ANTEPARTUM(COMPREHENSIVE) NOTE  Kimberly Stark is a 31 y.o. [id number] with Estimated Date of Delivery: 01/18/22   By  LMP [redacted]w[redacted]d  who was placed in observation due to fall on her abdomen in third trimester  Fetal presentation is cephalic. Length of Stay:  0  Days  Date of admission:12/22/2021  Subjective: Resting comfortably this am.  Abdominal pain and headache now resolved.   Notes some discomfort in her ankle, but otherwise no acute complaints this am.  Pt has been able to get up and walk around/go to the bathroom without difficulty. Patient reports the fetal movement as active. Patient reports uterine contraction  activity as none. Patient reports  vaginal bleeding as none. Patient describes fluid per vagina as None.  Vitals:  Blood pressure (!) 133/56, pulse 79, temperature 98.1 F (36.7 C), resp. rate 18, height 5\' 4"  (1.626 m), weight 103 kg, last menstrual period 03/22/2021, SpO2 98 %. Vitals:   12/23/21 0640 12/23/21 0645 12/23/21 0650 12/23/21 0700  BP:  (!) 126/51  (!) 133/56  Pulse:  79  79  Resp:      Temp:      SpO2: 99% 100% 100% 98%  Weight:      Height:       Physical Examination:  General appearance - alert, well appearing, and in no distress Mental status - normal mood, behavior, speech, dress, motor activity, and thought processes Chest - CTAB Heart - normal rate and regular rhythm Abdomen - gravid, soft and non-tender Extremities - no edema, no calf tenderness bilaterally Skin - warm and dry   Fetal Monitoring:  Baseline: 120 bpm, Variability: moderate, Accelerations: +15x15 accels x 2, and Decelerations: Absent    reactive  Labs:  Results for orders placed or performed during the hospital encounter of 12/22/21 (from the past 24 hour(s))  Urinalysis, Routine w reflex microscopic Urine, Clean Catch   Collection Time: 12/22/21  6:52 PM  Result Value Ref Range   Color, Urine YELLOW YELLOW   APPearance CLOUDY (A) CLEAR   Specific Gravity,  Urine 1.011 1.005 - 1.030   pH 6.0 5.0 - 8.0   Glucose, UA 50 (A) NEGATIVE mg/dL   Hgb urine dipstick NEGATIVE NEGATIVE   Bilirubin Urine NEGATIVE NEGATIVE   Ketones, ur NEGATIVE NEGATIVE mg/dL   Protein, ur 540 (A) NEGATIVE mg/dL   Nitrite NEGATIVE NEGATIVE   Leukocytes,Ua TRACE (A) NEGATIVE   RBC / HPF 0-5 0 - 5 RBC/hpf   WBC, UA 6-10 0 - 5 WBC/hpf   Bacteria, UA MANY (A) NONE SEEN   Squamous Epithelial / LPF 21-50 0 - 5   Mucus PRESENT   Protein / creatinine ratio, urine   Collection Time: 12/22/21  6:52 PM  Result Value Ref Range   Creatinine, Urine 97 mg/dL   Total Protein, Urine 48 mg/dL   Protein Creatinine Ratio 0.49 (H) 0.00 - 0.15 mg/mg[Cre]  CBC   Collection Time: 12/22/21  9:07 PM  Result Value Ref Range   WBC 10.7 (H) 4.0 - 10.5 K/uL   RBC 4.04 3.87 - 5.11 MIL/uL   Hemoglobin 10.2 (L) 12.0 - 15.0 g/dL   HCT 98.1 (L) 19.1 - 47.8 %   MCV 81.4 80.0 - 100.0 fL   MCH 25.2 (L) 26.0 - 34.0 pg   MCHC 31.0 30.0 - 36.0 g/dL   RDW 29.5 62.1 - 30.8 %   Platelets 423 (H) 150 - 400 K/uL   nRBC 1.2 (H) 0.0 - 0.2 %  Comprehensive  metabolic panel   Collection Time: 12/22/21  9:07 PM  Result Value Ref Range   Sodium 135 135 - 145 mmol/L   Potassium 3.3 (L) 3.5 - 5.1 mmol/L   Chloride 107 98 - 111 mmol/L   CO2 18 (L) 22 - 32 mmol/L   Glucose, Bld 103 (H) 70 - 99 mg/dL   BUN <5 (L) 6 - 20 mg/dL   Creatinine, Ser 8.31 0.44 - 1.00 mg/dL   Calcium 9.5 8.9 - 51.7 mg/dL   Total Protein 6.0 (L) 6.5 - 8.1 g/dL   Albumin 2.9 (L) 3.5 - 5.0 g/dL   AST 15 15 - 41 U/L   ALT 11 0 - 44 U/L   Alkaline Phosphatase 160 (H) 38 - 126 U/L   Total Bilirubin 0.4 0.3 - 1.2 mg/dL   GFR, Estimated >61 >60 mL/min   Anion gap 10 5 - 15    Imaging Studies:    DG Ankle 2 Views Left  Result Date: 12/22/2021 CLINICAL DATA:  fell, pain lateral malleolus EXAM: LEFT ANKLE - 2 VIEW COMPARISON:  None Available. FINDINGS: There is no evidence of fracture, dislocation, or joint effusion. There is no  evidence of arthropathy or other focal bone abnormality. Soft tissues are unremarkable. IMPRESSION: Negative. Electronically Signed   By: Tish Frederickson M.D.   On: 12/22/2021 20:58   [place] MFM OB FOLLOW UP  Result Date: 12/22/2021 ----------------------------------------------------------------------  OBSTETRICS REPORT                    (Corrected Final 12/22/2021 10:12 am) ---------------------------------------------------------------------- Patient Info  ID #:       [id number]                          D.O.B.:  1991/02/17 (31 yrs)  Name:       Kimberly Stark                   Visit Date: 12/22/2021 08:59 am ---------------------------------------------------------------------- Performed By  Attending:        Lin Landsman      Ref. Address:     [address]                    [place]                                                             [place], [place]                                                             [address]  Performed By:     Tommie Raymond BS,       [facility]                    RD[facility], RVT                                [facility]  Referred By:      [facility] ---------------------------------------------------------------------- Orders  #  Description                           Code        Ordered By  1  [place] MFM OB FOLLOW UP                   [phone]    RAVI SHANKAR  2  [place] MFM FETAL BPP WO NON               [id number]    RAVI SHANKAR     STRESS ----------------------------------------------------------------------  #  Order #                     Accession #                Episode #  1  [id number]                   [id number]                 [id number]  2  [id number]                   [id number]                 [id number] ---------------------------------------------------------------------- Indications  [redacted]  weeks gestation of pregnancy                Z3A.36  Hypertension - Chronic/Pre-existing            O10.019  (labetalol)  Cervical incompetence, third trimester -       O34.33  cerclage X 2  Poor obstetric history: Previous preeclampsia  O09.299  Previous cesarean delivery, antepartum         O34.219  (VBAC since)  Poor obstetric history: Previous preterm       O09.219  delivery, antepartum (30 and 34 weeks)  Medical complication of pregnancy (hx PE -     O26.90  on Lovenox)  Tobacco use complicating pregnancy, third      O99.333  trimester  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 Cord Insertion:      Previously Visualized  Amniotic Fluid  AFI FV:      Within normal limits  AFI Sum(cm)     %Tile  Largest Pocket(cm)  10.6            27          4.1  RUQ(cm)       RLQ(cm)       LUQ(cm)        LLQ(cm)  2.6           4.1           1.3            2.6 ---------------------------------------------------------------------- Biophysical Evaluation  Amniotic F.V:   Pocket => 2 cm             F. Tone:        Observed  F. Movement:    Observed                   Score:          8/8  F. Breathing:   Observed ---------------------------------------------------------------------- Biometry  BPD:      83.5  mm     G. Age:  33w 4d        4.5  %    CI:        72.83   %    70 - 86                                                          FL/HC:      21.8   %    20.1 - 22.1  HC:      311.1  mm     G. Age:  34w 6d        3.9  %    HC/AC:      0.94        0.93 - 1.11  AC:      332.7  mm     G. Age:  37w 1d         85  %    FL/BPD:     81.1   %    71 - 87  FL:       67.7  mm     G. Age:  34w 6d          15  %    FL/AC:      20.3   %    20 - 24  LV:        5.6  mm  Est. FW:    2801  gm      6 lb 3 oz     45  % ---------------------------------------------------------------------- OB History  Gravidity:    11        Term:   0        Prem:   2        SAB:   7  TOP:          1       Ectopic:  0        Living: 2 ---------------------------------------------------------------------- Gestational Age  LMP:           39w 2d        Date:  03/22/21                  EDD:   12/27/21  U/S Today:     35w  1d                                        EDD:   01/25/22  Best:          36w 1d     Det. ByMarcella Dubs         EDD:   01/18/22                                      (06/30/21) ---------------------------------------------------------------------- Anatomy  Cranium:               Appears normal         LVOT:                   Previously seen  Cavum:                 Previously seen        Aortic Arch:            Previously seen  Ventricles:            Appears normal         Ductal Arch:            Previously seen  Choroid Plexus:        Previously seen        Diaphragm:              Appears normal  Cerebellum:            Previously seen        Stomach:                Appears normal, left                                                                        sided  Posterior Fossa:       Previously seen        Abdomen:                Previously seen  Nuchal Fold:           Previously seen        Abdominal Wall:         Previously seen  Face:                  Orbits and profile     Cord Vessels:           Previously seen                         previously seen  Lips:                  Previously seen        Kidneys:                Appear normal  Palate:                Not well visualized    Bladder:  Appears normal  Thoracic:              Appears normal         Spine:                  Limited views                                                                        previously seen  Heart:                 Appears  normal         Upper Extremities:      Previously seen                         (4CH, axis, and                         situs)  RVOT:                  Previously seen        Lower Extremities:      Previously seen  Other:  Heels/feet and open hands/5th digits previously visualized previously.          Female gender previously seen. Technicallly difficult due to advanced          GA, fetal position, and maternal habitus. ---------------------------------------------------------------------- Cervix Uterus Adnexa  Cervix  Not visualized (advanced GA >24wks)  Uterus  No abnormality visualized.  Right Ovary  Within normal limits.  Left Ovary  Within normal limits.  Cul De Sac  No free fluid seen.  Adnexa  No abnormality visualized. ---------------------------------------------------------------------- Impression  Follow up growth due to known chronic hypertension  Normal interval growth with measurements consistent with  dates  Good fetal movement and amniotic fluid volume  Biophysical profile 8/8  BP stable today at 137/73 mmHg.  Continues on Lovenox due to history PE.  She is contemplating BTL for contraception. She was  instructed to message her providers today.  All questions answered. Good fetal movement and amniotic  fluid noted. ---------------------------------------------------------------------- Recommendations  Planned delivery on 10/18 ----------------------------------------------------------------------                    Lin Landsman, MD Electronically Signed Corrected Final Report  12/22/2021 10:12 am ----------------------------------------------------------------------  [place] MFM FETAL BPP WO NON STRESS  Result Date: 12/22/2021 ----------------------------------------------------------------------  OBSTETRICS REPORT                    (Corrected Final 12/22/2021 10:12 am) ---------------------------------------------------------------------- Patient Info  ID #:       [id number]                           D.O.B.:  03-Dec-1990 (31 yrs)  Name:       Kimberly Stark                   Visit Date: 12/22/2021 08:59 am ---------------------------------------------------------------------- Performed By  Attending:        Lin Landsman      Ref. Address:     [address]  [place]                                                             [place], [place]                                                             [id number]  Performed By:     Tommie RaymondMesha Tester BS,       Location:         [facility]                    RDMS, RVT                                [facility] at                                                             [facility]  [facility] By:      [facility] ---------------------------------------------------------------------- Orders  #  Description                           Code        Ordered By  1  US MFM OB FOLLOW UP                   [id number]    RAVI SHANKAR  2  US MFM FETAL BPP WO NON               [id number]    RAVI SHANKAR     STRESS ----------------------------------------------------------------------  #  Order #                     Accession #                Episode #  1  [id number]                   [id number]                 [id number]  2  [id number]                   [id number]                 [id number] ---------------------------------------------------------------------- Indications  [redacted] weeks gestation  of pregnancy                Z3A.36  Hypertension - Chronic/Pre-existing            O10.019  (labetalol)  Cervical incompetence, third trimester -       O34.33  cerclage X 2  Poor obstetric history: Previous preeclampsia  O09.299  Previous cesarean delivery, antepartum         O34.219  (VBAC since)  Poor obstetric history: Previous preterm       O09.219  delivery, antepartum (30 and 34 weeks)  Medical complication of pregnancy (hx PE -     O26.90  on Lovenox)  Tobacco use complicating  pregnancy, third      O99.333  trimester  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 Cord Insertion:      Previously Visualized  Amniotic Fluid  AFI FV:      Within normal limits  AFI Sum(cm)     %Tile       Largest Pocket(cm)  10.6            27          4.1  RUQ(cm)       RLQ(cm)       LUQ(cm)        LLQ(cm)  2.6           4.1           1.3            2.6 ---------------------------------------------------------------------- Biophysical Evaluation  Amniotic F.V:   Pocket => 2 cm             F. Tone:        Observed  F. Movement:    Observed                   Score:          8/8  F. Breathing:   Observed ---------------------------------------------------------------------- Biometry  BPD:      83.5  mm     G. Age:  33w 4d        4.5  %    CI:        72.83   %    70 - 86                                                          FL/HC:      21.8   %    20.1 - 22.1  HC:      311.1  mm     G. Age:  34w 6d        3.9  %    HC/AC:      0.94  0.93 - 1.11  AC:      332.7  mm     G. Age:  37w 1d         85  %    FL/BPD:     81.1   %    71 - 87  FL:       67.7  mm     G. Age:  34w 6d         15  %    FL/AC:      20.3   %    20 - 24  LV:        5.6  mm  Est. FW:    2801  gm      6 lb 3 oz     45  % ---------------------------------------------------------------------- OB History  Gravidity:    11        Term:   0        Prem:   2        SAB:   7  TOP:          1       Ectopic:  0        Living: 2 ---------------------------------------------------------------------- Gestational Age  LMP:           39w 2d        Date:  03/22/21                  EDD:   12/27/21  U/S Today:     35w 1d                                         EDD:   01/25/22  Best:          36w 1d     Det. ByMarcella Dubs         EDD:   01/18/22                                      (06/30/21) ---------------------------------------------------------------------- Anatomy  Cranium:               Appears normal         LVOT:                   Previously seen  Cavum:                 Previously seen        Aortic Arch:            Previously seen  Ventricles:            Appears normal         Ductal Arch:            Previously seen  Choroid Plexus:        Previously seen        Diaphragm:              Appears normal  Cerebellum:            Previously seen        Stomach:                Appears normal, left  sided  Posterior Fossa:       Previously seen        Abdomen:                Previously seen  Nuchal Fold:           Previously seen        Abdominal Wall:         Previously seen  Face:                  Orbits and profile     Cord Vessels:           Previously seen                         previously seen  Lips:                  Previously seen        Kidneys:                Appear normal  Palate:                Not well visualized    Bladder:                Appears normal  Thoracic:              Appears normal         Spine:                  Limited views                                                                        previously seen  Heart:                 Appears normal         Upper Extremities:      Previously seen                         (4CH, axis, and                         situs)  RVOT:                  Previously seen        Lower Extremities:      Previously seen  Other:  Heels/feet and open hands/5th digits previously visualized previously.          Female gender previously seen. Technicallly difficult due to advanced          GA, fetal position, and maternal habitus. ---------------------------------------------------------------------- Cervix  Uterus Adnexa  Cervix  Not visualized (advanced GA >24wks)  Uterus  No abnormality visualized.  Right Ovary  Within normal limits.  Left Ovary  Within normal limits.  Cul De Sac  No free fluid seen.  Adnexa  No abnormality visualized. ---------------------------------------------------------------------- Impression  Follow up growth due to known chronic hypertension  Normal interval growth with measurements consistent with  dates  Good fetal movement and amniotic fluid volume  Biophysical profile 8/8  BP stable today at 137/73 mmHg.  Continues on Lovenox due to history PE.  She is contemplating BTL for contraception. She was  instructed to message her providers today.  All questions answered. Good fetal movement and amniotic  fluid noted. ---------------------------------------------------------------------- Recommendations  Planned delivery on 10/18 ----------------------------------------------------------------------                    Sander Nephew, MD Electronically Signed Corrected Final Report  12/22/2021 10:12 am ----------------------------------------------------------------------    Medications:  Scheduled  aspirin EC  81 mg Oral Daily   docusate sodium  100 mg Oral Daily   enoxaparin  40 mg Subcutaneous Daily   famotidine  20 mg Oral BID   ferrous sulfate  325 mg Oral q AM   labetalol  400 mg Oral TID   prenatal multivitamin  1 tablet Oral Q1200   sertraline  100 mg Oral q AM   I have reviewed the patient's current medications.  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   Anticoagulated 11/21/2021   Migraine with typical aura 09/19/2021   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/2021   Insomnia 09/19/2021   Glaucoma 09/19/2021   Chronic post-traumatic stress disorder 09/19/2021   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 I reassuring, Reactive NST -no evidence of labor -Chronic HTN- continue home medication, reviewed preeclampsia precautions -follow up as scheduled for outpatient appts and IOL as scheduled -questions/concerns were addressed  DISP: plan to discharge home this am  Janyth Pupa, DO Attending [place], [place] for [employer], [place]

## 2021-12-26 ENCOUNTER — Ambulatory Visit
Admission: RE | Admit: 2021-12-26 | Discharge: 2021-12-26 | Disposition: A | Discharge status: Home / Self Care | Payer: Medicaid Other | Source: Ambulatory Visit | Attending: Neurology | Admitting: Neurology

## 2021-12-26 ENCOUNTER — Encounter (HOSPITAL_COMMUNITY)
Admission: RE | Admit: 2021-12-26 | Discharge: 2021-12-26 | Disposition: A | Discharge status: Home / Self Care | Payer: No Typology Code available for payment source | Source: Ambulatory Visit | Attending: Family Medicine | Admitting: Family Medicine

## 2021-12-26 ENCOUNTER — Other Ambulatory Visit: Payer: Self-pay

## 2021-12-26 ENCOUNTER — Other Ambulatory Visit (HOSPITAL_COMMUNITY)
Admission: RE | Admit: 2021-12-26 | Discharge: 2021-12-26 | Disposition: A | Discharge status: Home / Self Care | Payer: No Typology Code available for payment source | Source: Ambulatory Visit | Attending: Family Medicine | Admitting: Family Medicine

## 2021-12-26 ENCOUNTER — Ambulatory Visit (INDEPENDENT_AMBULATORY_CARE_PROVIDER_SITE_OTHER): Payer: Medicaid Other | Admitting: Obstetrics and Gynecology

## 2021-12-26 VITALS — BP 132/67 | HR 89 | Wt 225.3 lb

## 2021-12-26 DIAGNOSIS — O09893 Supervision of other high risk pregnancies, third trimester: Secondary | ICD-10-CM

## 2021-12-26 DIAGNOSIS — Z141 Cystic fibrosis carrier: Secondary | ICD-10-CM

## 2021-12-26 DIAGNOSIS — O99213 Obesity complicating pregnancy, third trimester: Secondary | ICD-10-CM

## 2021-12-26 DIAGNOSIS — O0990 Supervision of high risk pregnancy, unspecified, unspecified trimester: Secondary | ICD-10-CM

## 2021-12-26 DIAGNOSIS — Q0700 Arnold-Chiari syndrome without spina bifida or hydrocephalus: Secondary | ICD-10-CM

## 2021-12-26 DIAGNOSIS — Z86711 Personal history of pulmonary embolism: Secondary | ICD-10-CM

## 2021-12-26 DIAGNOSIS — Z98891 History of uterine scar from previous surgery: Secondary | ICD-10-CM

## 2021-12-26 DIAGNOSIS — Z6838 Body mass index (BMI) 38.0-38.9, adult: Secondary | ICD-10-CM

## 2021-12-26 DIAGNOSIS — O09293 Supervision of pregnancy with other poor reproductive or obstetric history, third trimester: Secondary | ICD-10-CM

## 2021-12-26 HISTORY — DX: Cardiac murmur, unspecified: R01.1

## 2021-12-26 HISTORY — DX: Anemia, unspecified: D64.9

## 2021-12-26 HISTORY — DX: Encounter for other specified aftercare: Z51.89

## 2021-12-26 HISTORY — DX: Personal history of other complications of pregnancy, childbirth and the puerperium: Z87.59

## 2021-12-26 LAB — CBC
HCT: 32 % — ABNORMAL LOW (ref 36.0–46.0)
Hemoglobin: 10.2 g/dL — ABNORMAL LOW (ref 12.0–15.0)
MCH: 25.4 pg — ABNORMAL LOW (ref 26.0–34.0)
MCHC: 31.9 g/dL (ref 30.0–36.0)
MCV: 79.6 fL — ABNORMAL LOW (ref 80.0–100.0)
Platelets: 445 10*3/uL — ABNORMAL HIGH (ref 150–400)
RBC: 4.02 MIL/uL (ref 3.87–5.11)
RDW: 15.3 % (ref 11.5–15.5)
WBC: 10.2 10*3/uL (ref 4.0–10.5)
nRBC: 1.1 % — ABNORMAL HIGH (ref 0.0–0.2)

## 2021-12-26 LAB — RPR: RPR Ser Ql: NONREACTIVE

## 2021-12-26 NOTE — Discharge Summary (Signed)
Physician Discharge Summary  Kimberly PotterKeiona Shen ZOX:[id number] DOB: 04/23/1990 DOA: 12/22/2021  PCP: Default, Provider, MD  Admit date: 12/22/2021 Discharge date: 12/23/2021  Admit Diagnosis: 1) IUP@ 3453w2d 2) Chronic HTN with elevated BP 3) Syncopal event/fall  Discharge Diagnosis: same  Recommendations for Outpatient Follow-up:  Follow up withOB as scheduled  Discharge Condition:stable   Brief/Interim Summary:  Kimberly PotterKeiona Litle is a 31 y.o. [id number] at 6336w1dwho presents to maternity admissions reporting having fallen today.  Was walking across the street and became dizzy, saw colors and fainted, landing on her abdomen. She given IV fluids, placed on continuous monitoring.  Headache resolved with reglan and benadryl.  She was discharged home in stable condition, next day  Discharge Diagnoses:  Principal Problem:   Fall    Discharge Instructions Routine OB care Follow up as scheduled later this week  Allergies as of 12/23/2021       Reactions   Iodinated Contrast Media Shortness Of Breath, Rash, Swelling   Latex Anaphylaxis   Shellfish Allergy Anaphylaxis   Iodine Hives   Penicillins Hives        Medication List     TAKE these medications    acetaminophen 500 MG tablet Commonly known as: TYLENOL Take 1,000 mg by mouth every 6 (six) hours as needed (headaches/pain.).   albuterol 108 (90 Base) MCG/ACT inhaler Commonly known as: VENTOLIN HFA Inhale 1-2 puffs into the lungs every 6 (six) hours as needed for wheezing or shortness of breath.   aspirin EC 81 MG tablet Take 1 tablet (81 mg total) by mouth daily. Swallow whole.   calcium carbonate 500 MG chewable tablet Commonly known as: TUMS - dosed in mg elemental calcium Chew 2-3 tablets by mouth 3 (three) times daily as needed for indigestion or heartburn.   enoxaparin 40 MG/0.4ML injection Commonly known as: LOVENOX Inject 0.4 mLs (40 mg total) into the skin daily. What changed: when to take this   famotidine 20  MG tablet Commonly known as: PEPCID Take 1 tablet (20 mg total) by mouth 2 (two) times daily. What changed: when to take this   ferrous sulfate 325 (65 FE) MG EC tablet Take 325 mg by mouth in the morning.   labetalol 200 MG tablet Commonly known as: NORMODYNE Take 2 tablets (400 mg total) by mouth 3 (three) times daily.   ondansetron 4 MG tablet Commonly known as: Zofran Take 1 tablet (4 mg total) by mouth every 8 (eight) hours as needed for nausea or vomiting.   PRENATAL GUMMIES PO Take 2 tablets by mouth in the morning.   sertraline 100 MG tablet Commonly known as: ZOLOFT Take 100 mg by mouth in the morning.   VITAMIN D3 PO Take 1,000 Units by mouth in the morning.        Follow-up Information     [facility].   Specialty: Obstetrics and Gynecology Why: Follow up next week as scheduled Contact information: [address] [phone]               Allergies  Allergen Reactions   Iodinated Contrast Media Shortness Of Breath, Rash and Swelling   Latex Anaphylaxis   Shellfish Allergy Anaphylaxis   Iodine Hives   Penicillins Hives    Consultations: none   Procedures/Studies: DG Ankle 2 Views Left  Result Date: 12/22/2021 CLINICAL DATA:  fell, pain lateral malleolus EXAM: LEFT ANKLE - 2 VIEW COMPARISON:  None Available. FINDINGS:  There is no evidence of fracture, dislocation, or joint effusion. There is no evidence of arthropathy or other focal bone abnormality. Soft tissues are unremarkable. IMPRESSION: Negative. Electronically Signed   By: Tish Frederickson M.D.   On: 12/22/2021 20:58   [place] MFM OB FOLLOW UP  Result Date: 12/22/2021 ----------------------------------------------------------------------  OBSTETRICS REPORT                    (Corrected Final 12/22/2021 10:12 am) ---------------------------------------------------------------------- Patient  Info  ID #:       [id number]                          D.O.B.:  [birth year] (31 yrs)  Name:       Kimberly Stark                   Visit Date: 12/22/2021 08:59 am ---------------------------------------------------------------------- Performed By  Attending:        Lin Landsman      Ref. Address:     [address]                    [place]                                                             [place], [place]                                                             [address]  Performed By:     Tommie Raymond BS,       [facility]         [facility]                                [facility]  Referred By:      [facility] ---------------------------------------------------------------------- Orders  #  Description                           Code  Ordered By  1  [place] MFM OB FOLLOW UP                   [id number]    RAVI SHANKAR  2  [place] MFM FETAL BPP WO NON               76819.01    RAVI [facility]     STRESS ----------------------------------------------------------------------  #  Order #                     Accession #                Episode #  1  [id number]                   [id number]                 [id number]  2  [id number]                   [id number]                 [id number] ---------------------------------------------------------------------- Indications  [redacted] weeks gestation of pregnancy                Z3A.36  Hypertension - Chronic/Pre-existing            O10.019  (labetalol)  Cervical incompetence, third trimester -       O34.33  cerclage X 2  Poor obstetric history: Previous preeclampsia  O09.299  Previous cesarean delivery, antepartum         O34.219  (VBAC since)  Poor obstetric history: Previous preterm       O09.219  delivery, antepartum (30 and 34 weeks)  Medical complication of pregnancy (hx PE -      O26.90  on Lovenox)  Tobacco use complicating pregnancy, third      O99.333  trimester  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 Cord Insertion:      Previously Visualized  Amniotic Fluid  AFI FV:      Within normal limits  AFI Sum(cm)     %Tile       Largest Pocket(cm)  10.6            27          4.1  RUQ(cm)       RLQ(cm)       LUQ(cm)        LLQ(cm)  2.6           4.1           1.3            2.6 ---------------------------------------------------------------------- Biophysical Evaluation  Amniotic F.V:   Pocket => 2 cm             F. Tone:        Observed  F. Movement:    Observed  Score:          8/8  F. Breathing:   Observed ---------------------------------------------------------------------- Biometry  BPD:      83.5  mm     G. Age:  33w 4d        4.5  %    CI:        72.83   %    70 - 86                                                          FL/HC:      21.8   %    20.1 - 22.1  HC:      311.1  mm     G. Age:  34w 6d        3.9  %    HC/AC:      0.94        0.93 - 1.11  AC:      332.7  mm     G. Age:  37w 1d         85  %    FL/BPD:     81.1   %    71 - 87  FL:       67.7  mm     G. Age:  34w 6d         15  %    FL/AC:      20.3   %    20 - 24  LV:        5.6  mm  Est. FW:    2801  gm      6 lb 3 oz     45  % ---------------------------------------------------------------------- OB History  Gravidity:    11        Term:   0        Prem:   2        SAB:   7  TOP:          1       Ectopic:  0        Living: 2 ---------------------------------------------------------------------- Gestational Age  LMP:           39w 2d        Date:  03/22/21                   EDD:   12/27/21  U/S Today:     35w 1d                                        EDD:   01/25/22  Best:          36w 1d     Det. ByMarcella Dubs         EDD:   01/18/22                                      (06/30/21) ---------------------------------------------------------------------- Anatomy  Cranium:               Appears normal         LVOT:  Previously seen  Cavum:                 Previously seen        Aortic Arch:            Previously seen  Ventricles:            Appears normal         Ductal Arch:            Previously seen  Choroid Plexus:        Previously seen        Diaphragm:              Appears normal  Cerebellum:            Previously seen        Stomach:                Appears normal, left                                                                        sided  Posterior Fossa:       Previously seen        Abdomen:                Previously seen  Nuchal Fold:           Previously seen        Abdominal Wall:         Previously seen  Face:                  Orbits and profile     Cord Vessels:           Previously seen                         previously seen  Lips:                  Previously seen        Kidneys:                Appear normal  Palate:                Not well visualized    Bladder:                Appears normal  Thoracic:              Appears normal         Spine:                  Limited views                                                                        previously seen  Heart:                 Appears normal         Upper  Extremities:      Previously seen                         (4CH, axis, and                         situs)  RVOT:                  Previously seen        Lower Extremities:      Previously seen  Other:  Heels/feet and open hands/5th digits previously visualized previously.          Female gender previously seen. Technicallly difficult due to advanced          GA, fetal position, and maternal habitus.  ---------------------------------------------------------------------- Cervix Uterus Adnexa  Cervix  Not visualized (advanced GA >24wks)  Uterus  No abnormality visualized.  Right Ovary  Within normal limits.  Left Ovary  Within normal limits.  Cul De Sac  No free fluid seen.  Adnexa  No abnormality visualized. ---------------------------------------------------------------------- Impression  Follow up growth due to known chronic hypertension  Normal interval growth with measurements consistent with  dates  Good fetal movement and amniotic fluid volume  Biophysical profile 8/8  BP stable today at 137/73 mmHg.  Continues on Lovenox due to history PE.  She is contemplating BTL for contraception. She was  instructed to message her providers today.  All questions answered. Good fetal movement and amniotic  fluid noted. ---------------------------------------------------------------------- Recommendations  Planned delivery on 10/18 ----------------------------------------------------------------------                    Lin Landsman, MD Electronically Signed Corrected Final Report  12/22/2021 10:12 am ----------------------------------------------------------------------  [place] MFM FETAL BPP WO NON STRESS  Result Date: 12/22/2021 ----------------------------------------------------------------------  OBSTETRICS REPORT                    (Corrected Final 12/22/2021 10:12 am) ---------------------------------------------------------------------- Patient Info  ID #:       [id number]                          D.O.B.:  Jul 10, 1990 (31 yrs)  Name:       Kimberly Stark                   Visit Date: 12/22/2021 08:59 am ---------------------------------------------------------------------- Performed By  Attending:        Lin Landsman      Ref. Address:     [address]                    [place]                                                             [place], [place]                                                              [address]  Performed By:  Mesha Tester BS,       Location[facility]         [facility] RVT                                [facility]  Referred By:      [facility] ---------------------------------------------------------------------- Orders  #  Description                           Code        Ordered By  1  [place] MFM OB FOLLOW UP                   [id number]    RAVI SHANKAR  2  [place] MFM FETAL BPP WO NON               [id number]    RAVI SHANKAR     STRESS ----------------------------------------------------------------------  #  Order #                     Accession #                Episode #  1  [id number]                   [id number]                 [id number]  2  [id number]                   [id number]                 [id number] ---------------------------------------------------------------------- Indications  [redacted] weeks gestation of pregnancy                Z3A.36  Hypertension - Chronic/Pre-existing            O10.019  (labetalol)  Cervical incompetence, third trimester -       O34.33  cerclage X 2  Poor obstetric history: Previous preeclampsia  O09.299  Previous cesarean delivery, antepartum         O34.219  (VBAC since)  Poor obstetric history: Previous preterm       O09.219  delivery, antepartum (30 and 34 weeks)  Medical complication of pregnancy (hx PE -     O26.90  on Lovenox)  Tobacco use complicating pregnancy, third      V56.433  trimester  Genetic carrier (  Sickle Cell Trait)            Z14.8  LR NIPS  Encounter for other antenatal screening        Z36.2  follow-up ---------------------------------------------------------------------- Fetal Evaluation  Num Of Fetuses:         1  Fetal Heart Rate(bpm):  152  Cardiac Activity:       Observed  Presentation:           Cephalic  Placenta:               Anterior Fundal  P.  Cord Insertion:      Previously Visualized  Amniotic Fluid  AFI FV:      Within normal limits  AFI Sum(cm)     %Tile       Largest Pocket(cm)  10.6            27          4.1  RUQ(cm)       RLQ(cm)       LUQ(cm)        LLQ(cm)  2.6           4.1           1.3            2.6 ---------------------------------------------------------------------- Biophysical Evaluation  Amniotic F.V:   Pocket => 2 cm             F. Tone:        Observed  F. Movement:    Observed                   Score:          8/8  F. Breathing:   Observed ---------------------------------------------------------------------- Biometry  BPD:      83.5  mm     G. Age:  33w 4d        4.5  %    CI:        72.83   %    70 - 86                                                          FL/HC:      21.8   %    20.1 - 22.1  HC:      311.1  mm     G. Age:  34w 6d        3.9  %    HC/AC:      0.94        0.93 - 1.11  AC:      332.7  mm     G. Age:  37w 1d         85  %    FL/BPD:     81.1   %    71 - 87  FL:       67.7  mm     G. Age:  34w 6d         15  %    FL/AC:      20.3   %    20 - 24  LV:        5.6  mm  Est. FW:    2801  gm      6 lb 3  oz     45  % ---------------------------------------------------------------------- OB History  Gravidity:    11        Term:   0        Prem:   2        SAB:   7  TOP:          1       Ectopic:  0        Living: 2 ---------------------------------------------------------------------- Gestational Age  LMP:           39w 2d        Date:  03/22/21                  EDD:   12/27/21  U/S Today:     35w 1d                                        EDD:   01/25/22  Best:          36w 1d     Det. ByMarcella Dubs         EDD:   01/18/22                                      (06/30/21) ---------------------------------------------------------------------- Anatomy  Cranium:               Appears normal         LVOT:                   Previously seen  Cavum:                 Previously seen        Aortic Arch:            Previously  seen  Ventricles:            Appears normal         Ductal Arch:            Previously seen  Choroid Plexus:        Previously seen        Diaphragm:              Appears normal  Cerebellum:            Previously seen        Stomach:                Appears normal, left                                                                        sided  Posterior Fossa:       Previously seen        Abdomen:                Previously seen  Nuchal Fold:           Previously seen        Abdominal Wall:         Previously seen  Face:                  Orbits and profile     Cord Vessels:           Previously seen                         previously seen  Lips:                  Previously seen        Kidneys:                Appear normal  Palate:                Not well visualized    Bladder:                Appears normal  Thoracic:              Appears normal         Spine:                  Limited views                                                                        previously seen  Heart:                 Appears normal         Upper Extremities:      Previously seen                         (4CH, axis, and                         situs)  RVOT:                  Previously seen        Lower Extremities:      Previously seen  Other:  Heels/feet and open hands/5th digits previously visualized previously.          Female gender previously seen. Technicallly difficult due to advanced          GA, fetal position, and maternal habitus. ---------------------------------------------------------------------- Cervix Uterus Adnexa  Cervix  Not visualized (advanced GA >24wks)  Uterus  No abnormality visualized.  Right Ovary  Within normal limits.  Left Ovary  Within normal limits.  Cul De Sac  No free fluid seen.  Adnexa  No abnormality visualized. ---------------------------------------------------------------------- Impression  Follow up growth due to known chronic hypertension  Normal interval growth with measurements consistent  with  dates  Good fetal movement and amniotic fluid volume  Biophysical profile 8/8  BP stable today at 137/73 mmHg.  Continues on Lovenox due to history PE.  She is contemplating BTL for contraception. She was  instructed to message her providers today.  All questions answered. Good fetal movement and amniotic  fluid noted. ---------------------------------------------------------------------- Recommendations  Planned delivery on 10/18 ----------------------------------------------------------------------                    Lin Landsman, MD Electronically Signed Corrected Final Report  12/22/2021 10:12 am ----------------------------------------------------------------------  [place] MFM FETAL BPP WO NON STRESS  Result Date: 12/15/2021 ----------------------------------------------------------------------  OBSTETRICS REPORT                       (Signed Final 12/15/2021 03:06 pm) ---------------------------------------------------------------------- Patient Info  ID #:       [id number]                          D.O.B.:  1990-10-12 (31 yrs)  Name:       Kimberly Stark                   Visit Date: 12/15/2021 02:28 pm ---------------------------------------------------------------------- Performed By  Attending:        Ma Rings MD         Ref. Address:     [address]                                                             [place], [place]                                                             [address]  Performed By:     Joanna Hews         Location[facility]                                     [facility]  Referred By:      [facility] ---------------------------------------------------------------------- Orders  #  Description                           Code        Ordered By  1  [place] MFM FETAL  BPP WO NON               19147.82    RAVI SHANKAR     STRESS ----------------------------------------------------------------------  #  Order #                     Accession #                Episode #  1  [id number]                   [id number]                 [id number] ---------------------------------------------------------------------- Indications  Hypertension - Chronic/Pre-existing            O10.019  (labetalol)  Cervical incompetence, third trimester -       O34.33  cerclage X 2  [redacted] weeks gestation of pregnancy                Z3A.35  Poor obstetric history: Previous preeclampsia  O09.299  Previous cesarean delivery, antepartum         O34.219  (VBAC since)  Poor obstetric history: Previous preterm       O09.219  delivery, antepartum (30 and 34 weeks)  Medical complication of pregnancy (hx PE -     O26.90  on Lovenox)  LR NIPS  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 Cord Insertion:      Previously Visualized  Amniotic Fluid  AFI FV:      Within normal limits  AFI Sum(cm)     %Tile       Largest Pocket(cm)  9.4             16          4.9  RUQ(cm)       RLQ(cm)       LUQ(cm)        LLQ(cm)  4.5           0             0              4.9 ---------------------------------------------------------------------- Biophysical Evaluation  Amniotic F.V:   Within normal limits       F. Tone:        Observed  F. Movement:    Observed                   N.S.T:          Reactive  F. Breathing:   Not Observed               Score:          8/10  ---------------------------------------------------------------------- Biometry  LV:        4.4  mm ---------------------------------------------------------------------- OB History  Gravidity:    11        Term:   0        Prem:   2        SAB:   7  TOP:  1       Ectopic:  0        Living: 2 ---------------------------------------------------------------------- Gestational Age  LMP:           38w 2d        Date:  03/22/21                  EDD:   12/27/21  Best:          35w 1d     Det. ByMarcella Dubs         EDD:   01/18/22                                      (06/30/21) ---------------------------------------------------------------------- Anatomy  Cranium:               Appears normal         Diaphragm:              Appears normal  Cavum:                 Appears normal         Stomach:                Appears normal, left                                                                        sided  Ventricles:            Appears normal         Kidneys:                Appear normal  Heart:                 Appears normal         Bladder:                Appears normal                         (4CH, axis, and                         situs) ---------------------------------------------------------------------- Comments  This patient was seen for a BPP due to chronic hypertension  treated with labetalol.  She also has a history of a pulmonary  embolus and is treated with prophylactic Lovenox.  Her blood pressures today were 142/72 and 134/74.  A biophysical profile performed today was 8/10 with a  reactive NST.  She received a -2 for fetal breathing  movements that did not meet criteria.  The AFI was 9.4 cm (within normal limits).  Preeclampsia precautions were reviewed today.  She will return in 1 week for another BPP and growth scan.  She already has a delivery scheduled on December 28, 2021. ----------------------------------------------------------------------                   Ma Rings, MD  Electronically Signed Final Report   12/15/2021 03:06 pm ----------------------------------------------------------------------  [place] MFM FETAL BPP WO NON STRESS  Result Date: 12/01/2021 ----------------------------------------------------------------------  OBSTETRICS  REPORT                       (Signed Final 12/01/2021 02:36 pm) ---------------------------------------------------------------------- Patient Info  ID #:       [id number]                          D.O.B.:  October 15, 1990 (31 yrs)  Name:       Kimberly Stark                   Visit Date: 12/01/2021 01:48 pm ---------------------------------------------------------------------- Performed By  Attending:        Braxton Feathers DO       Ref. Address:     [address]                                                             [place], [place]                                                             [address]  Performed By:     Burt Knack RDMS     Location[facility]  Referred By:      [facility] ---------------------------------------------------------------------- Orders  #  Description                           Code        Ordered By  1  [place] MFM FETAL BPP WO NON               19147.82    RAVI SHANKAR     STRESS ----------------------------------------------------------------------  #  Order #  Accession #                Episode #  1  [id number]                   [id number]                 [id number] ---------------------------------------------------------------------- Indications  [redacted] weeks gestation of pregnancy                Z3A.33  Hypertension - Chronic/Pre-existing            O10.019  (labetalol)  Cervical incompetence, third trimester -       O34.33  cerclage X 2  Poor obstetric  history: Previous preeclampsia  O09.299  Previous cesarean delivery, antepartum         O34.219  (VBAC since)  Poor obstetric history: Previous preterm       O09.219  delivery, antepartum (30 and 34 weeks)  Medical complication of pregnancy (hx PE -     O26.90  on Lovenox)  LR NIPS  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 Cord Insertion:      Previously Visualized  Amniotic Fluid  AFI FV:      Within normal limits  AFI Sum(cm)     %Tile       Largest Pocket(cm)  12.62           38          4.11  RUQ(cm)       RLQ(cm)       LUQ(cm)        LLQ(cm)  1.55          3.11          3.85           4.11 ---------------------------------------------------------------------- Biophysical Evaluation  Amniotic F.V:   Within normal limits       F. Tone:        Observed  F. Movement:    Observed                   Score:          8/8  F. Breathing:   Observed ---------------------------------------------------------------------- OB History  Gravidity:    11        Term:   0        Prem:   2        SAB:   7  TOP:          1       Ectopic:  0        Living: 2 ----------------------------------------------------------------------  Gestational Age  LMP:           36w 2d        Date:  03/22/21                  EDD:   12/27/21  Best:          33w 1d     Det. By:  Marcella Dubs         EDD:   01/18/22                                      (06/30/21)  ---------------------------------------------------------------------- Anatomy  Stomach:               Appears normal, left   Bladder:                Appears normal                         sided  Kidneys:               Appear normal ---------------------------------------------------------------------- Comments  The patient is here for a BPP for CHTN on meds. She is at  33w 1d with EDD of 01/18/2022 dated by Early Ultrasound  (06/30/21).  The patient reports she was diagnosed with  preeclampsia due to protein in her urine but it looks like that  sample was contaminated. Her BP meds have been  increased. She occasionally has a headache but nothing  severe today.  Sonographic findings  Single intrauterine pregnancy.  Observed fetal cardiac activity.  Cephalic presentation.  Interval fetal anatomy appears normal.  Amniotic fluid volume: Within normal limits. AFI: 12.62 cm.  MVP: 4.11 cm.  Placenta is Anterior.  BPP is 8/8.  Recommendations  1. BBPs weekly until delivery  2. Growth ultrasounds every 4 weeks until delivery  3. Delivery around 37 weeks  4. Weekly preeclampsia labs  5. Preeclampsia precautions given today ----------------------------------------------------------------------                  Braxton Feathers, DO Electronically Signed Final Report   12/01/2021 02:36 pm ----------------------------------------------------------------------     Subjective:   Discharge Exam: Vitals:   12/23/21 0705 12/23/21 0721  BP:  (!) 163/80  Pulse:  79  Resp:    Temp:    SpO2: 99%    Vitals:   12/23/21 0655 12/23/21 0700 12/23/21 0705 12/23/21 0721  BP:  (!) 133/56  (!) 163/80  Pulse:  79  79  Resp:      Temp:      SpO2: 99% 98% 99%   Weight:      Height:        O: BP (!) 163/80   Pulse 79   Temp 98.1 F (36.7 C)   Resp 18   Ht 5\' 4"  (1.626 m)   Wt 103 kg   LMP 03/22/2021   SpO2 99%   BMI 38.96 kg/m   General: Pt is alert, awake, not in acute distress Cardiovascular: RRR, S1/S2 +, no  rubs, no gallops Respiratory: CTA bilaterally, no wheezing, no rhonchi Abdominal: Soft, NT, ND, bowel sounds + Extremities: no edema, no cyanosis    The results of significant diagnostics from this hospitalization (including imaging, microbiology, ancillary and laboratory) are listed below for reference.     Microbiology: No results found for this or any previous visit (  from the past 240 hour(s)).   Labs: BNP (last 3 results) No results for input(s): "BNP" in the last 8760 hours. Basic Metabolic Panel: Recent Labs  Lab 12/22/21 2107  NA 135  K 3.3*  CL 107  CO2 18*  GLUCOSE 103*  BUN <5*  CREATININE 0.52  CALCIUM 9.5   Liver Function Tests: Recent Labs  Lab 12/22/21 2107  AST 15  ALT 11  ALKPHOS 160*  BILITOT 0.4  PROT 6.0*  ALBUMIN 2.9*   No results for input(s): "LIPASE", "AMYLASE" in the last 168 hours. No results for input(s): "AMMONIA" in the last 168 hours. CBC: Recent Labs  Lab 12/22/21 2107 12/26/21 0929  WBC 10.7* 10.2  HGB 10.2* 10.2*  HCT 32.9* 32.0*  MCV 81.4 79.6*  PLT 423* 445*   Cardiac Enzymes: No results for input(s): "CKTOTAL", "CKMB", "CKMBINDEX", "TROPONINI" in the last 168 hours. BNP: Invalid input(s): "POCBNP" CBG: No results for input(s): "GLUCAP" in the last 168 hours. D-Dimer No results for input(s): "DDIMER" in the last 72 hours. Hgb A1c No results for input(s): "HGBA1C" in the last 72 hours. Lipid Profile No results for input(s): "CHOL", "HDL", "LDLCALC", "TRIG", "CHOLHDL", "LDLDIRECT" in the last 72 hours. Thyroid function studies No results for input(s): "TSH", "T4TOTAL", "T3FREE", "THYROIDAB" in the last 72 hours.  Invalid input(s): "FREET3" Anemia work up No results for input(s): "VITAMINB12", "FOLATE", "FERRITIN", "TIBC", "IRON", "RETICCTPCT" in the last 72 hours. Urinalysis    Component Value Date/Time   COLORURINE YELLOW 12/22/2021 1852   APPEARANCEUR CLOUDY (A) 12/22/2021 1852   LABSPEC 1.011 12/22/2021  1852   PHURINE 6.0 12/22/2021 1852   GLUCOSEU 50 (A) 12/22/2021 1852   HGBUR NEGATIVE 12/22/2021 1852   BILIRUBINUR NEGATIVE 12/22/2021 1852   KETONESUR NEGATIVE 12/22/2021 1852   PROTEINUR 100 (A) 12/22/2021 1852   UROBILINOGEN 0.2 11/07/2021 0849   NITRITE NEGATIVE 12/22/2021 1852   LEUKOCYTESUR TRACE (A) 12/22/2021 1852   Sepsis Labs Recent Labs  Lab 12/22/21 2107 12/26/21 0929  WBC 10.7* 10.2   Microbiology No results found for this or any previous visit (from the past 240 hour(s)).   Time coordinating discharge: Less than 30 minutes  SIGNED:   Myna Hidalgo, DO Attending Obstetrician & Gynecologist, Faculty Practice [facility], [facility]

## 2021-12-26 NOTE — Progress Notes (Signed)
   PRENATAL VISIT NOTE  Subjective:  Kimberly Stark is a 31 y.o. [id number] at [redacted]w[redacted]d being seen today for ongoing prenatal care.  She is currently monitored for the following issues for this high-risk pregnancy and has History of HELLP syndrome, currently pregnant, third trimester; Supervision of high risk pregnancy, antepartum; History of cervical cerclage; History of pulmonary embolus (PE); Tobacco abuse; History of C-section; Cystic fibrosis carrier; Chronic hypertension; Migraine with typical aura; Mild dysplasia of cervix (CIN I); Insomnia; Glaucoma; Chronic post-traumatic stress disorder; History of LEEP (loop electrosurgical excision procedure) of cervix complicating pregnancy in third trimester; BMI 47.8-29.5,AOZHY; Obesity complicating pregnancy in third trimester; History of preterm delivery, currently pregnant in third trimester; Chiari malformation type I ([place]); Anticoagulated; Arnold-Chiari malformation ([place]); CPD (cephalo-pelvic disproportion); Habitual aborter, currently pregnant; Iron deficiency anemia; and Fall on their problem list.  Patient doing well with no acute concerns today. She reports no complaints.  Contractions: Irritability. Vag. Bleeding: None.  Movement: Present. Denies leaking of fluid.   The following portions of the patient's history were reviewed and updated as appropriate: allergies, current medications, past family history, past medical history, past social history, past surgical history and problem list. Problem list updated.  Objective:   Vitals:   12/26/21 1112  BP: 132/67  Pulse: 89  Weight: 225 lb 4.8 oz (102.2 kg)    Fetal Status: Fetal Heart Rate (bpm): 130 Fundal Height: 38 cm Movement: Present     General:  Alert, oriented and cooperative. Patient is in no acute distress.  Skin: Skin is warm and dry. No rash noted.   Cardiovascular: Normal heart rate noted  Respiratory: Normal respiratory effort, no problems with respiration noted  Abdomen: Soft,  gravid, appropriate for gestational age.  Pain/Pressure: Present     Pelvic: Cervical exam deferred        Extremities: Normal range of motion.  Edema: Trace  Mental Status:  Normal mood and affect. Normal behavior. Normal judgment and thought content.   Assessment and Plan:  Pregnancy: [id number] at [redacted]w[redacted]d  1. Supervision of high risk pregnancy, antepartum Pt scheduled for repeat c/s on 12/28/21 Pt is still considering possible BTL, will inform provider of the patient's wishes  - Cervicovaginal ancillary only( [place]) - Culture, beta strep (group b only)  2. Cystic fibrosis carrier   3. History of HELLP syndrome, currently pregnant, third trimester Normal BP today, no s/sx of preeclampsia  4. History of pulmonary embolus (PE) Pt advised to continue baby ASA, hold evening lovenox on 12/27/21, will restart 18-24 hours after surgery  5. History of C-section Repeat c/s scheduled  6. BMI 38.0-38.9,adult   7. Obesity affecting pregnancy in third trimester, unspecified obesity type   8. History of preterm delivery, currently pregnant in third trimester No s/sx of labor  Term labor symptoms and general obstetric precautions including but not limited to vaginal bleeding, contractions, leaking of fluid and fetal movement were reviewed in detail with the patient.  Please refer to After Visit Summary for other counseling recommendations.   No follow-ups on file.   Lynnda Shields, MD Faculty Attending Center for [facility]

## 2021-12-27 ENCOUNTER — Inpatient Hospital Stay (HOSPITAL_COMMUNITY): Payer: No Typology Code available for payment source | Admitting: Anesthesiology

## 2021-12-27 ENCOUNTER — Telehealth: Payer: Self-pay | Admitting: Neurology

## 2021-12-27 ENCOUNTER — Encounter (HOSPITAL_COMMUNITY): Payer: Self-pay | Admitting: Obstetrics and Gynecology

## 2021-12-27 LAB — CERVICOVAGINAL ANCILLARY ONLY
Chlamydia: NEGATIVE
Comment: NEGATIVE
Comment: NORMAL
Neisseria Gonorrhea: NEGATIVE

## 2021-12-27 NOTE — Anesthesia Preprocedure Evaluation (Signed)
Anesthesia Evaluation    Airway        Dental   Pulmonary asthma , Current Smoker, PE Hx/o PTE          Cardiovascular hypertension, Pt. on medications and Pt. on home beta blockers + Valvular Problems/Murmurs      Neuro/Psych  Headaches, PSYCHIATRIC DISORDERS Anxiety Bipolar Disorder Hx/o PTSDHx/o Chiari I malformation Glaucoma    GI/Hepatic Neg liver ROS, GERD  Medicated,  Endo/Other  Obesity  Renal/GU negative Renal ROS  negative genitourinary   Musculoskeletal negative musculoskeletal ROS (+)   Abdominal   Peds  Hematology  (+) Blood dyscrasia, anemia , Lovenox therapy- last dose   Anesthesia Other Findings   Reproductive/Obstetrics (+) Pregnancy Hx/o cervical cerclage Hx/o HELLP syndrome previous pregnancy Hx/o previous C/Section Habitual abortion CF carrier CPD                             Anesthesia Physical Anesthesia Plan  ASA: 3  Anesthesia Plan: Spinal   Post-op Pain Management: Regional block* and Minimal or no pain anticipated   Induction: Intravenous  PONV Risk Score and Plan: 3 and Treatment may vary due to age or medical condition and Ondansetron  Airway Management Planned:   Additional Equipment: None  Intra-op Plan:   Post-operative Plan:   Informed Consent:   Plan Discussed with:   Anesthesia Plan Comments:         Anesthesia Quick Evaluation

## 2021-12-27 NOTE — Telephone Encounter (Signed)
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  The adjacent brain appears normal. The pituitary gland is enlarged consistent with late pregnancy. The brain is otherwise normal.  No acute findings.

## 2021-12-28 ENCOUNTER — Encounter (HOSPITAL_COMMUNITY): Payer: Self-pay | Admitting: Obstetrics and Gynecology

## 2021-12-28 ENCOUNTER — Other Ambulatory Visit: Payer: Self-pay

## 2021-12-28 ENCOUNTER — Inpatient Hospital Stay (HOSPITAL_COMMUNITY)
Admission: AD | Admit: 2021-12-28 | Discharge: 2022-01-02 | DRG: 786 | Disposition: A | Discharge status: Home / Self Care | Payer: No Typology Code available for payment source | Source: Ambulatory Visit | Attending: Obstetrics and Gynecology | Admitting: Obstetrics and Gynecology

## 2021-12-28 ENCOUNTER — Encounter (HOSPITAL_COMMUNITY)
Admission: AD | Disposition: A | Discharge status: Home / Self Care | Payer: Self-pay | Source: Ambulatory Visit | Attending: Obstetrics and Gynecology

## 2021-12-28 DIAGNOSIS — D62 Acute posthemorrhagic anemia: Secondary | ICD-10-CM | POA: Diagnosis not present

## 2021-12-28 DIAGNOSIS — O9902 Anemia complicating childbirth: Secondary | ICD-10-CM | POA: Diagnosis not present

## 2021-12-28 DIAGNOSIS — O99214 Obesity complicating childbirth: Secondary | ICD-10-CM | POA: Diagnosis present

## 2021-12-28 DIAGNOSIS — G935 Compression of brain: Secondary | ICD-10-CM | POA: Diagnosis present

## 2021-12-28 DIAGNOSIS — Z87410 Personal history of cervical dysplasia: Secondary | ICD-10-CM | POA: Diagnosis not present

## 2021-12-28 DIAGNOSIS — Z86711 Personal history of pulmonary embolism: Secondary | ICD-10-CM

## 2021-12-28 DIAGNOSIS — O99892 Other specified diseases and conditions complicating childbirth: Secondary | ICD-10-CM | POA: Diagnosis present

## 2021-12-28 DIAGNOSIS — O9081 Anemia of the puerperium: Secondary | ICD-10-CM | POA: Diagnosis not present

## 2021-12-28 DIAGNOSIS — Z141 Cystic fibrosis carrier: Secondary | ICD-10-CM

## 2021-12-28 DIAGNOSIS — O34211 Maternal care for low transverse scar from previous cesarean delivery: Secondary | ICD-10-CM | POA: Diagnosis present

## 2021-12-28 DIAGNOSIS — O164 Unspecified maternal hypertension, complicating childbirth: Secondary | ICD-10-CM | POA: Diagnosis not present

## 2021-12-28 DIAGNOSIS — Z3A37 37 weeks gestation of pregnancy: Secondary | ICD-10-CM

## 2021-12-28 DIAGNOSIS — F17210 Nicotine dependence, cigarettes, uncomplicated: Secondary | ICD-10-CM | POA: Diagnosis present

## 2021-12-28 DIAGNOSIS — Z98891 History of uterine scar from previous surgery: Secondary | ICD-10-CM

## 2021-12-28 DIAGNOSIS — O99334 Smoking (tobacco) complicating childbirth: Secondary | ICD-10-CM | POA: Diagnosis present

## 2021-12-28 DIAGNOSIS — D649 Anemia, unspecified: Secondary | ICD-10-CM | POA: Diagnosis not present

## 2021-12-28 DIAGNOSIS — O34219 Maternal care for unspecified type scar from previous cesarean delivery: Secondary | ICD-10-CM

## 2021-12-28 DIAGNOSIS — O99213 Obesity complicating pregnancy, third trimester: Secondary | ICD-10-CM | POA: Diagnosis present

## 2021-12-28 DIAGNOSIS — D509 Iron deficiency anemia, unspecified: Secondary | ICD-10-CM | POA: Diagnosis present

## 2021-12-28 DIAGNOSIS — Q0700 Arnold-Chiari syndrome without spina bifida or hydrocephalus: Secondary | ICD-10-CM

## 2021-12-28 DIAGNOSIS — O1002 Pre-existing essential hypertension complicating childbirth: Secondary | ICD-10-CM | POA: Diagnosis present

## 2021-12-28 DIAGNOSIS — Z7982 Long term (current) use of aspirin: Secondary | ICD-10-CM | POA: Diagnosis not present

## 2021-12-28 DIAGNOSIS — O10919 Unspecified pre-existing hypertension complicating pregnancy, unspecified trimester: Secondary | ICD-10-CM | POA: Diagnosis present

## 2021-12-28 DIAGNOSIS — O0990 Supervision of high risk pregnancy, unspecified, unspecified trimester: Principal | ICD-10-CM

## 2021-12-28 DIAGNOSIS — O09293 Supervision of pregnancy with other poor reproductive or obstetric history, third trimester: Secondary | ICD-10-CM

## 2021-12-28 HISTORY — DX: Sickle-cell trait: D57.3

## 2021-12-28 LAB — COMPREHENSIVE METABOLIC PANEL
ALT: 8 U/L (ref 0–44)
AST: 17 U/L (ref 15–41)
Albumin: 2.7 g/dL — ABNORMAL LOW (ref 3.5–5.0)
Alkaline Phosphatase: 161 U/L — ABNORMAL HIGH (ref 38–126)
Anion gap: 7 (ref 5–15)
BUN: <5 mg/dL — ABNORMAL LOW (ref 6–20)
CO2: 21 mmol/L — ABNORMAL LOW (ref 22–32)
Calcium: 9.4 mg/dL (ref 8.9–10.3)
Chloride: 108 mmol/L (ref 98–111)
Creatinine, Ser: 0.54 mg/dL (ref 0.44–1.00)
GFR, Estimated: >60 mL/min (ref >60)
Glucose, Bld: 94 mg/dL (ref 70–99)
Potassium: 3.8 mmol/L (ref 3.5–5.1)
Sodium: 136 mmol/L (ref 135–145)
Total Bilirubin: 0.2 mg/dL — ABNORMAL LOW (ref 0.3–1.2)
Total Protein: 5.7 g/dL — ABNORMAL LOW (ref 6.5–8.1)

## 2021-12-28 LAB — CBC
HCT: 30.9 % — ABNORMAL LOW (ref 36.0–46.0)
Hemoglobin: 9.7 g/dL — ABNORMAL LOW (ref 12.0–15.0)
MCH: 24.9 pg — ABNORMAL LOW (ref 26.0–34.0)
MCHC: 31.4 g/dL (ref 30.0–36.0)
MCV: 79.2 fL — ABNORMAL LOW (ref 80.0–100.0)
Platelets: 410 10*3/uL — ABNORMAL HIGH (ref 150–400)
RBC: 3.9 MIL/uL (ref 3.87–5.11)
RDW: 15.4 % (ref 11.5–15.5)
WBC: 9.5 10*3/uL (ref 4.0–10.5)
nRBC: 1.4 % — ABNORMAL HIGH (ref 0.0–0.2)

## 2021-12-28 LAB — GROUP B STREP BY PCR: Group B strep by PCR: NEGATIVE

## 2021-12-28 LAB — ABO/RH: ABO/RH(D): O POS

## 2021-12-28 SURGERY — Surgical Case
Anesthesia: Regional

## 2021-12-28 MED ORDER — DIPHENHYDRAMINE HCL 50 MG/ML IJ SOLN
INTRAMUSCULAR | Status: AC
  Filled 2021-12-28: qty 1

## 2021-12-28 MED ORDER — GENTAMICIN SULFATE 40 MG/ML IJ SOLN
5.0000 mg/kg | INTRAVENOUS | Status: DC
  Filled 2021-12-28: qty 9.25

## 2021-12-28 MED ORDER — OXYTOCIN BOLUS FROM INFUSION: 333.0000 mL | Freq: Once | INTRAVENOUS | Status: DC

## 2021-12-28 MED ORDER — ONDANSETRON HCL 4 MG/2ML IJ SOLN
4.0000 mg | Freq: Four times a day (QID) | INTRAMUSCULAR | Status: DC | PRN
  Administered 2021-12-28: 4 mg via INTRAVENOUS
  Filled 2021-12-28: qty 2

## 2021-12-28 MED ORDER — OXYCODONE-ACETAMINOPHEN 5-325 MG PO TABS: 2.0000 | ORAL_TABLET | ORAL | Status: DC | PRN

## 2021-12-28 MED ORDER — LABETALOL HCL 200 MG PO TABS
400.0000 mg | ORAL_TABLET | Freq: Three times a day (TID) | ORAL | Status: DC
  Administered 2021-12-28: 400 mg via ORAL
  Filled 2021-12-28: qty 2

## 2021-12-28 MED ORDER — ACETAMINOPHEN 325 MG PO TABS
650.0000 mg | ORAL_TABLET | ORAL | Status: DC | PRN
  Administered 2021-12-29: 650 mg via ORAL
  Filled 2021-12-28: qty 2

## 2021-12-28 MED ORDER — DEXAMETHASONE SODIUM PHOSPHATE 10 MG/ML IJ SOLN
INTRAMUSCULAR | Status: AC
  Filled 2021-12-28: qty 1

## 2021-12-28 MED ORDER — SOD CITRATE-CITRIC ACID 500-334 MG/5ML PO SOLN
ORAL | Status: AC
  Filled 2021-12-28: qty 30

## 2021-12-28 MED ORDER — MORPHINE SULFATE (PF) 0.5 MG/ML IJ SOLN
INTRAMUSCULAR | Status: AC
  Filled 2021-12-28: qty 10

## 2021-12-28 MED ORDER — ONDANSETRON HCL 4 MG/2ML IJ SOLN
INTRAMUSCULAR | Status: AC
  Filled 2021-12-28: qty 2

## 2021-12-28 MED ORDER — LABETALOL HCL 200 MG PO TABS
400.0000 mg | ORAL_TABLET | Freq: Three times a day (TID) | ORAL | Status: DC
  Administered 2021-12-28 – 2021-12-30 (×5): 400 mg via ORAL
  Filled 2021-12-28 (×5): qty 2

## 2021-12-28 MED ORDER — OXYTOCIN-SODIUM CHLORIDE 30-0.9 UT/500ML-% IV SOLN
2.5000 [IU]/h | INTRAVENOUS | Status: DC
  Filled 2021-12-28: qty 500

## 2021-12-28 MED ORDER — SCOPOLAMINE 1 MG/3DAYS TD PT72: 1.0000 | MEDICATED_PATCH | TRANSDERMAL | Status: DC

## 2021-12-28 MED ORDER — OXYCODONE-ACETAMINOPHEN 5-325 MG PO TABS: 1.0000 | ORAL_TABLET | ORAL | Status: DC | PRN

## 2021-12-28 MED ORDER — VANCOMYCIN HCL 1.5 G IV SOLR
1500.0000 mg | Freq: Once | INTRAVENOUS | Status: AC
  Administered 2021-12-28: 1500 mg via INTRAVENOUS
  Filled 2021-12-28: qty 30

## 2021-12-28 MED ORDER — LACTATED RINGERS IV SOLN: INTRAVENOUS | Status: DC

## 2021-12-28 MED ORDER — OXYTOCIN-SODIUM CHLORIDE 30-0.9 UT/500ML-% IV SOLN
1.0000 m[IU]/min | INTRAVENOUS | Status: DC
  Administered 2021-12-28: 2 m[IU]/min via INTRAVENOUS
  Administered 2021-12-30: 8 m[IU]/min via INTRAVENOUS

## 2021-12-28 MED ORDER — DIPHENHYDRAMINE HCL 50 MG/ML IJ SOLN
25.0000 mg | Freq: Once | INTRAMUSCULAR | Status: AC
  Administered 2021-12-28: 25 mg via INTRAVENOUS

## 2021-12-28 MED ORDER — PHENYLEPHRINE HCL-NACL 20-0.9 MG/250ML-% IV SOLN
INTRAVENOUS | Status: AC
  Filled 2021-12-28: qty 250

## 2021-12-28 MED ORDER — FENTANYL CITRATE (PF) 100 MCG/2ML IJ SOLN
INTRAMUSCULAR | Status: AC
  Filled 2021-12-28: qty 2

## 2021-12-28 MED ORDER — SOD CITRATE-CITRIC ACID 500-334 MG/5ML PO SOLN
30.0000 mL | ORAL | Status: DC | PRN
  Administered 2021-12-30: 30 mL via ORAL
  Filled 2021-12-28: qty 30

## 2021-12-28 MED ORDER — SCOPOLAMINE 1 MG/3DAYS TD PT72
MEDICATED_PATCH | TRANSDERMAL | Status: AC
  Filled 2021-12-28: qty 1

## 2021-12-28 MED ORDER — TERBUTALINE SULFATE 1 MG/ML IJ SOLN: 0.2500 mg | Freq: Once | INTRAMUSCULAR | Status: DC | PRN

## 2021-12-28 MED ORDER — LIDOCAINE HCL (PF) 1 % IJ SOLN: 30.0000 mL | INTRAMUSCULAR | Status: DC | PRN

## 2021-12-28 MED ORDER — LACTATED RINGERS IV SOLN: 500.0000 mL | INTRAVENOUS | Status: DC | PRN

## 2021-12-28 NOTE — Progress Notes (Signed)
Kimberly Stark is a 31 y.o. [id number] at [redacted]w[redacted]d admitted for worsening cHTN.  Subjective: Kimberly Stark is doing well. Reports no pain. She has no questions/concerns at this time. Family is present at bedside.   Objective: BP 139/81   Pulse 92   Temp 98.4 F (36.9 C) (Oral)   Resp 16   Ht 5\' 4"  (1.626 m)   Wt 102.4 kg   LMP 03/22/2021   Breastfeeding Yes   BMI 38.76 kg/m  No intake/output data recorded. No intake/output data recorded.  FHT: 135 bpm, moderate variability, +15x15 accels, no decels UC: Q 3-20mins SVE:   Dilation: Fingertip Effacement (%): 50 Station: Ballotable Exam by:: Maryagnes Amos, CNMW  Labs: Lab Results  Component Value Date   WBC 9.5 12/28/2021   HGB 9.7 (L) 12/28/2021   HCT 30.9 (L) 12/28/2021   MCV 79.2 (L) 12/28/2021   PLT 410 (H) 12/28/2021    Assessment / Plan: Kimberly Stark is a 31 y.o. [id number] at [redacted]w[redacted]d admitted for worsening cHTN   Labor: TOLAC. Pitocin currently at 1mu/min. Attempt FB insertion at next exam.  cHTN: BP's have been mild range. Patient is asymptomatic. PEC wnl. Continue labetalol 400mg  TID Fetal Wellbeing:  Category I Pain Control:  epidural prn I/D:  GBS neg Anticipated MOD:   hopeful for [place], CNM 12/28/2021, 6:20 PM

## 2021-12-28 NOTE — H&P (Signed)
OBSTETRIC ADMISSION HISTORY AND PHYSICAL  Kimberly Stark is a 31 y.o. female [phone] with IUP at [redacted]w[redacted]d by early [place] presenting for scheduled rLTCS. She reports +FMs, No LOF, no VB, no blurry vision, headaches or peripheral edema, and RUQ pain.  She plans on breast feeding. She is unsure what she would like for birth control.   She received her prenatal care at  [facility]: By 11 wk [place] --->  Estimated Date of Delivery: 01/18/22  [facility]:    @[redacted]w[redacted]d , CWD, normal anatomy, cephalic presentation, anterior fundal placental lie, 2801g, 45% EFW   Prenatal History/Complications:  -IDA (Hx of pp transfusion 1 unit following c/s) -cHTN (labetalol 400 mg TID) -Arnold-Chiari malformation -Hx of PE 2021 (lovenox; last dose 10/17 @5pm ) -Hx of c/s (arrest of descent, NRFHT) -Obesity -Hx of LEEP, cerclage prior preg w/ preterm del -CF carrier   Past Medical History: Past Medical History:  Diagnosis Date   Anemia    Asthma    Bipolar 1 disorder ([place])    Blood transfusion without reported diagnosis    Chiari malformation type I ([place])    Heart murmur    History of postpartum hemorrhage    History of pulmonary embolism 2021   in setting of covid   Pregnancy induced hypertension    Preterm labor    UTI (urinary tract infection)     Past Surgical History: Past Surgical History:  Procedure Laterality Date   CERVICAL BIOPSY  W/ LOOP ELECTRODE EXCISION     CERVICAL CERCLAGE     CESAREAN SECTION     DILATION AND CURETTAGE OF UTERUS      Obstetrical History: OB History     Gravida  11   Para  2   Term  1   Preterm  1   AB  8   Living  2      SAB  7   IAB  1   Ectopic  0   Multiple  0   Live Births  2        Obstetric Comments  Pre-E with both preg, induced with both         Social History Social History   Socioeconomic History   Marital status: Single    Spouse name: Not on file   Number of children: Not on file   Years of education: Not on file    Highest education level: Bachelor's degree (e.g., BA, AB, BS)  Occupational History   Not on file  Tobacco Use   Smoking status: Some Days    Packs/day: 0.25    Types: Cigarettes   Smokeless tobacco: Never  Vaping Use   Vaping Use: Every day   Substances: Nicotine  Substance and Sexual Activity   Alcohol use: Not Currently   Drug use: Not Currently   Sexual activity: Yes  Other Topics Concern   Not on file  Social History Narrative   Not on file   Social Determinants of Health   Financial Resource Strain: High Risk (10/19/2021)   Overall Financial Resource Strain (CARDIA)    Difficulty of Paying Living Expenses: Very hard  Food Insecurity: No Food Insecurity (12/01/2021)   Hunger Vital Sign    Worried About Running Out of Food in the Last Year: Never true    Ran Out of Food in the Last Year: Never true  Recent Concern: Food Insecurity - Food Insecurity Present (10/19/2021)   Hunger Vital Sign    Worried About Running Out  of Food in the Last Year: Sometimes true    Ran Out of Food in the Last Year: Sometimes true  Transportation Needs: No Transportation Needs (12/01/2021)   PRAPARE - Hydrologist (Medical): No    Lack of Transportation (Non-Medical): No  Recent Concern: Transportation Needs - Unmet Transportation Needs (10/19/2021)   PRAPARE - Transportation    Lack of Transportation (Medical): Yes    Lack of Transportation (Non-Medical): Yes  Physical Activity: Insufficiently Active (10/19/2021)   Exercise Vital Sign    Days of Exercise per Week: 2 days    Minutes of Exercise per Session: 30 min  Stress: Stress Concern Present (10/19/2021)   [place]    Feeling of Stress : Rather much  Social Connections: Moderately Integrated (10/19/2021)   Social Connection and Isolation Panel [NHANES]    Frequency of Communication with Friends and Family: Once a week    Frequency of Social  Gatherings with Friends and Family: Three times a week    Attends Religious Services: More than 4 times per year    Active Member of Clubs or Organizations: No    Attends Music therapist: Not on file    Marital Status: Living with partner    Family History: Family History  Problem Relation Age of Onset   Stroke Mother    Heart disease Mother        hx open heart issues   Hypertension Mother    Diabetes Mother    Cancer Father        started as colon, w/mets   Kidney disease Father        renal failure   Diabetes Father    Cancer Maternal Grandmother    Diabetes Paternal Grandmother     Allergies: Allergies  Allergen Reactions   Iodinated Contrast Media Shortness Of Breath, Rash and Swelling   Latex Anaphylaxis   Shellfish Allergy Anaphylaxis   Iodine Hives   Penicillins Hives    Medications Prior to Admission  Medication Sig Dispense Refill Last Dose   acetaminophen (TYLENOL) 500 MG tablet Take 1,000 mg by mouth every 6 (six) hours as needed (headaches/pain.).   Past Week   albuterol (VENTOLIN HFA) 108 (90 Base) MCG/ACT inhaler Inhale 1-2 puffs into the lungs every 6 (six) hours as needed for wheezing or shortness of breath.   Past Month   aspirin EC 81 MG tablet Take 1 tablet (81 mg total) by mouth daily. Swallow whole. 30 tablet 11 12/28/2021   calcium carbonate (TUMS - DOSED IN MG ELEMENTAL CALCIUM) 500 MG chewable tablet Chew 2-3 tablets by mouth 3 (three) times daily as needed for indigestion or heartburn.   Past Week   Cholecalciferol (VITAMIN D3 PO) Take 1,000 Units by mouth in the morning.   12/27/2021   enoxaparin (LOVENOX) 40 MG/0.4ML injection Inject 0.4 mLs (40 mg total) into the skin daily. (Patient taking differently: Inject 40 mg into the skin every evening.) 12 mL 8 12/27/2021   famotidine (PEPCID) 20 MG tablet Take 1 tablet (20 mg total) by mouth 2 (two) times daily. (Patient taking differently: Take 20 mg by mouth in the morning.) 60 tablet 2  12/27/2021   ferrous sulfate 325 (65 FE) MG EC tablet Take 325 mg by mouth in the morning.   12/27/2021   labetalol (NORMODYNE) 200 MG tablet Take 2 tablets (400 mg total) by mouth 3 (three) times daily. 90 tablet 0 12/27/2021  ondansetron (ZOFRAN) 4 MG tablet Take 1 tablet (4 mg total) by mouth every 8 (eight) hours as needed for nausea or vomiting. 20 tablet 0 12/27/2021   Prenatal MV & Min w/FA-DHA (PRENATAL GUMMIES PO) Take 2 tablets by mouth in the morning.   12/27/2021   sertraline (ZOLOFT) 100 MG tablet Take 100 mg by mouth in the morning.   12/27/2021     Review of Systems   All systems reviewed and negative except as stated in HPI  Last menstrual period 03/22/2021, currently breastfeeding. General appearance: alert and no distress Lungs: clear to auscultation bilaterally Heart: regular rate and rhythm Abdomen: soft, non-tender; bowel sounds normal Pelvic: deferred Extremities: Homans sign is negative, no sign of DVT Presentation: cephalic u/s Fetal [id number]     Prenatal labs: ABO, Rh: --/--/PENDING (10/18 0859) Antibody: POS (10/16 [id number]) Rubella: 6.18 (04/12 1647) RPR: NON REACTIVE (10/16 0929)  HBsAg: Negative (04/12 1647)  HIV: Non Reactive (08/09 0848)  GBS:   unknown 2 hr tgg wnl Genetic screening  LR, CF carrier Anatomy US wnl  Prenatal Transfer Tool  Maternal Diabetes: No Genetic Screening: Normal Maternal Ultrasounds/Referrals: Normal Fetal Ultrasounds or other Referrals:  None Maternal Substance Abuse:  No Significant Maternal Medications:  None Significant Maternal Lab Results:  None Number of Prenatal Visits:greater than 3 verified prenatal visits Other Comments:  None  Results for orders placed or performed during the hospital encounter of 12/28/21 (from the past 24 hour(s))  ABO/Rh   Collection Time: 12/28/21  8:59 AM  Result Value Ref Range   ABO/RH(D) PENDING     Patient Active Problem List   Diagnosis Date Noted   Habitual  aborter, currently pregnant 12/22/2021   Fall 12/22/2021   Arnold-Chiari malformation ([place]) 12/13/2021   History of LEEP (loop electrosurgical excision procedure) of cervix complicating pregnancy in third trimester 11/21/2021   BMI 38.0-38.9,adult 0000000   Obesity complicating pregnancy in third trimester 11/21/2021   History of preterm delivery, currently pregnant in third trimester 11/21/2021   Chiari malformation type I ([place]) 11/21/2021   Anticoagulated 11/21/2021   Migraine with typical aura 09/19/2021   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/2021   Insomnia 09/19/2021   Glaucoma 09/19/2021   Chronic post-traumatic stress disorder 09/19/2021   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   History of pulmonary embolus (PE) 06/22/2021   Tobacco abuse 06/22/2021   History of C-section 06/22/2021   History of HELLP syndrome, currently pregnant, third trimester 06/09/2021   Supervision of high risk pregnancy, antepartum 06/09/2021   History of cervical cerclage 06/09/2021   CPD (cephalo-pelvic disproportion) 08/15/2019   Iron deficiency anemia 07/03/2019   Chronic hypertension 05/23/2019    Assessment/Plan:  Kimberly Stark is a 31 y.o. [id number] at [redacted]w[redacted]d here for scheduled  elective repeat c-section. She has a history of one prior vaginal delivery of a 6-some pound baby followed by a c/s for second stage arrest (failed pushing) of a 9-some pound baby. She wants to [facility] and says she doesn't recall having a discussion about risks and benefits with prior providers. Today we discussed in depth the risks and benefits of TOLAC. We discussed risks to baby with vaginal versus cesarean deliver, risks to mother with vaginal versus cesarean delivery, uterine rupture and its risks. With her prior successful vaginal delivery patient is a good candidate for TOLAC. She may also want future children. After discussion of these risks and benefits patient elects to proceed with TOLAC.  # IOL: cervical ripening  per L and  D team  # cHTN: hx preE in prior pregnancies. Chtn diagnosed early this pregnancy, was therefore not on meds prior to this pregnancy. MFM advised delivery at 37 weeks given up-trending bps and need for higher doses of labetalol. Currently on 200 tid. Today's pressures are mild/moderate, hellp labs are negative. Will plan to continue home labetalol, monitor bp. She denies ha, vision change, or other symptoms of severe preE  # History VTE in prior pregnancy: # History recurrent miscarriage # Family history antiphospholipid syndrome Has been on prophylactic dose lovenox, held after yesterday's dose, will plan to resume postpartum and treat for 6 weeks. Given her risks for thrombophilic disease advise hematology f/u postpartum  # Anemia Hgb of 9.7 is noted  # Feeding: breast/bottle  # Contraception Declines BTL. Is undecided.   # Circ: n/a

## 2021-12-28 NOTE — Progress Notes (Signed)
Kimberly Stark is a 31 y.o. [id number] at [redacted]w[redacted]d admitted for worsening cHTN.  Subjective: No acute concerns.  Objective: BP (!) 144/62   Pulse 96   Temp 98.4 F (36.9 C) (Oral)   Resp 15   Ht 5\' 4"  (1.626 m)   Wt 102.4 kg   LMP 03/22/2021   Breastfeeding Yes   BMI 38.76 kg/m  No intake/output data recorded. No intake/output data recorded.  FHT: 125 bpm, moderate variability, 15x15 accels, no decels SVE:   Dilation: Fingertip Effacement (%): 50 Station: Ballotable Exam by:: Maryagnes Amos, CNMW  Labs: Lab Results  Component Value Date   WBC 9.5 12/28/2021   HGB 9.7 (L) 12/28/2021   HCT 30.9 (L) 12/28/2021   MCV 79.2 (L) 12/28/2021   PLT 410 (H) 12/28/2021    Assessment / Plan: Kimberly Stark is a 31 y.o. [id number] at [redacted]w[redacted]d admitted for worsening cHTN.  Labor: TOLAC. Pitocin currently at 62mu/min. Attempt FB insertion at next exam.  cHTN:  Continue labetalol 400mg  TID Fetal Wellbeing:  Category I Pain Control:  epidural prn I/D:  GBS neg Anticipated MOD:   hopeful for VBAC   Kimberly Vogan Autry-Lott, DO 12/28/2021, 9:42 PM

## 2021-12-29 MED ORDER — FENTANYL CITRATE (PF) 100 MCG/2ML IJ SOLN
INTRAMUSCULAR | Status: AC
  Filled 2021-12-29: qty 2

## 2021-12-29 MED ORDER — FENTANYL CITRATE (PF) 100 MCG/2ML IJ SOLN
100.0000 ug | Freq: Once | INTRAMUSCULAR | Status: AC
  Administered 2021-12-29: 100 ug via INTRAVENOUS

## 2021-12-29 NOTE — Progress Notes (Signed)
Kimberly Stark is a 31 y.o. [id number] at [redacted]w[redacted]d by ultrasound admitted for induction of labor due to worsening cHTN. TOLAC following arrest of descent with a 9lb baby. Current pregnancy EFW 45%.   Subjective: No acute concerns.  Objective: BP 133/81   Pulse 98   Temp 98 F (36.7 C) (Oral)   Resp 15   Ht 5\' 4"  (1.626 m)   Wt 102.4 kg   LMP 03/22/2021   Breastfeeding Yes   BMI 38.76 kg/m  No intake/output data recorded. No intake/output data recorded.  FHT:  FHR: 125 bpm, variability: moderate,  accelerations:  Present,  decelerations:  Absent  SVE:   Dilation: 4 Effacement (%): 60 Station: Ballotable Exam by:: Shay CNM  Labs: Lab Results  Component Value Date   WBC 9.5 12/28/2021   HGB 9.7 (L) 12/28/2021   HCT 30.9 (L) 12/28/2021   MCV 79.2 (L) 12/28/2021   PLT 410 (H) 12/28/2021   Patient Vitals for the past 24 hrs:  BP Temp Temp src Pulse Resp  12/29/21 2104 133/81 -- -- 98 15  12/29/21 2001 123/68 -- -- 76 15  12/29/21 1912 (!) 144/82 98 F (36.7 C) Oral 77 17  12/29/21 1851 (!) 164/80 -- -- 90 --  12/29/21 1803 (!) 157/69 -- -- 85 --  12/29/21 1750 138/74 -- -- 85 --  12/29/21 1545 120/80 98.4 F (36.9 C) Oral 91 17  12/29/21 1437 (!) 140/72 -- -- 87 18  12/29/21 1338 (!) 152/83 -- -- 84 18  12/29/21 1301 (!) 145/79 -- -- 89 18  12/29/21 1231 130/77 -- -- 83 20  12/29/21 1212 -- 98.6 F (37 C) Oral -- --  12/29/21 1202 135/71 -- -- 84 20  12/29/21 1131 (!) 143/74 -- -- 87 20  12/29/21 1108 (!) 145/78 -- -- 89 17  12/29/21 1030 138/70 -- -- 85 20  12/29/21 1001 133/69 -- -- 85 18  12/29/21 0932 138/65 -- -- 90 17  12/29/21 0903 (!) 147/69 -- -- 91 18  12/29/21 0830 138/75 -- -- 86 18  12/29/21 0740 125/60 98 F (36.7 C) Oral 80 17  12/29/21 0702 132/68 -- -- 85 16  12/29/21 0615 (!) 144/85 -- -- (!) 103 15  12/29/21 0511 118/69 -- -- 85 --  12/29/21 0405 (!) 124/58 -- -- 85 14  12/29/21 0305 (!) 141/84 -- -- 80 16  12/29/21 0202 130/62 -- -- 81 15   12/29/21 0103 (!) 141/72 -- -- 79 15  12/29/21 0008 133/68 -- -- 79 15  12/28/21 2305 (!) 141/72 98.3 F (36.8 C) Oral 82 15  12/28/21 2201 136/60 -- -- 80 15  12/28/21 2128 (!) 152/77 -- -- 73 16      Assessment / Plan: Induction of labor due to induction of labor due to worsening cHTN,  s/p FB.    Labor: Plan to AROM at next cervical exam.  Preeclampsia:  labs stable Fetal Wellbeing:  Category I- continuously monitoring  Pain Control:  Labor support without medications I/D:   GBS neg  Anticipated MOD:  NSVD  Kimberly Arras Autry-Lott, DO 12/29/2021, 9:22 PM

## 2021-12-29 NOTE — Progress Notes (Signed)
Kimberly Stark is a 31 y.o. [id number] at [redacted]w[redacted]d admitted for worsening cHTN.  Subjective: No acute concerns.  Objective: BP (!) 141/72   Pulse 79   Temp 98.3 F (36.8 C) (Oral)   Resp 15   Ht 5\' 4"  (1.626 m)   Wt 102.4 kg   LMP 03/22/2021   Breastfeeding Yes   BMI 38.76 kg/m   FHT: 110 bpm, moderate variability, 15x15 accels, no decels SVE:   Dilation: 1 Effacement (%): Thick Station: Ballotable Exam by:: S Autrey-Lott, DO and Waynetta Pean, MD  Labs: Lab Results  Component Value Date   WBC 9.5 12/28/2021   HGB 9.7 (L) 12/28/2021   HCT 30.9 (L) 12/28/2021   MCV 79.2 (L) 12/28/2021   PLT 410 (H) 12/28/2021    Assessment / Plan: Kimberly Stark is a 31 y.o. [id number] at [redacted]w[redacted]d admitted for worsening cHTN.  Labor: TOLAC. Pitocin currently at 75mu/min. FB placed. Increase pit when FB out.  cHTN:  Continue labetalol 400mg  TID Fetal Wellbeing:  Category I Pain Control:  epidural prn I/D:  GBS neg Anticipated MOD:   hopeful for VBAC   Kimberly Mabey Autry-Lott, DO 12/29/2021, 1:27 AM

## 2021-12-29 NOTE — Progress Notes (Addendum)
Late Entry due to Labor and Delivery of Acuity:    Kimberly Stark is a 31 y.o. [id number] at [redacted]w[redacted]d by ultrasound admitted for induction of labor due to worsening cHTN. TOLAC following arrest of descent with a 9lb baby. Current pregnancy EFW 45%.   Subjective: Patient well and in good spirits. Family at bedside and supportive. Brief introductions exchanged.  Objective: BP 138/65   Pulse 90   Temp 98 F (36.7 C) (Oral)   Resp 17   Ht 5\' 4"  (1.626 m)   Wt 102.4 kg   LMP 03/22/2021   Breastfeeding Yes   BMI 38.76 kg/m  No intake/output data recorded. No intake/output data recorded.  FHT:  FHR: 120 bpm,  variability: moderate,  accelerations:  Present,  decelerations:  Absent UC:   irregular, every 4-8 minutes SVE:   Dilation: 1 Effacement (%): Thick Station: Ballotable Exam by:: S Autrey-Lott, DO and Waynetta Pean, MD   Labs: Lab Results  Component Value Date   WBC 9.5 12/28/2021   HGB 9.7 (L) 12/28/2021   HCT 30.9 (L) 12/28/2021   MCV 79.2 (L) 12/28/2021   PLT 410 (H) 12/28/2021    Patient Vitals for the past 24 hrs:  BP Temp Temp src Pulse Resp  12/29/21 1437 (!) 140/72 -- -- 87 18  12/29/21 1338 (!) 152/83 -- -- 84 18  12/29/21 1301 (!) 145/79 -- -- 89 18  12/29/21 1231 130/77 -- -- 83 20  12/29/21 1212 -- 98.6 F (37 C) Oral -- --  12/29/21 1202 135/71 -- -- 84 20  12/29/21 1131 (!) 143/74 -- -- 87 20  12/29/21 1108 (!) 145/78 -- -- 89 17  12/29/21 1030 138/70 -- -- 85 20  12/29/21 1001 133/69 -- -- 85 18  12/29/21 0932 138/65 -- -- 90 17  12/29/21 0903 (!) 147/69 -- -- 91 18  12/29/21 0830 138/75 -- -- 86 18  12/29/21 0740 125/60 98 F (36.7 C) Oral 80 17  12/29/21 0702 132/68 -- -- 85 16  12/29/21 0615 (!) 144/85 -- -- (!) 103 15  12/29/21 0511 118/69 -- -- 85 --  12/29/21 0405 (!) 124/58 -- -- 85 14  12/29/21 0305 (!) 141/84 -- -- 80 16  12/29/21 0202 130/62 -- -- 81 15  12/29/21 0103 (!) 141/72 -- -- 79 15  12/29/21 0008 133/68 -- -- 79 15  12/28/21 2305 (!) 141/72 98.3 F (36.8 C) Oral 82 15  12/28/21 2201 136/60 -- -- 80 15  12/28/21 2128 (!) 152/77 -- -- 73 16  12/28/21 2000 (!) 144/62 -- -- 96 15  12/28/21 1900 (!) 147/71 -- -- 86 --  12/28/21 1838 (!) 158/67 -- -- 82 --  12/28/21 1816 139/81 -- -- 92 --  12/28/21 1811 (!) 160/76 -- -- 87 --  12/28/21 1729 (!) 142/67 -- -- 90 --  12/28/21 1728 (!) 155/80 -- -- 93 --  12/28/21 1711 130/74 -- -- 98 --  12/28/21 1632 137/62 -- -- 86 --  12/28/21 1602 138/67 -- -- 92 --  12/28/21 1531 (!) 121/54 -- -- 95 --  12/28/21 1507 (!) 146/70 -- -- 91 --    Assessment / Plan: Induction of labor due to  worsening cHTN,  progressing well on pitocin s/p foley balloon.   Labor: Progressing on Pitocin, will continue to increase then AROM Preeclampsia:  labs stable, BPs elevated but not severe range. Continue to monitor for worsening signs of preE.  Fetal Wellbeing:  Category I- continue to monitor for signs of fetal distress.  Pain Control:  Labor support without medications. Patient considering a Epidural for labor. Patient may have an epidural upon request.  I/D:   GBS Neg  Anticipated MOD:   Hopeful for VBAC at this time.   @10 :12- CNM checked patient as foley balloon out. 3.5/60/-3 S. Rollene Rotunda CNM  - Continue to to  titrate pit 2x2 as needed and then assess for  AROM.   Claudette Head, CNM 12/29/2021, 9:52 AM

## 2021-12-29 NOTE — Progress Notes (Signed)
Kimberly Stark is a 31 y.o. [id number] at [redacted]w[redacted]d by ultrasound admitted for induction of labor due to worsening cHTN. TOLAC following arrest of descent with a 9lb baby. Current pregnancy EFW 45%.   Subjective: No acute concerns.  Objective: BP 132/60   Pulse 86   Temp 98.1 F (36.7 C) (Oral)   Resp 15   Ht 5\' 4"  (1.626 m)   Wt 102.4 kg   LMP 03/22/2021   Breastfeeding Yes   BMI 38.76 kg/m  No intake/output data recorded. No intake/output data recorded.  FHT:  FHR: 135 bpm, variability: moderate,  accelerations:  Present,  decelerations:  Absent  SVE:   Dilation: 4 Effacement (%): 60 Station: Ballotable Exam by:: Shay CNM  Labs: Lab Results  Component Value Date   WBC 9.5 12/28/2021   HGB 9.7 (L) 12/28/2021   HCT 30.9 (L) 12/28/2021   MCV 79.2 (L) 12/28/2021   PLT 410 (H) 12/28/2021   Patient Vitals for the past 24 hrs:  BP Temp Temp src Pulse Resp  12/29/21 2301 132/60 98.1 F (36.7 C) Oral 86 15  12/29/21 2104 133/81 -- -- 98 15  12/29/21 2001 123/68 -- -- 76 15  12/29/21 1912 (!) 144/82 98 F (36.7 C) Oral 77 17  12/29/21 1851 (!) 164/80 -- -- 90 --  12/29/21 1803 (!) 157/69 -- -- 85 --  12/29/21 1750 138/74 -- -- 85 --  12/29/21 1545 120/80 98.4 F (36.9 C) Oral 91 17  12/29/21 1437 (!) 140/72 -- -- 87 18  12/29/21 1338 (!) 152/83 -- -- 84 18  12/29/21 1301 (!) 145/79 -- -- 89 18  12/29/21 1231 130/77 -- -- 83 20  12/29/21 1212 -- 98.6 F (37 C) Oral -- --  12/29/21 1202 135/71 -- -- 84 20  12/29/21 1131 (!) 143/74 -- -- 87 20  12/29/21 1108 (!) 145/78 -- -- 89 17  12/29/21 1030 138/70 -- -- 85 20  12/29/21 1001 133/69 -- -- 85 18  12/29/21 0932 138/65 -- -- 90 17  12/29/21 0903 (!) 147/69 -- -- 91 18  12/29/21 0830 138/75 -- -- 86 18  12/29/21 0740 125/60 98 F (36.7 C) Oral 80 17  12/29/21 0702 132/68 -- -- 85 16  12/29/21 0615 (!) 144/85 -- -- (!) 103 15  12/29/21 0511 118/69 -- -- 85 --  12/29/21 0405 (!) 124/58 -- -- 85 14  12/29/21 0305 (!)  141/84 -- -- 80 16  12/29/21 0202 130/62 -- -- 81 15  12/29/21 0103 (!) 141/72 -- -- 79 15  12/29/21 0008 133/68 -- -- 79 15      Assessment / Plan: Induction of labor due to induction of labor due to worsening cHTN,  s/p FB.    Labor: AROM scant fluid. FSE placed 2/2 trouble tracing from external monitors. IUPC placed 2/2 unchanged cervix. Can consider pit break if variability continue to swing from minimal to marked.  Preeclampsia:  labs stable Fetal Wellbeing:  Category I- continuously monitoring as above Pain Control:  Labor support without medications I/D:   GBS neg  Anticipated MOD:  NSVD  Tressa Maldonado Autry-Lott, DO 12/29/2021, 11:52 PM

## 2021-12-29 NOTE — Progress Notes (Signed)
Discussed with provider that FHR has shown marked variability for past hour or so; patient has tried various position changes and walked around unit as well. Plan, per provider, is for DO to perform SVE around 0000 and if patient is not rupture-able, DO may decide to give patient a pitocin break to see if FHR variability returns to normal moderate. VD RN

## 2021-12-29 NOTE — Progress Notes (Signed)
Kimberly Stark is a 31 y.o. [id number] at [redacted]w[redacted]d by ultrasound admitted for induction of labor due to worsening cHTN. TOLAC following arrest of descent with a 9lb baby. Current pregnancy EFW 45%.   Subjective: Patient starting to feel consistent contractions   Objective: BP 123/68   Pulse 76   Temp 98 F (36.7 C) (Oral)   Resp 15   Ht 5\' 4"  (1.626 m)   Wt 102.4 kg   LMP 03/22/2021   Breastfeeding Yes   BMI 38.76 kg/m  No intake/output data recorded. No intake/output data recorded.  FHT:  FHR: 125 bpm, variability: moderate,  accelerations:  Present,  decelerations:  Absent UC:   regular, every 2-4 minutes SVE:   Dilation: 4 Effacement (%): 60 Station: Ballotable Exam by:: Shay CNM  Labs: Lab Results  Component Value Date   WBC 9.5 12/28/2021   HGB 9.7 (L) 12/28/2021   HCT 30.9 (L) 12/28/2021   MCV 79.2 (L) 12/28/2021   PLT 410 (H) 12/28/2021   Patient Vitals for the past 24 hrs:  BP Temp Temp src Pulse Resp  12/29/21 2001 123/68 -- -- 76 15  12/29/21 1912 (!) 144/82 98 F (36.7 C) Oral 77 17  12/29/21 1851 (!) 164/80 -- -- 90 --  12/29/21 1803 (!) 157/69 -- -- 85 --  12/29/21 1750 138/74 -- -- 85 --  12/29/21 1545 120/80 98.4 F (36.9 C) Oral 91 17  12/29/21 1437 (!) 140/72 -- -- 87 18  12/29/21 1338 (!) 152/83 -- -- 84 18  12/29/21 1301 (!) 145/79 -- -- 89 18  12/29/21 1231 130/77 -- -- 83 20  12/29/21 1212 -- 98.6 F (37 C) Oral -- --  12/29/21 1202 135/71 -- -- 84 20  12/29/21 1131 (!) 143/74 -- -- 87 20  12/29/21 1108 (!) 145/78 -- -- 89 17  12/29/21 1030 138/70 -- -- 85 20  12/29/21 1001 133/69 -- -- 85 18  12/29/21 0932 138/65 -- -- 90 17  12/29/21 0903 (!) 147/69 -- -- 91 18  12/29/21 0830 138/75 -- -- 86 18  12/29/21 0740 125/60 98 F (36.7 C) Oral 80 17  12/29/21 0702 132/68 -- -- 85 16  12/29/21 0615 (!) 144/85 -- -- (!) 103 15  12/29/21 0511 118/69 -- -- 85 --  12/29/21 0405 (!) 124/58 -- -- 85 14  12/29/21 0305 (!) 141/84 -- -- 80 16   12/29/21 0202 130/62 -- -- 81 15  12/29/21 0103 (!) 141/72 -- -- 79 15  12/29/21 0008 133/68 -- -- 79 15  12/28/21 2305 (!) 141/72 98.3 F (36.8 C) Oral 82 15  12/28/21 2201 136/60 -- -- 80 15  12/28/21 2128 (!) 152/77 -- -- 73 16     Assessment / Plan: Induction of labor due to induction of labor due to worsening cHTN,  s/p FB.    Labor:  CNM to bedside to attempt AROM. Hx of Leep x2 evident. And fetal head ballotable. AROM attempt unsuccessful at this time. Patient up to 18 of Pit. Encouraged patient to walk and get out bed to help with descent. Then reassess for AROM  Preeclampsia:  labs stable Fetal Wellbeing:  Category I- continuously monitoring  Pain Control:  Labor support without medications I/D:   GBS neg  Anticipated MOD:  NSVD  Jacquiline Doe, CNM 12/29/2021, 8:30 PM

## 2021-12-29 NOTE — Progress Notes (Signed)
Kimberly Stark is a 31 y.o. [id number] at [redacted]w[redacted]d admitted for worsening cHTN.  Subjective: No acute concerns.  Objective: BP 118/69   Pulse 85   Temp 98.3 F (36.8 C) (Oral)   Resp 14   Ht 5\' 4"  (1.626 m)   Wt 102.4 kg   LMP 03/22/2021   Breastfeeding Yes   BMI 38.76 kg/m   FHT: 120 bpm, moderate variability, 15x15 accels, no decels SVE:   Dilation: 1 Effacement (%): Thick Station: Ballotable Exam by:: S Autrey-Lott, DO and Waynetta Pean, MD  Labs: Lab Results  Component Value Date   WBC 9.5 12/28/2021   HGB 9.7 (L) 12/28/2021   HCT 30.9 (L) 12/28/2021   MCV 79.2 (L) 12/28/2021   PLT 410 (H) 12/28/2021    Assessment / Plan: Kimberly Stark is a 31 y.o. [id number] at [redacted]w[redacted]d admitted for worsening cHTN.  Labor: TOLAC. Pitocin currently at 38mu/min. FB placed. Increase pit when FB out.  cHTN:  Continue labetalol 400mg  TID Fetal Wellbeing:  Category I Pain Control:  epidural prn I/D:  GBS neg Anticipated MOD:   hopeful for VBAC   Donte Kary Autry-Lott, DO 12/29/2021, 6:23 AM

## 2021-12-30 ENCOUNTER — Encounter (HOSPITAL_COMMUNITY): Payer: Self-pay | Admitting: Obstetrics and Gynecology

## 2021-12-30 ENCOUNTER — Inpatient Hospital Stay (HOSPITAL_COMMUNITY): Payer: No Typology Code available for payment source

## 2021-12-30 ENCOUNTER — Encounter (HOSPITAL_COMMUNITY)
Admission: AD | Disposition: A | Discharge status: Home / Self Care | Payer: Self-pay | Source: Ambulatory Visit | Attending: Obstetrics and Gynecology

## 2021-12-30 DIAGNOSIS — Z3A37 37 weeks gestation of pregnancy: Secondary | ICD-10-CM

## 2021-12-30 DIAGNOSIS — O1002 Pre-existing essential hypertension complicating childbirth: Secondary | ICD-10-CM

## 2021-12-30 DIAGNOSIS — O34219 Maternal care for unspecified type scar from previous cesarean delivery: Secondary | ICD-10-CM

## 2021-12-30 DIAGNOSIS — O9902 Anemia complicating childbirth: Secondary | ICD-10-CM

## 2021-12-30 DIAGNOSIS — O34211 Maternal care for low transverse scar from previous cesarean delivery: Secondary | ICD-10-CM

## 2021-12-30 DIAGNOSIS — O164 Unspecified maternal hypertension, complicating childbirth: Secondary | ICD-10-CM

## 2021-12-30 DIAGNOSIS — O99214 Obesity complicating childbirth: Secondary | ICD-10-CM

## 2021-12-30 DIAGNOSIS — D649 Anemia, unspecified: Secondary | ICD-10-CM

## 2021-12-30 LAB — CBC WITH DIFFERENTIAL/PLATELET
Abs Immature Granulocytes: 0.14 10*3/uL — ABNORMAL HIGH (ref 0.00–0.07)
Basophils Absolute: 0 10*3/uL (ref 0.0–0.1)
Basophils Relative: 0 %
Eosinophils Absolute: 0.2 10*3/uL (ref 0.0–0.5)
Eosinophils Relative: 1 %
HCT: 29.3 % — ABNORMAL LOW (ref 36.0–46.0)
Hemoglobin: 8.9 g/dL — ABNORMAL LOW (ref 12.0–15.0)
Immature Granulocytes: 1 %
Lymphocytes Relative: 15 %
Lymphs Abs: 1.7 10*3/uL (ref 0.7–4.0)
MCH: 24.5 pg — ABNORMAL LOW (ref 26.0–34.0)
MCHC: 30.4 g/dL (ref 30.0–36.0)
MCV: 80.5 fL (ref 80.0–100.0)
Monocytes Absolute: 1 10*3/uL (ref 0.1–1.0)
Monocytes Relative: 9 %
Neutro Abs: 8.2 10*3/uL — ABNORMAL HIGH (ref 1.7–7.7)
Neutrophils Relative %: 74 %
Platelets: 387 10*3/uL (ref 150–400)
RBC: 3.64 MIL/uL — ABNORMAL LOW (ref 3.87–5.11)
RDW: 15.5 % (ref 11.5–15.5)
WBC: 11.2 10*3/uL — ABNORMAL HIGH (ref 4.0–10.5)
nRBC: 1.3 % — ABNORMAL HIGH (ref 0.0–0.2)

## 2021-12-30 LAB — TYPE AND SCREEN
ABO/RH(D): O POS
Antibody Screen: POSITIVE
Donor AG Type: NEGATIVE
Donor AG Type: NEGATIVE
Unit division: 0
Unit division: 0

## 2021-12-30 LAB — BPAM RBC
Blood Product Expiration Date: 202311032359
Blood Product Expiration Date: 202311052359
Unit Type and Rh: 5100
Unit Type and Rh: 5100

## 2021-12-30 LAB — CULTURE, BETA STREP (GROUP B ONLY): Strep Gp B Culture: NEGATIVE

## 2021-12-30 SURGERY — Surgical Case
Anesthesia: Regional

## 2021-12-30 MED ORDER — ACETAMINOPHEN 500 MG PO TABS: 1000.0000 mg | ORAL_TABLET | Freq: Four times a day (QID) | ORAL | Status: DC

## 2021-12-30 MED ORDER — NALOXONE HCL 4 MG/10ML IJ SOLN: 1.0000 ug/kg/h | INTRAVENOUS | Status: DC | PRN

## 2021-12-30 MED ORDER — PHENYLEPHRINE 80 MCG/ML (10ML) SYRINGE FOR IV PUSH (FOR BLOOD PRESSURE SUPPORT): 80.0000 ug | PREFILLED_SYRINGE | INTRAVENOUS | Status: DC | PRN

## 2021-12-30 MED ORDER — DIPHENHYDRAMINE HCL 25 MG PO CAPS
25.0000 mg | ORAL_CAPSULE | Freq: Four times a day (QID) | ORAL | Status: DC | PRN
  Administered 2021-12-30: 25 mg via ORAL
  Filled 2021-12-30: qty 1

## 2021-12-30 MED ORDER — FENTANYL CITRATE (PF) 100 MCG/2ML IJ SOLN
INTRAMUSCULAR | Status: DC | PRN
  Administered 2021-12-30: 15 ug via INTRATHECAL

## 2021-12-30 MED ORDER — MORPHINE SULFATE (PF) 0.5 MG/ML IJ SOLN: INTRAMUSCULAR | Status: DC | PRN

## 2021-12-30 MED ORDER — FENTANYL CITRATE (PF) 100 MCG/2ML IJ SOLN
INTRAMUSCULAR | Status: AC
  Filled 2021-12-30: qty 2

## 2021-12-30 MED ORDER — PHENYLEPHRINE HCL-NACL 20-0.9 MG/250ML-% IV SOLN
INTRAVENOUS | Status: AC
  Filled 2021-12-30: qty 250

## 2021-12-30 MED ORDER — DIPHENHYDRAMINE HCL 50 MG/ML IJ SOLN
12.5000 mg | INTRAMUSCULAR | Status: DC | PRN
  Administered 2021-12-30 (×2): 12.5 mg via INTRAVENOUS
  Filled 2021-12-30: qty 1

## 2021-12-30 MED ORDER — EPHEDRINE 5 MG/ML INJ: 10.0000 mg | INTRAVENOUS | Status: DC | PRN

## 2021-12-30 MED ORDER — WITCH HAZEL-GLYCERIN EX PADS
1.0000 | MEDICATED_PAD | CUTANEOUS | Status: DC | PRN
  Administered 2021-12-31: 1 via TOPICAL

## 2021-12-30 MED ORDER — ONDANSETRON HCL 4 MG/2ML IJ SOLN
INTRAMUSCULAR | Status: AC
  Filled 2021-12-30: qty 2

## 2021-12-30 MED ORDER — FUROSEMIDE 20 MG PO TABS
20.0000 mg | ORAL_TABLET | Freq: Two times a day (BID) | ORAL | Status: DC
  Administered 2021-12-30 – 2022-01-02 (×6): 20 mg via ORAL
  Filled 2021-12-30 (×6): qty 1

## 2021-12-30 MED ORDER — TETANUS-DIPHTH-ACELL PERTUSSIS 5-2.5-18.5 LF-MCG/0.5 IM SUSY: 0.5000 mL | PREFILLED_SYRINGE | Freq: Once | INTRAMUSCULAR | Status: DC

## 2021-12-30 MED ORDER — CEFAZOLIN SODIUM-DEXTROSE 2-4 GM/100ML-% IV SOLN
2.0000 g | INTRAVENOUS | Status: AC
  Administered 2021-12-30: 2 g via INTRAVENOUS

## 2021-12-30 MED ORDER — DEXAMETHASONE SODIUM PHOSPHATE 10 MG/ML IJ SOLN
INTRAMUSCULAR | Status: DC | PRN
  Administered 2021-12-30: 10 mg via INTRAVENOUS

## 2021-12-30 MED ORDER — IBUPROFEN 600 MG PO TABS
600.0000 mg | ORAL_TABLET | Freq: Four times a day (QID) | ORAL | Status: DC
  Administered 2021-12-31 – 2022-01-02 (×7): 600 mg via ORAL
  Filled 2021-12-30 (×7): qty 1

## 2021-12-30 MED ORDER — COCONUT OIL OIL: 1.0000 | TOPICAL_OIL | Status: DC | PRN

## 2021-12-30 MED ORDER — SODIUM CHLORIDE 0.9 % IV SOLN
500.0000 mg | INTRAVENOUS | Status: AC
  Administered 2021-12-30: 500 mg via INTRAVENOUS
  Filled 2021-12-30: qty 5

## 2021-12-30 MED ORDER — OXYCODONE HCL 5 MG PO TABS
5.0000 mg | ORAL_TABLET | ORAL | Status: DC | PRN
  Administered 2021-12-31: 10 mg via ORAL
  Administered 2021-12-31 (×2): 5 mg via ORAL
  Administered 2022-01-01 – 2022-01-02 (×5): 10 mg via ORAL
  Filled 2021-12-30: qty 1
  Filled 2021-12-30 (×5): qty 2
  Filled 2021-12-30: qty 1
  Filled 2021-12-30: qty 2

## 2021-12-30 MED ORDER — FUROSEMIDE 10 MG/ML IJ SOLN
INTRAMUSCULAR | Status: DC | PRN
  Administered 2021-12-30: 10 mg via INTRAMUSCULAR

## 2021-12-30 MED ORDER — LACTATED RINGERS IV SOLN: 500.0000 mL | Freq: Once | INTRAVENOUS | Status: DC

## 2021-12-30 MED ORDER — ONDANSETRON HCL 4 MG/2ML IJ SOLN
INTRAMUSCULAR | Status: DC | PRN
  Administered 2021-12-30: 4 mg via INTRAVENOUS

## 2021-12-30 MED ORDER — OXYTOCIN-SODIUM CHLORIDE 30-0.9 UT/500ML-% IV SOLN
INTRAVENOUS | Status: AC
  Filled 2021-12-30: qty 500

## 2021-12-30 MED ORDER — SODIUM CHLORIDE 0.9% FLUSH: 3.0000 mL | INTRAVENOUS | Status: DC | PRN

## 2021-12-30 MED ORDER — ZOLPIDEM TARTRATE 5 MG PO TABS: 5.0000 mg | ORAL_TABLET | Freq: Every evening | ORAL | Status: DC | PRN

## 2021-12-30 MED ORDER — PRENATAL MULTIVITAMIN CH
1.0000 | ORAL_TABLET | Freq: Every day | ORAL | Status: DC
  Administered 2021-12-31 – 2022-01-02 (×3): 1 via ORAL
  Filled 2021-12-30 (×3): qty 1

## 2021-12-30 MED ORDER — KETOROLAC TROMETHAMINE 30 MG/ML IJ SOLN
INTRAMUSCULAR | Status: DC | PRN
  Administered 2021-12-30: 30 mg via INTRAVENOUS

## 2021-12-30 MED ORDER — OXYTOCIN-SODIUM CHLORIDE 30-0.9 UT/500ML-% IV SOLN
INTRAVENOUS | Status: DC | PRN
  Administered 2021-12-30: 300 mL via INTRAVENOUS

## 2021-12-30 MED ORDER — SODIUM CHLORIDE 0.9 % IR SOLN
Status: DC | PRN
  Administered 2021-12-30: 1000 mL

## 2021-12-30 MED ORDER — FUROSEMIDE 10 MG/ML IJ SOLN
INTRAMUSCULAR | Status: AC
  Filled 2021-12-30: qty 2

## 2021-12-30 MED ORDER — ACETAMINOPHEN 500 MG PO TABS
1000.0000 mg | ORAL_TABLET | Freq: Four times a day (QID) | ORAL | Status: DC
  Administered 2021-12-30 – 2022-01-02 (×11): 1000 mg via ORAL
  Filled 2021-12-30 (×12): qty 2

## 2021-12-30 MED ORDER — MORPHINE SULFATE (PF) 0.5 MG/ML IJ SOLN
INTRAMUSCULAR | Status: AC
  Filled 2021-12-30: qty 10

## 2021-12-30 MED ORDER — MORPHINE SULFATE (PF) 0.5 MG/ML IJ SOLN
INTRAMUSCULAR | Status: DC | PRN
  Administered 2021-12-30: 150 ug via INTRATHECAL

## 2021-12-30 MED ORDER — STERILE WATER FOR IRRIGATION IR SOLN
Status: DC | PRN
  Administered 2021-12-30: 1000 mL

## 2021-12-30 MED ORDER — NIFEDIPINE ER OSMOTIC RELEASE 30 MG PO TB24
30.0000 mg | ORAL_TABLET | Freq: Every day | ORAL | Status: DC
  Administered 2021-12-30 – 2022-01-01 (×3): 30 mg via ORAL
  Filled 2021-12-30 (×3): qty 1

## 2021-12-30 MED ORDER — DEXAMETHASONE SODIUM PHOSPHATE 10 MG/ML IJ SOLN
INTRAMUSCULAR | Status: AC
  Filled 2021-12-30: qty 1

## 2021-12-30 MED ORDER — SALINE SPRAY 0.65 % NA SOLN
1.0000 | NASAL | Status: DC | PRN
  Filled 2021-12-30: qty 44

## 2021-12-30 MED ORDER — BUPIVACAINE IN DEXTROSE 0.75-8.25 % IT SOLN
INTRATHECAL | Status: DC | PRN
  Administered 2021-12-30: 1.6 mL via INTRATHECAL

## 2021-12-30 MED ORDER — MENTHOL 3 MG MT LOZG: 1.0000 | LOZENGE | OROMUCOSAL | Status: DC | PRN

## 2021-12-30 MED ORDER — NALOXONE HCL 0.4 MG/ML IJ SOLN: 0.4000 mg | INTRAMUSCULAR | Status: DC | PRN

## 2021-12-30 MED ORDER — POVIDONE-IODINE 10 % EX SWAB: 2.0000 | Freq: Once | CUTANEOUS | Status: DC

## 2021-12-30 MED ORDER — DIPHENHYDRAMINE HCL 25 MG PO CAPS: 25.0000 mg | ORAL_CAPSULE | ORAL | Status: DC | PRN

## 2021-12-30 MED ORDER — ONDANSETRON HCL 4 MG/2ML IJ SOLN: 4.0000 mg | Freq: Three times a day (TID) | INTRAMUSCULAR | Status: DC | PRN

## 2021-12-30 MED ORDER — SENNOSIDES-DOCUSATE SODIUM 8.6-50 MG PO TABS
2.0000 | ORAL_TABLET | Freq: Every day | ORAL | Status: DC
  Administered 2021-12-31 – 2022-01-02 (×3): 2 via ORAL
  Filled 2021-12-30 (×3): qty 2

## 2021-12-30 MED ORDER — PHENYLEPHRINE HCL-NACL 20-0.9 MG/250ML-% IV SOLN
INTRAVENOUS | Status: DC | PRN
  Administered 2021-12-30: 60 ug/min via INTRAVENOUS

## 2021-12-30 MED ORDER — SODIUM CHLORIDE 0.9 % IV SOLN
INTRAVENOUS | Status: AC
  Filled 2021-12-30: qty 5

## 2021-12-30 MED ORDER — DIPHENHYDRAMINE HCL 50 MG/ML IJ SOLN: 12.5000 mg | INTRAMUSCULAR | Status: DC | PRN

## 2021-12-30 MED ORDER — FENTANYL-BUPIVACAINE-NACL 0.5-0.125-0.9 MG/250ML-% EP SOLN: 12.0000 mL/h | EPIDURAL | Status: DC | PRN

## 2021-12-30 MED ORDER — LACTATED RINGERS IV SOLN: INTRAVENOUS | Status: DC

## 2021-12-30 MED ORDER — MAGNESIUM HYDROXIDE 400 MG/5ML PO SUSP: 30.0000 mL | ORAL | Status: DC | PRN

## 2021-12-30 MED ORDER — OXYTOCIN-SODIUM CHLORIDE 30-0.9 UT/500ML-% IV SOLN
2.5000 [IU]/h | INTRAVENOUS | Status: AC
  Administered 2021-12-30: 2.5 [IU]/h via INTRAVENOUS

## 2021-12-30 MED ORDER — KETOROLAC TROMETHAMINE 30 MG/ML IJ SOLN
INTRAMUSCULAR | Status: AC
  Filled 2021-12-30: qty 1

## 2021-12-30 MED ORDER — GABAPENTIN 100 MG PO CAPS
200.0000 mg | ORAL_CAPSULE | Freq: Every day | ORAL | Status: DC
  Administered 2021-12-30 – 2022-01-01 (×3): 200 mg via ORAL
  Filled 2021-12-30 (×5): qty 2

## 2021-12-30 MED ORDER — MEASLES, MUMPS & RUBELLA VAC IJ SOLR: 0.5000 mL | Freq: Once | INTRAMUSCULAR | Status: DC

## 2021-12-30 MED ORDER — ENOXAPARIN SODIUM 60 MG/0.6ML IJ SOSY
50.0000 mg | PREFILLED_SYRINGE | INTRAMUSCULAR | Status: DC
  Administered 2021-12-31 – 2022-01-02 (×3): 50 mg via SUBCUTANEOUS
  Filled 2021-12-30 (×3): qty 0.6

## 2021-12-30 MED ORDER — SIMETHICONE 80 MG PO CHEW
80.0000 mg | CHEWABLE_TABLET | ORAL | Status: DC | PRN
  Administered 2022-01-01: 80 mg via ORAL
  Filled 2021-12-30: qty 1

## 2021-12-30 MED ORDER — SIMETHICONE 80 MG PO CHEW
80.0000 mg | CHEWABLE_TABLET | Freq: Three times a day (TID) | ORAL | Status: DC
  Administered 2021-12-30 – 2022-01-02 (×8): 80 mg via ORAL
  Filled 2021-12-30 (×7): qty 1

## 2021-12-30 MED ORDER — DIBUCAINE (PERIANAL) 1 % EX OINT
1.0000 | TOPICAL_OINTMENT | CUTANEOUS | Status: DC | PRN
  Administered 2021-12-31: 1 via RECTAL
  Filled 2021-12-30: qty 28

## 2021-12-30 MED ORDER — KETOROLAC TROMETHAMINE 30 MG/ML IJ SOLN
30.0000 mg | Freq: Once | INTRAMUSCULAR | Status: AC
  Administered 2021-12-30: 30 mg via INTRAVENOUS

## 2021-12-30 MED ORDER — FENTANYL CITRATE (PF) 100 MCG/2ML IJ SOLN
INTRAMUSCULAR | Status: DC | PRN
  Administered 2021-12-30: 50 ug via INTRAVENOUS
  Administered 2021-12-30: 35 ug via INTRAVENOUS

## 2021-12-30 MED ORDER — ALBUTEROL SULFATE (2.5 MG/3ML) 0.083% IN NEBU
3.0000 mL | INHALATION_SOLUTION | RESPIRATORY_TRACT | Status: DC
  Administered 2021-12-30 – 2021-12-31 (×3): 3 mL via RESPIRATORY_TRACT
  Filled 2021-12-30 (×3): qty 3

## 2021-12-30 MED ORDER — ACETAMINOPHEN 10 MG/ML IV SOLN
INTRAVENOUS | Status: DC | PRN
  Administered 2021-12-30: 1000 mg via INTRAVENOUS

## 2021-12-30 MED ORDER — KETOROLAC TROMETHAMINE 30 MG/ML IJ SOLN
30.0000 mg | Freq: Four times a day (QID) | INTRAMUSCULAR | Status: AC
  Administered 2021-12-30 – 2021-12-31 (×4): 30 mg via INTRAVENOUS
  Filled 2021-12-30 (×4): qty 1

## 2021-12-30 SURGICAL SUPPLY — 41 items
BENZOIN TINCTURE PRP APPL 2/3 (GAUZE/BANDAGES/DRESSINGS) ×1 IMPLANT
CHLORAPREP W/TINT 26ML (MISCELLANEOUS) ×2 IMPLANT
CLAMP UMBILICAL CORD (MISCELLANEOUS) ×1 IMPLANT
CLOTH BEACON ORANGE TIMEOUT ST (SAFETY) ×1 IMPLANT
DRAPE C SECTION CLR SCREEN (DRAPES) IMPLANT
DRSG OPSITE POSTOP 4X10 (GAUZE/BANDAGES/DRESSINGS) ×1 IMPLANT
ELECT REM PT RETURN 9FT ADLT (ELECTROSURGICAL) ×1
ELECTRODE REM PT RTRN 9FT ADLT (ELECTROSURGICAL) ×1 IMPLANT
EXTRACTOR VACUUM M CUP 4 TUBE (SUCTIONS) IMPLANT
GAUZE SPONGE 4X4 12PLY STRL LF (GAUZE/BANDAGES/DRESSINGS) IMPLANT
GLOVE BIO SURGEON STRL SZ7.5 (GLOVE) ×1 IMPLANT
GLOVE BIOGEL PI IND STRL 7.0 (GLOVE) ×2 IMPLANT
GOWN STRL REUS W/TWL 2XL LVL3 (GOWN DISPOSABLE) ×1 IMPLANT
GOWN STRL REUS W/TWL LRG LVL3 (GOWN DISPOSABLE) ×2 IMPLANT
KIT ABG SYR 3ML LUER SLIP (SYRINGE) IMPLANT
MAT PREVALON FULL STRYKER (MISCELLANEOUS) IMPLANT
NDL HYPO 25X5/8 SAFETYGLIDE (NEEDLE) IMPLANT
NEEDLE HYPO 22GX1.5 SAFETY (NEEDLE) ×1 IMPLANT
NEEDLE HYPO 25X5/8 SAFETYGLIDE (NEEDLE) IMPLANT
NS IRRIG 1000ML POUR BTL (IV SOLUTION) ×1 IMPLANT
PACK C SECTION WH (CUSTOM PROCEDURE TRAY) ×1 IMPLANT
PAD ABD 7.5X8 STRL (GAUZE/BANDAGES/DRESSINGS) IMPLANT
PAD OB MATERNITY 4.3X12.25 (PERSONAL CARE ITEMS) ×1 IMPLANT
RETRACTOR TRAXI PANNICULUS (MISCELLANEOUS) IMPLANT
RTRCTR C-SECT PINK 25CM LRG (MISCELLANEOUS) ×1 IMPLANT
STRIP CLOSURE SKIN 1/2X4 (GAUZE/BANDAGES/DRESSINGS) ×1 IMPLANT
SUT CHROMIC 1 CTX 36 (SUTURE) ×2 IMPLANT
SUT PLAIN 0 NONE (SUTURE) IMPLANT
SUT VIC AB 1 CT1 36 (SUTURE) ×1 IMPLANT
SUT VIC AB 2-0 CT1 (SUTURE) ×1 IMPLANT
SUT VIC AB 2-0 CT1 27 (SUTURE) ×1
SUT VIC AB 2-0 CT1 TAPERPNT 27 (SUTURE) ×1 IMPLANT
SUT VIC AB 3-0 CT1 27 (SUTURE) ×1
SUT VIC AB 3-0 CT1 TAPERPNT 27 (SUTURE) ×1 IMPLANT
SUT VIC AB 3-0 SH 27 (SUTURE)
SUT VIC AB 3-0 SH 27X BRD (SUTURE) IMPLANT
SUT VIC AB 4-0 KS 27 (SUTURE) ×1 IMPLANT
SYR BULB IRRIGATION 50ML (SYRINGE) IMPLANT
TOWEL OR 17X24 6PK STRL BLUE (TOWEL DISPOSABLE) ×1 IMPLANT
TRAY FOLEY W/BAG SLVR 14FR LF (SET/KITS/TRAYS/PACK) ×1 IMPLANT
WATER STERILE IRR 1000ML POUR (IV SOLUTION) ×1 IMPLANT

## 2021-12-30 NOTE — Progress Notes (Signed)
Asked to come to the patient's room to discuss c-section. The patient states she no longer wants to try for a vaginal birth.  The risks of surgery were discussed with the patient including but were not limited to: bleeding which may require transfusion or reoperation; infection which may require antibiotics; injury to bowel, bladder, ureters or other surrounding organs; injury to the fetus; need for additional procedures including hysterectomy in the event of a life-threatening hemorrhage; formation of adhesions; placental abnormalities wth subsequent pregnancies; incisional problems; thromboembolic phenomenon and other postoperative/anesthesia complications.   Pitocin turned off  The patient concurred with the proposed plan, giving informed written consent for the procedures. To OR when ready.

## 2021-12-30 NOTE — Transfer of Care (Signed)
Immediate Anesthesia Transfer of Care Note  Patient: Kimberly Stark  Procedure(s) Performed: CESAREAN SECTION  Patient Location: PACU  Anesthesia Type:Spinal  Level of Consciousness: awake, alert  and oriented  Airway & Oxygen Therapy: Patient Spontanous Breathing  Post-op Assessment: Report given to RN and Post -op Vital signs reviewed and stable  Post vital signs: Reviewed and stable  Last Vitals:  Vitals Value Taken Time  BP 125/68 12/30/21 1248  Temp 36.6 C 12/30/21 1249  Pulse 78 12/30/21 1250  Resp 26 12/30/21 1250  SpO2 96 % 12/30/21 1250  Vitals shown include unvalidated device data.  Last Pain:  Vitals:   12/30/21 1249  TempSrc: Oral  PainSc:          Complications: No notable events documented.

## 2021-12-30 NOTE — Anesthesia Postprocedure Evaluation (Signed)
Anesthesia Post Note  Patient: Kimberly Stark  Procedure(s) Performed: CESAREAN SECTION     Patient location during evaluation: PACU Anesthesia Type: Spinal Level of consciousness: awake and alert Pain management: pain level controlled Vital Signs Assessment: post-procedure vital signs reviewed and stable Respiratory status: spontaneous breathing, nonlabored ventilation and respiratory function stable Cardiovascular status: blood pressure returned to baseline Postop Assessment: no apparent nausea or vomiting, spinal receding, no headache and no backache Anesthetic complications: no   No notable events documented.  Last Vitals:  Vitals:   12/30/21 1350 12/30/21 1405  BP:  136/63  Pulse: 80 87  Resp: 15   Temp:  36.8 C  SpO2: 98% 99%    Last Pain:  Vitals:   12/30/21 1350  TempSrc:   PainSc: 6    Pain Goal:                   Marthenia Rolling

## 2021-12-30 NOTE — Anesthesia Preprocedure Evaluation (Addendum)
Anesthesia Evaluation  Patient identified by MRN, date of birth, ID band Patient awake    Reviewed: Allergy & Precautions, NPO status , Patient's Chart, lab work & pertinent test results, reviewed documented beta blocker date and time   History of Anesthesia Complications Negative for: history of anesthetic complications  Airway Mallampati: II  TM Distance: >3 FB Neck ROM: Full    Dental no notable dental hx.    Pulmonary asthma , Current Smoker, PE (2021, on lovenox (last dose 12/27/21))   Pulmonary exam normal        Cardiovascular hypertension, Pt. on medications and Pt. on home beta blockers Normal cardiovascular exam     Neuro/Psych  Headaches, Anxiety Bipolar Disorder Chiari malformation    GI/Hepatic Neg liver ROS, GERD  Medicated,  Endo/Other  negative endocrine ROS  Renal/GU negative Renal ROS  negative genitourinary   Musculoskeletal negative musculoskeletal ROS (+)   Abdominal   Peds  Hematology  (+) Blood dyscrasia (Hgb 8.9), anemia ,   Anesthesia Other Findings Day of surgery medications reviewed with patient.  Reproductive/Obstetrics negative OB ROS                           Anesthesia Physical Anesthesia Plan  ASA: 3  Anesthesia Plan: Spinal   Post-op Pain Management:    Induction:   PONV Risk Score and Plan: Treatment may vary due to age or medical condition, Ondansetron and Dexamethasone  Airway Management Planned: Natural Airway  Additional Equipment:   Intra-op Plan:   Post-operative Plan:   Informed Consent: I have reviewed the patients History and Physical, chart, labs and discussed the procedure including the risks, benefits and alternatives for the proposed anesthesia with the patient or authorized representative who has indicated his/her understanding and acceptance.       Plan Discussed with: CRNA  Anesthesia Plan Comments:         Anesthesia Quick Evaluation

## 2021-12-30 NOTE — Anesthesia Procedure Notes (Signed)
Spinal  Patient location during procedure: OR Start time: 12/30/2021 11:19 AM End time: 12/30/2021 11:22 AM Reason for block: surgical anesthesia Staffing Performed: anesthesiologist  Anesthesiologist: Brennan Bailey, MD Performed by: Brennan Bailey, MD Authorized by: Brennan Bailey, MD   Preanesthetic Checklist Completed: patient identified, IV checked, risks and benefits discussed, monitors and equipment checked, pre-op evaluation and timeout performed Spinal Block Patient position: sitting Prep: DuraPrep and site prepped and draped Patient monitoring: heart rate, continuous pulse ox and blood pressure Approach: midline Location: L3-4 Injection technique: single-shot Needle Needle type: Pencan  Needle gauge: 24 G Needle length: 10 cm Assessment Sensory level: T4 Events: CSF return Additional Notes Risks, benefits, and alternative discussed. Patient gave consent to procedure. Prepped and draped in sitting position. Clear CSF obtained after 2 needle redirections. Positive terminal aspiration. No pain or paraesthesias with injection. Patient tolerated procedure well. Vital signs stable. Tawny Asal, MD

## 2021-12-30 NOTE — Op Note (Signed)
[name] PROCEDURE DATE: 12/30/2021  PREOPERATIVE DIAGNOSES: Intrauterine pregnancy at [redacted]w[redacted]d weeks gestation; failure to progress: arrest of descent and failure to progress: arrest of dilation  POSTOPERATIVE DIAGNOSES: The same, viable infant delivered  PROCEDURE: RepeatLow Transverse Cesarean Section  SURGEON:  Dr. Arlina Robes  ASSISTANT:  Shelda Pal, DO An experienced assistant was required given the standard of surgical care given the complexity of the case.  This assistant was needed for exposure, dissection, suctioning, retraction, instrument exchange, assisting with delivery with administration of fundal pressure, and for overall help during the procedure.  ANESTHESIOLOGY TEAM: Anesthesiologist: Brennan Bailey, MD CRNA: Hewitt Blade, CRNA; Rhymer, Waynard Reeds, CRNA Student Nurse Anesthetist: Carin Hock, RN  INDICATIONS: Kimberly Stark is a 31 y.o. [id number] at [redacted]w[redacted]d here for cesarean section secondary to the indications listed under preoperative diagnoses; please see preoperative note for further details.  The risks of surgery were discussed with the patient including but were not limited to: bleeding which may require transfusion or reoperation; infection which may require antibiotics; injury to bowel, bladder, ureters or other surrounding organs; injury to the fetus; need for additional procedures including hysterectomy in the event of a life-threatening hemorrhage; formation of adhesions; placental abnormalities wth subsequent pregnancies; incisional problems; thromboembolic phenomenon and other postoperative/anesthesia complications.  The patient concurred with the proposed plan, giving informed written consent for the procedure.    FINDINGS:  Viable female infant in cephalic presentation.  Apgars 9 and 9.  Amniotic fluid: clear.  Intact placenta, three vessel cord.  Normal uterus, fallopian tubes and ovaries bilaterally.  ANESTHESIA:  spinal INTRAVENOUS FLUIDS: 1300 ml   ESTIMATED BLOOD LOSS: 461 ml URINE OUTPUT:  450 ml SPECIMENS: Placenta sent to parents per request . COMPLICATIONS: None immediate  PROCEDURE IN DETAIL:  The patient preoperatively received intravenous antibiotics and had sequential compression devices applied to her lower extremities.  She was then taken to the operating room where spinal anesthesia was found to be adequate. She was then placed in a dorsal supine position with a leftward tilt, and prepped and draped in a sterile manner.  A foley catheter was  placed into her bladder and attached to constant gravity.  After an adequate timeout was performed, a Pfannenstiel skin incision was made with scalpel and carried through to the underlying layer of fascia. The fascia was incised in the midline, and this incision was extended sharply with mayo scissors. The rectus muscles were separated in the midline and the peritoneum was entered bluntly.   The Alexis self-retaining retractor was introduced into the abdominal cavity.  Attention was turned to the lower uterine segment where a low transverse hysterotomy was made with a scalpel and extended bluntly in caudad and cephalad directions.  The infant was successfully delivered, the cord was clamped and cut after one minute, and the infant was handed over to the awaiting neonatology team. Uterine massage was then administered, and the placenta delivered intact with a three-vessel cord. The uterus was then cleared of clots and debris.  The hysterotomy was closed with 0-Chromic in a running fashion.  Figure-of-eight 0 chromic serosal stitches were placed to help with hemostasis.    The pelvis was cleared of all clot and debris. Hemostasis was confirmed on all surfaces.  The uterus was once again inspected and found to be hemostatic. The retractor was removed.  The peritoneum was closed with a 2-0 Vicryl running stitch. The fascia was then closed using 1 Vicryl in a  running fashion.  The  subcutaneous layer was irrigated, any areas of bleeding were cauterized with the bovie,  was reapproximated with 1-0 plain gut in a running fashion, was found to be hemostatic.. . The skin was closed with a 4-0 Vicryl subcuticular stitch. The patient tolerated the procedure well. Sponge, instrument and needle counts were correct x 3.  She was taken to the recovery room in stable condition.   Shelda Pal, [place] Fellow, Faculty practice [place] for [facility] 12/30/21  2:32 PM

## 2021-12-30 NOTE — Progress Notes (Signed)
Kimberly Stark is a 31 y.o. [id number] at [redacted]w[redacted]d by ultrasound admitted for induction of labor due to worsening cHTN. TOLAC following arrest of descent with a 9lb baby. Current pregnancy EFW 45%.   Subjective: No acute concerns.  Objective: BP (!) 145/78   Pulse 79   Temp 98.1 F (36.7 C) (Oral)   Resp 15   Ht 5\' 4"  (1.626 m)   Wt 102.4 kg   LMP 03/22/2021   Breastfeeding Yes   BMI 38.76 kg/m  No intake/output data recorded. No intake/output data recorded.  FHT:  FHR: 115 bpm, variability: moderate,  accelerations:  Present,  decelerations:  Absent  SVE:   Dilation: 4 Effacement (%): 60 Station: Ballotable Exam by:: S Autrey-Lott, DO  Labs: Lab Results  Component Value Date   WBC 9.5 12/28/2021   HGB 9.7 (L) 12/28/2021   HCT 30.9 (L) 12/28/2021   MCV 79.2 (L) 12/28/2021   PLT 410 (H) 12/28/2021   Patient Vitals for the past 24 hrs:  BP Temp Temp src Pulse Resp  12/30/21 0501 (!) 145/78 -- -- 79 --  12/30/21 0430 (!) 129/57 -- -- 91 --  12/30/21 0331 136/83 -- -- 78 15  12/30/21 0202 (!) 154/87 -- -- 77 --  12/30/21 0101 (!) 147/81 -- -- 74 15  12/30/21 0041 133/67 -- -- 76 --  12/30/21 0038 (!) 128/58 -- -- 78 --  12/29/21 2301 132/60 98.1 F (36.7 C) Oral 86 15  12/29/21 2104 133/81 -- -- 98 15  12/29/21 2001 123/68 -- -- 76 15  12/29/21 1912 (!) 144/82 98 F (36.7 C) Oral 77 17  12/29/21 1851 (!) 164/80 -- -- 90 --  12/29/21 1803 (!) 157/69 -- -- 85 --  12/29/21 1750 138/74 -- -- 85 --  12/29/21 1545 120/80 98.4 F (36.9 C) Oral 91 17  12/29/21 1437 (!) 140/72 -- -- 87 18  12/29/21 1338 (!) 152/83 -- -- 84 18  12/29/21 1301 (!) 145/79 -- -- 89 18  12/29/21 1231 130/77 -- -- 83 20  12/29/21 1212 -- 98.6 F (37 C) Oral -- --  12/29/21 1202 135/71 -- -- 84 20  12/29/21 1131 (!) 143/74 -- -- 87 20  12/29/21 1108 (!) 145/78 -- -- 89 17  12/29/21 1030 138/70 -- -- 85 20  12/29/21 1001 133/69 -- -- 85 18  12/29/21 0932 138/65 -- -- 90 17  12/29/21 0903 (!)  147/69 -- -- 91 18  12/29/21 0830 138/75 -- -- 86 18  12/29/21 0740 125/60 98 F (36.7 C) Oral 80 17  12/29/21 0702 132/68 -- -- 85 16  12/29/21 0615 (!) 144/85 -- -- (!) 103 15      Assessment / Plan: Induction of labor due to induction of labor due to worsening cHTN,  s/p FB, AROM, pit break, FSE, IUPC.    Labor: Plan to restart pitocin. [place] scan confirmed vertex. Preeclampsia:  labs stable Fetal Wellbeing:  Category I- continuously monitoring  Pain Control:  Labor support without medications I/D:   GBS neg  Anticipated MOD:  NSVD  Dhana Totton Autry-Lott, DO 12/30/2021, 6:07 AM

## 2021-12-30 NOTE — Discharge Summary (Signed)
Postpartum Discharge Summary  Date of Service updated***     Patient Name: Kimberly Stark DOB: September 23, 1990 MRN: [id number]  Date of admission: 12/28/2021 Delivery date:  Delivering provider:   Date of discharge: 12/30/2021  Admitting diagnosis: Chronic hypertension affecting pregnancy [O10.919] Intrauterine pregnancy: [redacted]w[redacted]d    Secondary diagnosis:  Principal Problem:   Delivered by cesarean delivery following previous cesarean delivery Active Problems:   History of HELLP syndrome, currently pregnant, third trimester   Supervision of high risk pregnancy, antepartum   History of pulmonary embolus (PE)   History of C-section   Obesity complicating pregnancy in third trimester   Arnold-Chiari malformation (HCC)   Iron deficiency anemia   Chronic hypertension affecting pregnancy  Additional problems: ***    Discharge diagnosis: Term Pregnancy Delivered and CHTN                                              Post partum procedures:{Postpartum procedures:23558} Augmentation: Pitocin and IP Foley Complications: {OB Labor/Delivery Complications:20784}  Hospital course: Induction of Labor With Cesarean Section   31y.o. yo [id number] admitted to the hospital 12/28/2021 for induction of labor. Patient had a labor course significant for induction, augmentation and eventual arrest of descent. The patient went for cesarean section due to Arrest of Descent. Delivery details are as follows: Membrane Rupture Time/Date: 12:02 AM ,12/30/2021   Delivery Method:  Details of operation can be found in separate operative Note.  Patient had a postpartum course complicated by***. She is ambulating, tolerating a regular diet, passing flatus, and urinating well.  Patient is discharged home in stable condition on 12/30/21.      Newborn Data: Birth date:  Birth time:  Gender:  Living status:  Apgars: ,  Weight:                                Magnesium Sulfate received: {Mag  received:30440022} BMZ received: No Rhophylac:N/A MMR:N/A T-DaP:Given prenatally Flu: Yes Transfusion:{Transfusion received:30440034}  Physical exam  Vitals:   12/30/21 0501 12/30/21 0600 12/30/21 0724 12/30/21 0800  BP: (!) 145/78 (!) 145/65 136/64 (!) 144/81  Pulse: 79 85 90 93  Resp:   18 18  Temp:   98.1 F (36.7 C)   TempSrc:   Oral   Weight:      Height:       General: {Exam; general:21111117} Lochia: {Desc; appropriate/inappropriate:30686::"appropriate"} Uterine Fundus: {Desc; firm/soft:30687} Incision: {Exam; incision:21111123} DVT Evaluation: {Exam; dvt:2111122} Labs: Lab Results  Component Value Date   WBC 11.2 (H) 12/30/2021   HGB 8.9 (L) 12/30/2021   HCT 29.3 (L) 12/30/2021   MCV 80.5 12/30/2021   PLT 387 12/30/2021      Latest Ref Rng & Units 12/28/2021   10:09 AM  CMP  Glucose 70 - 99 mg/dL 94   BUN 6 - 20 mg/dL <5   Creatinine 0.44 - 1.00 mg/dL 0.54   Sodium 135 - 145 mmol/L 136   Potassium 3.5 - 5.1 mmol/L 3.8   Chloride 98 - 111 mmol/L 108   CO2 22 - 32 mmol/L 21   Calcium 8.9 - 10.3 mg/dL 9.4   Total Protein 6.5 - 8.1 g/dL 5.7   Total Bilirubin 0.3 - 1.2 mg/dL 0.2   Alkaline Phos 38 - 126 U/L 161  AST 15 - 41 U/L 17   ALT 0 - 44 U/L 8    Edinburgh Score:     No data to display           After visit meds:  Allergies as of 12/30/2021       Reactions   Iodinated Contrast Media Shortness Of Breath, Rash, Swelling   Latex Anaphylaxis   Shellfish Allergy Anaphylaxis   Iodine Hives   Penicillins Hives     Med Rec must be completed prior to using this [place]***        Discharge home in stable condition Infant Feeding: {Baby feeding:23562} Infant Disposition:{CHL IP OB HOME WITH GZFPOI:51898} Discharge instruction: per After Visit Summary and Postpartum booklet. Activity: Advance as tolerated. Pelvic rest for 6 weeks.  Diet: {OB MKJI:31281188} Future Appointments:No future appointments.  Follow up Visit: [X]  Message  sent to [facility] 10/20  Please schedule this patient for a In person postpartum visit in 6 weeks with the following provider: Any provider. Additional Postpartum F/U:Incision check 1 week and BP check 1 week  High risk pregnancy complicated by: HTN and Bleeding disorder on Lovenox Delivery mode:   rLTCS Anticipated Birth Control:   declined  12/30/2021 Shelda Pal, DO

## 2021-12-30 NOTE — Lactation Note (Signed)
This note was copied from a baby's chart. Lactation Consultation Note  Patient Name: Kimberly Stark Date: 12/30/2021   Age:33 hours Per RN, Joelene Millin) Birth Parent would like to be seen by [facility]  at next feeding around 0100 pm. Maternal Data    Feeding    LATCH Score Latch: Repeated attempts needed to sustain latch, nipple held in mouth throughout feeding, stimulation needed to elicit sucking reflex.  Audible Swallowing: A few with stimulation  Type of Nipple: Everted at rest and after stimulation  Comfort (Breast/Nipple): Soft / non-tender  Hold (Positioning): Assistance needed to correctly position infant at breast and maintain latch.  LATCH Score: 7   Lactation Tools Discussed/Used    Interventions    Discharge    Consult Status      Kimberly Stark 12/30/2021, 11:50 PM

## 2021-12-31 ENCOUNTER — Encounter (HOSPITAL_COMMUNITY): Payer: Self-pay | Admitting: Obstetrics and Gynecology

## 2021-12-31 LAB — CBC
HCT: 23.2 % — ABNORMAL LOW (ref 36.0–46.0)
Hemoglobin: 7.4 g/dL — ABNORMAL LOW (ref 12.0–15.0)
MCH: 25 pg — ABNORMAL LOW (ref 26.0–34.0)
MCHC: 31.9 g/dL (ref 30.0–36.0)
MCV: 78.4 fL — ABNORMAL LOW (ref 80.0–100.0)
Platelets: 397 10*3/uL (ref 150–400)
RBC: 2.96 MIL/uL — ABNORMAL LOW (ref 3.87–5.11)
RDW: 15.6 % — ABNORMAL HIGH (ref 11.5–15.5)
WBC: 14.7 10*3/uL — ABNORMAL HIGH (ref 4.0–10.5)
nRBC: 1.5 % — ABNORMAL HIGH (ref 0.0–0.2)

## 2021-12-31 LAB — BASIC METABOLIC PANEL
Anion gap: 6 (ref 5–15)
BUN: <5 mg/dL — ABNORMAL LOW (ref 6–20)
CO2: 22 mmol/L (ref 22–32)
Calcium: 8.3 mg/dL — ABNORMAL LOW (ref 8.9–10.3)
Chloride: 107 mmol/L (ref 98–111)
Creatinine, Ser: 0.72 mg/dL (ref 0.44–1.00)
GFR, Estimated: >60 mL/min (ref >60)
Glucose, Bld: 146 mg/dL — ABNORMAL HIGH (ref 70–99)
Potassium: 3.3 mmol/L — ABNORMAL LOW (ref 3.5–5.1)
Sodium: 135 mmol/L (ref 135–145)

## 2021-12-31 MED ORDER — FERROUS SULFATE 325 (65 FE) MG PO TABS: 325.0000 mg | ORAL_TABLET | Freq: Every day | ORAL | Status: DC

## 2021-12-31 MED ORDER — SODIUM CHLORIDE 0.9 % IV SOLN
500.0000 mg | Freq: Once | INTRAVENOUS | Status: AC
  Administered 2021-12-31: 500 mg via INTRAVENOUS
  Filled 2021-12-31: qty 500

## 2021-12-31 MED ORDER — ALBUTEROL SULFATE (2.5 MG/3ML) 0.083% IN NEBU: 3.0000 mL | INHALATION_SOLUTION | RESPIRATORY_TRACT | Status: DC | PRN
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
## 2021-12-31 NOTE — Lactation Note (Signed)
This note was copied from a baby's chart. Lactation Consultation Note  Patient Name: Kimberly Stark [id number] Date: 12/31/2021 Reason for consult: Follow-up assessment;Early term 37-38.6wks;Infant weight loss (-3% weight loss, C/S delivery, infant is breastfeed and supplemented with formula.) Age:33 hours Birth Parent latched infant on her right breast using the football hold position, infant was fussy and coming off breast ,infant was supplemented at breast initially with 0.5 mls of formula and afterwards sustained latch with depth and BF for 20 minutes.  After latching at the breast Suport Person gave infant 15 mls of formula with Advent bottle  pace feeding infant while Birth Parent rested.  Birth Parent will continue to BF infant by cues on demand, 8 to 12+ times within 24 hours, STS. Birth Parent knows to call [facility] for further latch assistance if needed.  Maternal Data    Feeding Mother's Current Feeding Choice: Breast Milk and Formula  LATCH Score Latch: Grasps breast easily, tongue down, lips flanged, rhythmical sucking.  Audible Swallowing: Spontaneous and intermittent  Type of Nipple: Everted at rest and after stimulation  Comfort (Breast/Nipple): Soft / non-tender  Hold (Positioning): Assistance needed to correctly position infant at breast and maintain latch.  LATCH Score: 9   Lactation Tools Discussed/Used    Interventions Interventions: Skin to skin;Assisted with latch;Breast compression;Adjust position;Support pillows;Position options;Education  Discharge    Consult Status Consult Status: Follow-up Date: 01/01/22 Follow-up type: In-patient    Vicente Serene 12/31/2021, 5:57 PM

## 2021-12-31 NOTE — Progress Notes (Signed)
Pt wanted her Doula to stay over night along with her support person. Floor RN explained our visitor policy that only 1 person is allowed to stay over night. Pt said that a previous RN told them that she could stay. Pt also explained that the Doula is necessary to her mental health due to her history of PTSD and bipolar. The Pt wanted to Doula to stay to help take care of the baby. Floor RN called Charge RN to room. Charge RN explained the visitor policy and that we did not know about their plan to have the doula stay. Charge RN spoke to [facility] to see if we could make an exception. AC said No, she does not meet the criteria. Charge RN told patient what [facility] said; Charge RN said that the doula could stay or the support person could stay, we also have a nursery the baby could go to for a few hours if needed. Patient, support person, and doula stated understanding.

## 2021-12-31 NOTE — Lactation Note (Signed)
This note was copied from a baby's chart. Lactation Consultation Note  Patient Name: Kimberly Stark Date: 12/31/2021 Reason for consult: Initial assessment;Mother's request;Early term 37-38.6wks Age:33 hours Infant had 2 void diapers since birth. Birth Parent feeding choice is : breastfeeding and supplementing infant with formula.  Birth Parent is experienced with breastfeeding see maternal data below. Per Birth Parent, infant was  breastfeed and then supplemented with  30 mls of formula at previous feeding. Infant latched on Birth Parent left breast using the football hold position, infant was on and off the breast was still BF after 12 minutes when LC left the room.  LC discussed infant's small tummy size and suggested Birth Parent decrease supplementation to 13 mls per feeding  of formula on Day 1 after latching infant at the breast and to pace feed infant the formula. Birth Parent will continue to BF infant according to hunger cues, on demand, 8 + times within 24 hours, STS. Birth Parent knows to call [place] if their are any BF questions, concerns or if further latch assistance is needed. Mom made aware of O/P services, breastfeeding support groups, community resources, and our phone # for post-discharge questions.     Maternal Data Has patient been taught Hand Expression?: Yes Does the patient have breastfeeding experience prior to this delivery?: Yes How long did the patient breastfeed?: Birth Parent BF 1st child for 6 months and 2nd child for 3 months  Feeding Mother's Current Feeding Choice: Breast Milk and Formula  LATCH Score Latch: Repeated attempts needed to sustain latch, nipple held in mouth throughout feeding, stimulation needed to elicit sucking reflex.  Audible Swallowing: A few with stimulation  Type of Nipple: Everted at rest and after stimulation  Comfort (Breast/Nipple): Soft / non-tender  Hold (Positioning): Assistance needed to correctly position  infant at breast and maintain latch.  LATCH Score: 7   Lactation Tools Discussed/Used    Interventions Interventions: Breast feeding basics reviewed;Assisted with latch;Skin to skin;Breast compression;Adjust position;Support pillows;Position options;Education;LC Services brochure  Discharge Pump: Licensed conveyancer (Per Birth Parent,  she has Asencion Noble II DEBP at home.)  Consult Status Consult Status: Follow-up Date: 12/31/21 Follow-up type: In-patient    Vicente Serene 12/31/2021, 1:38 AM

## 2021-12-31 NOTE — Progress Notes (Addendum)
POSTPARTUM PROGRESS NOTE  POD #1  Subjective:  Kimberly Stark is a 31 y.o. [id number] s/p rLTCS at [redacted]w[redacted]d.  She reports she doing well. No acute events overnight. She reports she is doing well. She denies any problems with ambulating, voiding or po intake. Denies nausea or vomiting. She has passed flatus. Pain is well controlled.  Lochia is minimal.  Objective: Blood pressure 118/67, pulse 81, temperature 98.6 F (37 C), temperature source Oral, resp. rate 18, height 5\' 4"  (1.626 m), weight 102.4 kg, last menstrual period 03/22/2021, SpO2 99 %, currently breastfeeding.  Physical Exam:  General: alert, cooperative and no distress Chest: no respiratory distress Heart:regular rate, distal pulses intact Abdomen: soft, nontender,  Uterine Fundus: firm, appropriately tender DVT Evaluation: No calf swelling or tenderness Extremities: No LE edema Skin: warm, dry; incision clean/dry/intact w/ pressure and honeycomb dressing in place  Recent Labs    12/30/21 0657 12/31/21 0758  HGB 8.9* 7.4*  HCT 29.3* 23.2*    Assessment/Plan: Kimberly Stark is a 31 y.o. [id number] s/p rLTCS at [redacted]w[redacted]d for AOD and failed induction.  POD#1 - Doing welll; pain well controlled. H/H appropriate  Routine postpartum care  OOB, ambulated  Lovenox for VTE prophylaxis Anemia: asymptomatic  -IV venofer x1 Contraception: Declined Feeding: both  Dispo: Plan for discharge 10/22-10/23.   LOS: 3 days   Gerlene Fee, DO OB Fellow, [place] for [place] 12/31/2021, 9:17 AM

## 2021-12-31 NOTE — Progress Notes (Signed)
Pt last two blood pressure 146/72 and 141/75. Pt has no signs or symptoms of Preeclampsia. Dr. Clement Husbands notified. No new orders.

## 2022-01-01 MED ORDER — NIFEDIPINE ER OSMOTIC RELEASE 30 MG PO TB24
30.0000 mg | ORAL_TABLET | Freq: Two times a day (BID) | ORAL | Status: DC
  Administered 2022-01-01 – 2022-01-02 (×2): 30 mg via ORAL
  Filled 2022-01-01 (×2): qty 1

## 2022-01-01 MED ORDER — BISACODYL 10 MG RE SUPP
10.0000 mg | Freq: Every day | RECTAL | Status: DC | PRN
  Administered 2022-01-01: 10 mg via RECTAL
  Filled 2022-01-01: qty 1

## 2022-01-01 NOTE — Plan of Care (Signed)
  Problem: Education: Goal: Knowledge of condition will improve Outcome: Completed/Met   Problem: Activity: Goal: Will verbalize the importance of balancing activity with adequate rest periods Outcome: Completed/Met   Problem: Coping: Goal: Ability to identify and utilize available resources and services will improve Outcome: Completed/Met   Problem: Life Cycle: Goal: Chance of risk for complications during the postpartum period will decrease Outcome: Completed/Met   Problem: Role Relationship: Goal: Ability to demonstrate positive interaction with newborn will improve Outcome: Completed/Met   Problem: Education: Goal: Knowledge of condition will improve Outcome: Completed/Met   Problem: Activity: Goal: Will verbalize the importance of balancing activity with adequate rest periods Outcome: Completed/Met   Problem: Role Relationship: Goal: Ability to demonstrate positive interaction with newborn will improve Outcome: Completed/Met

## 2022-01-01 NOTE — Lactation Note (Signed)
This note was copied from a baby's chart. Lactation Consultation Note  Patient Name: Kimberly Stark [id number] Date: 01/01/2022 Reason for consult: Follow-up assessment;Early term 37-38.6wks Age:33 hours Mom stated it is time for baby to BF. LC placed baby at the breast. Mom told LC how she sets the bed up for BF in football position. Baby latched well then fell asleep. Baby needed stimulation to keep feeding. Parents has a good plan w/feeding that is working for them. Mom is supplementing after BF. Answered questions and parents told LC about the other children's feeding experiences. Encouraged to call for assistance as needed.  Maternal Data    Feeding Nipple Type: Slow - flow  LATCH Score Latch: Repeated attempts needed to sustain latch, nipple held in mouth throughout feeding, stimulation needed to elicit sucking reflex.  Audible Swallowing: Spontaneous and intermittent  Type of Nipple: Everted at rest and after stimulation  Comfort (Breast/Nipple): Soft / non-tender  Hold (Positioning): Assistance needed to correctly position infant at breast and maintain latch.  LATCH Score: 8   Lactation Tools Discussed/Used    Interventions Interventions: Breast feeding basics reviewed;Assisted with latch;Skin to skin;Breast massage;Hand express;Breast compression;Adjust position;Support pillows;Position options  Discharge    Consult Status Consult Status: Follow-up Date: 01/02/22 Follow-up type: In-patient    Theodoro Kalata 01/01/2022, 10:18 PM

## 2022-01-01 NOTE — Progress Notes (Addendum)
POSTPARTUM PROGRESS NOTE  POD #2  Subjective:  Kimberly Stark is a 31 y.o. [id number] s/p rLTCS at [redacted]w[redacted]d.  She reports she doing well. No acute events overnight. She says she is doing well, but had 4 large palm sized clots overnight. She is asymptomatic, no dizziness, shortness of breath. She denies any problems with ambulating, voiding or po intake. Denies nausea or vomiting. She has not passed flatus. Pain is moderately controlled.   Objective: Blood pressure 131/72, pulse 80, temperature 97.7 F (36.5 C), temperature source Oral, resp. rate 18, height 5\' 4"  (1.626 m), weight 102.4 kg, last menstrual period 03/22/2021, SpO2 100 %, currently breastfeeding.  Physical Exam:  General: alert, cooperative and no distress Chest: no respiratory distress Heart:regular rate, distal pulses intact Abdomen: soft, tender to palpation, distended  Uterine Fundus: firm, appropriately tender DVT Evaluation: No calf swelling or tenderness Extremities: No edema Skin: warm, dry; incision clean/dry/intact w/ honeycomb dressing in place  Recent Labs    12/30/21 0657 12/31/21 0758  HGB 8.9* 7.4*  HCT 29.3* 23.2*    Assessment/Plan: Kimberly Stark is a 31 y.o. [id number] s/p rLTCS at [redacted]w[redacted]d for failure to progress after attempting TOLAC. Marland Kitchen  POD#2 - Doing welll; pain  controlled. H/H appropriate  Continue to monitor blood loss  Routine postpartum care  OOB, ambulated  Lovenox for VTE prophylaxis Anemia: asymptomatic  S/p IV venofer   Contraception: declined Feeding: breast  Dispo: Plan for discharge .   LOS: 4 days   Lowry Ram, MD  PGY-1, [facility]  01/01/2022, 8:26 AM   Attestation of Attending Supervision of Resident: Evaluation and management procedures were performed by the [facility] Medicine Resident under my supervision. I was immediately available for direct supervision, assistance and direction throughout this encounter.  I also confirm that I have verified the information  documented in the resident's note, and that I have also personally reperformed the pertinent components of the physical exam and all of the medical decision making activities.  I have also made any necessary editorial changes.    Patient found ambulating from the bathroom this morning. She reports feeling well and expressed concerns at the size of the clots that she passed yesterday. She states she continued to pass large size clots overnight (approximately the size of her palm). She denies feeling dizzy or lightheaded. She has not passed flatus and abdomen is softly distended. Advised patient to inform the nurse of any further passage of clots. Encouraged ambulation. Case discussed with patient's nurse who confirms the passage of a large clot yesterday day time. She reports a smaller clot overnight and scant vaginal bleeding this morning. Nurse advised to contact MD with any further episodes of passage of clots.  Mora Bellman, MD Attending [place], [facility] for [facility], [facility] 01/01/2022 11:31 AM

## 2022-01-01 NOTE — Progress Notes (Signed)
Pt had two blood pressures 145/72 and 142/68. Pt has no signs/symptoms of Preeclampsia. Dr. Clement Husbands notified. See new orders. Will continue to monitor pt.

## 2022-01-02 ENCOUNTER — Other Ambulatory Visit (HOSPITAL_COMMUNITY): Payer: Self-pay

## 2022-01-02 MED ORDER — NIFEDIPINE ER 30 MG PO TB24
30.0000 mg | ORAL_TABLET | Freq: Two times a day (BID) | ORAL | 0 refills | Status: DC
  Filled 2022-01-02: qty 30, 15d supply, fill #0

## 2022-01-02 MED ORDER — ENOXAPARIN SODIUM 40 MG/0.4ML IJ SOSY
40.0000 mg | PREFILLED_SYRINGE | INTRAMUSCULAR | 0 refills | Status: DC
  Filled 2022-01-02: qty 12, 30d supply, fill #0

## 2022-01-02 MED ORDER — FUROSEMIDE 20 MG PO TABS
20.0000 mg | ORAL_TABLET | Freq: Two times a day (BID) | ORAL | 0 refills | Status: DC
  Filled 2022-01-02: qty 4, 2d supply, fill #0

## 2022-01-02 MED ORDER — ACETAMINOPHEN 500 MG PO TABS
1000.0000 mg | ORAL_TABLET | Freq: Four times a day (QID) | ORAL | 0 refills | Status: DC
  Filled 2022-01-02: qty 30, 4d supply, fill #0

## 2022-01-02 MED ORDER — IBUPROFEN 600 MG PO TABS
600.0000 mg | ORAL_TABLET | Freq: Four times a day (QID) | ORAL | 0 refills | Status: DC
  Filled 2022-01-02: qty 30, 8d supply, fill #0

## 2022-01-02 MED ORDER — OXYCODONE HCL 5 MG PO TABS
5.0000 mg | ORAL_TABLET | ORAL | 0 refills | Status: DC | PRN
  Filled 2022-01-02: qty 30, 3d supply, fill #0

## 2022-01-02 NOTE — Lactation Note (Signed)
This note was copied from a baby's chart. Lactation Consultation Note  Patient Name: Kimberly Stark GGYIR'S Date: 01/02/2022 Reason for consult: Follow-up assessment Age:33 hours  P3, Mother's breasts are filling. Provided nursing pads. Observed breastfeeding with intermittent swallows. Reviewed engorgement care and monitoring voids/stools.  Feeding Mother's Current Feeding Choice: Breast Milk and Formula  LATCH Score Latch: Grasps breast easily, tongue down, lips flanged, rhythmical sucking.  Audible Swallowing: A few with stimulation  Type of Nipple: Everted at rest and after stimulation  Comfort (Breast/Nipple): Soft / non-tender  Hold (Positioning): No assistance needed to correctly position infant at breast.  LATCH Score: 9   Interventions Interventions: Education  Discharge Discharge Education: Engorgement and breast care;Warning signs for feeding baby  Consult Status Consult Status: Complete Date: 01/02/22    Vivianne Master [facility] 01/02/2022, 1:47 PM

## 2022-01-03 ENCOUNTER — Ambulatory Visit: Payer: Self-pay

## 2022-01-03 NOTE — Lactation Note (Signed)
This note was copied from a baby's chart. Lactation Consultation Note  Patient Name: Kimberly Stark [id number] Date: 01/03/2022 Reason for consult: Follow-up assessment Age:34 days LC reviewed and updated the doc flow sheets on nights per mom.  LC reviewed the LC plan:  Feed the baby 15 20 mins ( 30 mins max ) , supplement, start at 30 ml of baby is content call it a feeding  Post pump both breast for 15 -20 mins , save milk , once volume replace formula with EBM.  Feed with feeding cues  and by 3 hours STS.  LC recommended a LC O/P appt and mom receptive . Aware she will receive a LC O/P call.  See below for Latch score , LC assist , noted intermittent rubbing noise when latch. Swallows noted  Maternal Data Has patient been taught Hand Expression?: Yes  Feeding Mother's Current Feeding Choice: Breast Milk and Formula  LATCH Score Latch: Grasps breast easily, tongue down, lips flanged, rhythmical sucking.  Audible Swallowing: Spontaneous and intermittent  Type of Nipple: Everted at rest and after stimulation  Comfort (Breast/Nipple): Filling, red/small blisters or bruises, mild/mod discomfort  Hold (Positioning): Assistance needed to correctly position infant at breast and maintain latch.  LATCH Score: 8   Lactation Tools Discussed/Used  Hand pump - with 3 flanges   Interventions Interventions: Breast feeding basics reviewed;Assisted with latch;Skin to skin;Breast massage;Hand express;Breast compression;Adjust position;Support pillows;Position options;Hand pump;Education;LC Services brochure  Discharge Discharge Education: Engorgement and breast care;Warning signs for feeding baby;Outpatient recommendation;Outpatient Epic message sent Pump: Hands Free;Personal;Manual WIC Program: No  Consult Status Consult Status: Complete Date: 01/03/22    Myer Haff 01/03/2022, 9:06 AM

## 2022-01-06 ENCOUNTER — Ambulatory Visit (INDEPENDENT_AMBULATORY_CARE_PROVIDER_SITE_OTHER): Payer: No Typology Code available for payment source

## 2022-01-06 ENCOUNTER — Other Ambulatory Visit: Payer: Self-pay

## 2022-01-06 VITALS — BP 143/75 | HR 89 | Wt 208.5 lb

## 2022-01-06 DIAGNOSIS — O165 Unspecified maternal hypertension, complicating the puerperium: Secondary | ICD-10-CM

## 2022-01-06 MED ORDER — NIFEDIPINE ER OSMOTIC RELEASE 30 MG PO TB24: 30.0000 mg | ORAL_TABLET | Freq: Two times a day (BID) | ORAL | 0 refills | Status: DC

## 2022-01-06 NOTE — Progress Notes (Signed)
Blood Pressure Check Visit  Kimberly Stark is here for blood pressure check following C-section without labor on 12/30/21. BP today is 143/75. Patient endorses any headache. Reviewed with Elray Mcgregor, MD. She advised pt increase Procardia to 60mg  BID. Pt informed of plan of care and agreeable. Pt to return in 1 week for BP check.  Incision Check Visit  Kimberly Stark is here for incision check following repeat c-section on 12/30/21.   Assessment: Incision in clean, dry and intact  Education: Reviewed good wound care and s/s of infection with patient.  Patient will follow up nurse visit in 1 week for BP.  Darlyne Russian, RN 01/06/2022  8:54 AM

## 2022-01-13 ENCOUNTER — Ambulatory Visit: Payer: No Typology Code available for payment source

## 2022-02-06 ENCOUNTER — Ambulatory Visit: Payer: No Typology Code available for payment source | Admitting: Obstetrics and Gynecology

## 2022-02-22 ENCOUNTER — Ambulatory Visit: Payer: No Typology Code available for payment source | Admitting: Obstetrics and Gynecology

## 2022-03-13 NOTE — L&D Delivery Note (Deleted)
Kimberly Stark PROCEDURE DATE: 02/16/2023  PREOPERATIVE DIAGNOSES: Intrauterine pregnancy at [redacted]w[redacted]d weeks gestation; previous uterine incision 2x prior c-section and severe preeclampsia  POSTOPERATIVE DIAGNOSES: The same  PROCEDURE: Repeat Low Transverse Cesarean Section  SURGEON:  Dr. Milas Hock  ASSISTANT:  Dr. Wyn Forster An experienced assistant was required given the standard of surgical care given the complexity of the case.  This assistant was needed for exposure, dissection, suctioning, retraction, instrument exchange, and for overall help during the procedure.  ANESTHESIOLOGY TEAM: Anesthesiologist: Mariann Barter, MD CRNA: Graciela Husbands, CRNA; Elgie Congo, CRNA  INDICATIONS: Kimberly Stark is a 32 y.o. [id number] at [redacted]w[redacted]d here for cesarean section secondary to the indications listed under preoperative diagnoses; please see preoperative note for further details.  The risks of surgery were discussed with the patient including but were not limited to: bleeding which may require transfusion or reoperation; infection which may require antibiotics; injury to bowel, bladder, ureters or other surrounding organs; injury to the fetus; need for additional procedures including hysterectomy in the event of a life-threatening hemorrhage; formation of adhesions; placental abnormalities wth subsequent pregnancies; incisional problems; thromboembolic phenomenon and other postoperative/anesthesia complications.  The patient concurred with the proposed plan, giving informed written consent for the procedure.    FINDINGS:  Viable female infant in cephalic presentation.  Apgars 4,8, and 9.  Clear amniotic fluid.  Intact placenta, three vessel cord.  Normal uterus, fallopian tubes and ovaries bilaterally. Mild adhesive disease superficial to the hysterotomy of peritoneum and omentum. Minimal adhesive disease of the uterine serosa.  ANESTHESIA: Spinal INTRAVENOUS FLUIDS: 472 ml   ESTIMATED BLOOD  LOSS: 448 ml URINE OUTPUT:  550 ml SPECIMENS: Placenta sent to pathology COMPLICATIONS: None immediate  PROCEDURE IN DETAIL:  The patient preoperatively received intravenous antibiotics and had sequential compression devices applied to her lower extremities.  She was then taken to the operating room where spinal anesthesia was administered was administed. She was then placed in a dorsal supine position with a leftward tilt, and prepped and draped in a sterile manner.  A foley catheter was placed into her bladder and attached to constant gravity.    After an adequate timeout was performed, a Pfannenstiel skin incision was made with scalpel on her preexisting scar and carried through to the underlying layer of fascia. The fascia was incised in the midline, and this incision was extended bilaterally using the Mayo scissors.  Kocher clamps were applied to the superior aspect of the fascial incision and the underlying rectus muscles were dissected off bluntly and sharply.  A similar process was carried out on the inferior aspect of the fascial incision. The rectus muscles were separated in the midline and the peritoneum was entered bluntly. The Alexis self-retaining retractor was introduced into the abdominal cavity.  Attention was turned to the lower uterine segment where a low transverse hysterotomy was made with a scalpel and extended bilaterally bluntly.  The infant was successfully delivered, the cord was clamped and cut after one minute, and the infant was handed over to the awaiting neonatology team. Uterine massage was then administered, and the placenta delivered intact with a three-vessel cord. The uterus was then exteriorized and cleared of clots and debris.    The hysterotomy was closed with 0 Vicryl in a running locked fashion, and an imbricating layer was also placed with 0 Vicryl. Figure-of-eight 0 Vicryl serosal stitches were placed to help with hemostasis.  Attention was then turned to the  left fallopian tube. It was  visualized and grasped with a Babcock clamp.  Using the Babcock clamp the tube was followed all the way down to the fimbriated edge.  Kelly clamps were then placed across the lower third of the tube.  This portion of the tube was then ligated and sutured using a tie of plain gut.  An additional suture of 2-0 vicryl was placed and excellent hemostasis was confirmed. Attention was turned to the right side and in a similar fashion the lower portion of the right fallopian tube was clamped, cut and suture ligated.  Excellent hemostasis was confirmed.    The pelvis was cleared of all clot and debris. The retractor was removed.  The peritoneum was closed with a 0 Vicryl running stitch. The fascia was then closed using 0 Vicryl in a running fashion.  The subcutaneous layer was irrigated and everything was hemostatic . The skin was closed with a 4-0 Vicryl subcuticular stitch. The patient tolerated the procedure well. Sponge, instrument and needle counts were correct x 3.  She was taken to the recovery room in stable condition.    Wyn Forster, MD FMOB Fellow, Faculty practice [facility], [facility] for [facility]

## 2022-09-05 ENCOUNTER — Telehealth: Payer: Self-pay | Admitting: Family Medicine

## 2022-09-05 DIAGNOSIS — Z86711 Personal history of pulmonary embolism: Secondary | ICD-10-CM

## 2022-09-05 DIAGNOSIS — O0990 Supervision of high risk pregnancy, unspecified, unspecified trimester: Secondary | ICD-10-CM

## 2022-09-05 NOTE — Telephone Encounter (Signed)
Patient called in today very upset, said she has been calling for weeks she had difficulties reaching [place]. she said the [place] was supose to send over her records , I told her we have noit recieved them.  I expressed to the patient that we have to have something stating she is pregnant before we can get her scheduled.  Ms.Mcadams got even more upset,  I told her we really need something saying she is pregnant, I told her i would make an excepation and schedule her. I did tell her that we would still need the paperwork,  she also said she was out of her lovenov medication, I told her I would have a nurse to call her back about that.  this is the reason why the patient was scheduled and also scheduled with an intake appointment.

## 2022-09-05 NOTE — Telephone Encounter (Signed)
Patient requesting a RX for Lovenox

## 2022-09-05 NOTE — Telephone Encounter (Addendum)
Based on Care Everywhere review patient had positive UPT at [place] on 08/04/22. Per chart review patient does have history of PE. Last pregnancy (c-section 12/28/21) she was prescribed Lovenox 40 daily. Scheduled with Shawnie Pons, MD for initial prenatal on 09/22/22. Will forward to [facility] for recommendation on Lovenox. Pt would prefer a MyChart message in response.  Patient reports irregular menstrual periods prior to pregnancy. Does not have a period each month. Reports last normal period was 06/02/22. Did also experience spotting on 07/03/22 and 07/04/22. At the end of May she had an ultrasound "in a blue van" and was 6-7 weeks, cannot recall where this was done. Pt will return Monday, 09/11/22 for dating [place] in our office. Pt approx 11 weeks today.  Patient is concerned about elevated blood pressure. BP elevated at [facility] recently, but she states this has never been elevated outside of pregnancy. Explained we will check at initial visit. Pt would like [place] to know she plans a repeat c-section and is concerned that her pregnancies are close together.

## 2022-09-06 MED ORDER — ENOXAPARIN SODIUM 40 MG/0.4ML IJ SOSY: 40.0000 mg | PREFILLED_SYRINGE | INTRAMUSCULAR | 2 refills | Status: DC

## 2022-09-11 ENCOUNTER — Other Ambulatory Visit: Payer: Self-pay | Admitting: General Practice

## 2022-09-11 ENCOUNTER — Other Ambulatory Visit: Payer: No Typology Code available for payment source

## 2022-09-11 DIAGNOSIS — Z3491 Encounter for supervision of normal pregnancy, unspecified, first trimester: Secondary | ICD-10-CM

## 2022-09-21 ENCOUNTER — Ambulatory Visit: Payer: No Typology Code available for payment source

## 2022-09-21 DIAGNOSIS — Z3491 Encounter for supervision of normal pregnancy, unspecified, first trimester: Secondary | ICD-10-CM

## 2022-09-21 DIAGNOSIS — Z3A12 12 weeks gestation of pregnancy: Secondary | ICD-10-CM | POA: Diagnosis not present

## 2022-09-22 ENCOUNTER — Other Ambulatory Visit (HOSPITAL_COMMUNITY)
Admission: RE | Admit: 2022-09-22 | Discharge: 2022-09-22 | Disposition: A | Discharge status: Home / Self Care | Payer: No Typology Code available for payment source | Source: Ambulatory Visit | Attending: Family Medicine | Admitting: Family Medicine

## 2022-09-22 ENCOUNTER — Encounter: Payer: Self-pay | Admitting: Family Medicine

## 2022-09-22 ENCOUNTER — Ambulatory Visit (INDEPENDENT_AMBULATORY_CARE_PROVIDER_SITE_OTHER): Payer: No Typology Code available for payment source | Admitting: Family Medicine

## 2022-09-22 ENCOUNTER — Other Ambulatory Visit: Payer: Self-pay

## 2022-09-22 VITALS — BP 128/76 | HR 71 | Wt 195.3 lb

## 2022-09-22 DIAGNOSIS — Z3A12 12 weeks gestation of pregnancy: Secondary | ICD-10-CM | POA: Diagnosis not present

## 2022-09-22 DIAGNOSIS — O09891 Supervision of other high risk pregnancies, first trimester: Secondary | ICD-10-CM

## 2022-09-22 DIAGNOSIS — O09291 Supervision of pregnancy with other poor reproductive or obstetric history, first trimester: Secondary | ICD-10-CM

## 2022-09-22 DIAGNOSIS — Z86711 Personal history of pulmonary embolism: Secondary | ICD-10-CM

## 2022-09-22 DIAGNOSIS — Z6833 Body mass index (BMI) 33.0-33.9, adult: Secondary | ICD-10-CM

## 2022-09-22 DIAGNOSIS — O09893 Supervision of other high risk pregnancies, third trimester: Secondary | ICD-10-CM

## 2022-09-22 DIAGNOSIS — R011 Cardiac murmur, unspecified: Secondary | ICD-10-CM

## 2022-09-22 DIAGNOSIS — O0990 Supervision of high risk pregnancy, unspecified, unspecified trimester: Secondary | ICD-10-CM | POA: Insufficient documentation

## 2022-09-22 DIAGNOSIS — O10911 Unspecified pre-existing hypertension complicating pregnancy, first trimester: Secondary | ICD-10-CM | POA: Diagnosis not present

## 2022-09-22 DIAGNOSIS — O34219 Maternal care for unspecified type scar from previous cesarean delivery: Secondary | ICD-10-CM

## 2022-09-22 DIAGNOSIS — O10919 Unspecified pre-existing hypertension complicating pregnancy, unspecified trimester: Secondary | ICD-10-CM

## 2022-09-22 DIAGNOSIS — Z141 Cystic fibrosis carrier: Secondary | ICD-10-CM

## 2022-09-22 DIAGNOSIS — Z98891 History of uterine scar from previous surgery: Secondary | ICD-10-CM

## 2022-09-22 DIAGNOSIS — Z9889 Other specified postprocedural states: Secondary | ICD-10-CM

## 2022-09-22 DIAGNOSIS — O2620 Pregnancy care for patient with recurrent pregnancy loss, unspecified trimester: Secondary | ICD-10-CM

## 2022-09-22 DIAGNOSIS — O09293 Supervision of pregnancy with other poor reproductive or obstetric history, third trimester: Secondary | ICD-10-CM

## 2022-09-22 DIAGNOSIS — Z720 Tobacco use: Secondary | ICD-10-CM

## 2022-09-22 DIAGNOSIS — O0991 Supervision of high risk pregnancy, unspecified, first trimester: Secondary | ICD-10-CM | POA: Diagnosis not present

## 2022-09-22 MED ORDER — ASPIRIN 81 MG PO TBEC: 162.0000 mg | DELAYED_RELEASE_TABLET | Freq: Every day | ORAL | 3 refills | Status: DC

## 2022-09-22 NOTE — Progress Notes (Signed)
Subjective:   Kimberly Stark is a 32 y.o. [id number] at [redacted]w[redacted]d by early ultrasound being seen today for her first obstetrical visit.  Her obstetrical history is significant for obesity, pre-eclampsia, smoker, and h/o PE, h/o prior C-section, h/o cerclage x 2 with PTB.  Patient does intend to breast feed. Pregnancy history fully reviewed.  Patient reports no complaints.  HISTORY: OB History  Gravida Para Term Preterm AB Living  12 3 2 1 8 3   SAB IAB Ectopic Multiple Live Births  7 1 0 0 3    # Outcome Date GA Lbr Len/2nd Weight Sex Type Anes PTL Lv  12 Current           11 Term 12/30/21 [redacted]w[redacted]d  6 lb 6.3 oz (2.9 kg) F CS-LTranv Spinal  LIV     Name: YARNELL, DAUGHERTY     Apgar1: 9  Apgar5: 9  10 Term 08/13/19 [redacted]w[redacted]d  8 lb 3 oz (3.714 kg)  CS-LTranv  Y LIV     Complications: Preeclampsia, Cervical cerclage suture present, unspecified trimester  9 Preterm 11/23/12 [redacted]w[redacted]d  5 lb 8.2 oz (2.5 kg)  Vag-Spont   LIV     Complications: Preeclampsia, History of cervical cerclage  8 IAB           7 SAB           6 SAB           5 SAB           4 SAB           3 SAB           2 SAB           1 SAB             Obstetric Comments  Pre-E with both preg, induced with both   Past Medical History:  Diagnosis Date   Anemia    Asthma    Bipolar 1 disorder (HCC)    Blood transfusion without reported diagnosis    Chiari malformation type I (HCC)    Heart murmur    History of postpartum hemorrhage    History of pulmonary embolism 2021   in setting of covid   Pregnancy induced hypertension    Preterm labor    Sickle cell trait (HCC)    UTI (urinary tract infection)    Past Surgical History:  Procedure Laterality Date   CERVICAL BIOPSY  W/ LOOP ELECTRODE EXCISION     CERVICAL CERCLAGE     CESAREAN SECTION     CESAREAN SECTION N/A 12/30/2021   Procedure: CESAREAN SECTION;  Surgeon: Hermina Staggers, MD;  Location: [facility] LD ORS;  Service: Obstetrics;  Laterality: N/A;   DILATION AND  CURETTAGE OF UTERUS     Family History  Problem Relation Age of Onset   Stroke Mother    Heart disease Mother        hx open heart issues   Hypertension Mother    Diabetes Mother    Cancer Father        started as colon, w/mets   Kidney disease Father        renal failure   Diabetes Father    Cancer Maternal Grandmother    Diabetes Paternal Grandmother    Social History   Tobacco Use   Smoking status: Some Days    Current packs/day: 0.25    Types: Cigarettes   Smokeless tobacco: Never  Vaping Use  Vaping status: Every Day   Substances: Nicotine  Substance Use Topics   Alcohol use: Not Currently   Drug use: Not Currently   Allergies  Allergen Reactions   Iodinated Contrast Media Shortness Of Breath, Rash and Swelling   Latex Anaphylaxis   Shellfish Allergy Anaphylaxis   Iodine Hives   Penicillins Hives   Current Outpatient Medications on File Prior to Visit  Medication Sig Dispense Refill   acetaminophen (TYLENOL) 500 MG tablet Take 2 tablets (1,000 mg total) by mouth every 6 (six) hours. 30 tablet 0   albuterol (VENTOLIN HFA) 108 (90 Base) MCG/ACT inhaler Inhale 1-2 puffs into the lungs every 6 (six) hours as needed for wheezing or shortness of breath.     enoxaparin (LOVENOX) 40 MG/0.4ML injection Inject 0.4 mLs (40 mg total) into the skin daily. 12 mL 2   Prenatal MV & Min w/FA-DHA (PRENATAL GUMMIES PO) Take 2 tablets by mouth in the morning.     NIFEdipine (ADALAT CC) 30 MG 24 hr tablet Take 1 tablet (30 mg total) by mouth 2 (two) times daily. 30 tablet 0   No current facility-administered medications on file prior to visit.     Exam   Vitals:   09/22/22 1003  BP: 128/76  Pulse: 71  Weight: 195 lb 4.8 oz (88.6 kg)   Fetal Heart Rate (bpm): 151  Pelvic Exam: Perineum: no hemorrhoids, normal perineum   Vulva: normal external genitalia, no lesions   Vagina:  normal mucosa, normal discharge   Cervix: no lesions and normal, pap smear done.    Bony  Pelvis: average  System: General: well-developed, well-nourished female in no acute distress   Skin: normal coloration and turgor, no rashes   Neurologic: oriented, normal, negative, normal mood   Extremities: normal strength, tone, and muscle mass, ROM of all joints is normal   HEENT PERRLA, extraocular movement intact and sclera clear, anicteric   Mouth/Teeth mucous membranes moist, pharynx normal without lesions and dental hygiene good   Neck supple and no masses   Cardiovascular: regular rate and rhythm   Respiratory:  no respiratory distress, normal breath sounds   Abdomen: soft, non-tender; bowel sounds normal; no masses,  no organomegaly     Assessment:   Pregnancy: [id number] Patient Active Problem List   Diagnosis Date Noted   Chronic hypertension affecting pregnancy 12/28/2021   Habitual aborter, currently pregnant 12/22/2021   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/13/2021   History of LEEP (loop electrosurgical excision procedure) of cervix complicating pregnancy in third trimester 11/21/2021   BMI 38.0-38.9,adult 11/21/2021   Obesity complicating pregnancy in third trimester 11/21/2021   History of preterm delivery, currently pregnant in third trimester 11/21/2021   Anticoagulated 11/21/2021   Migraine with typical aura 09/19/2021   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/2021   Insomnia 09/19/2021   Glaucoma 09/19/2021   Chronic post-traumatic stress disorder 09/19/2021   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   History of pulmonary embolus (PE) 06/22/2021   Tobacco abuse 06/22/2021   History of C-section 06/22/2021   History of HELLP syndrome, currently pregnant, third trimester 06/09/2021   Supervision of high risk pregnancy, antepartum 06/09/2021   History of cervical cerclage 06/09/2021   Iron deficiency anemia 07/03/2019   Chronic hypertension 05/23/2019     Plan:  1. Supervision of high risk pregnancy, antepartum New OB labs Carrier of CF and [place] - Culture, OB  Urine - PANORAMA PRENATAL TEST - CBC/D/Plt+RPR+Rh+ABO+RubIgG... - [place] MFM OB DETAIL +14 WK; Future - Cytology -  PAP( [place])  2. Chronic hypertension affecting pregnancy Reports BPs are normal outside of pregnancy and does not need meds. She reports that pregnancy begins and by early 2nd trimester she is on meds. Has H/o HELLP. Was on 2 meds last pregnancy with multiple trips to [facility] for BP investigations. Baseline labs ASA 162 mg - aspirin EC 81 MG tablet; Take 2 tablets (162 mg total) by mouth daily.  Dispense: 180 tablet; Refill: 3 - Comprehensive metabolic panel - Protein / creatinine ratio, urine - AMB Referral to Cardio Obstetrics  3. BMI 33.0-33.9,adult - Hemoglobin A1c  4. History of preterm delivery, currently pregnant in third trimester With cerclage x 2. She strongly desires to not have a cerclage this pregnancy, as she did not last pregnancy and she wants to follow serial cervical lengths instead. - [place] MFM OB TRANSVAGINAL; Future  5. Delivered by cesarean delivery following previous cesarean delivery Desries RCS and BTL (likely)  6. Cystic fibrosis carrier Partner testing has no been done  7. History of pulmonary embolus (PE) On Lovenox  - aspirin EC 81 MG tablet; Take 2 tablets (162 mg total) by mouth daily.  Dispense: 180 tablet; Refill: 3  8. Tobacco abuse Has cut down, advised to stop  9. Habitual aborter, currently pregnant H/o APLA investigation in the past with negative labs but mom has APS and she has h/o multiple losses (7) and h/o PE Repeat labs - Cardiolipin antibodies, IgM+IgG - Lupus anticoagulant panel( [facility])  10. Heart murmur Reports h/o syncope, poor BP control, prior echos Will refer to Cardio/OB - AMB Referral to Cardio Obstetrics   Initial labs drawn. Continue prenatal vitamins. Genetic Screening discussed, NIPS: ordered. Ultrasound discussed; fetal anatomic survey: ordered. Problem list reviewed and  updated. The nature of [facility] with multiple MDs and other Advanced Practice Providers was explained to patient; also emphasized that residents, students are part of our team. Routine obstetric precautions reviewed. Return in 4 weeks (on 10/20/2022).

## 2022-09-23 LAB — PROTEIN / CREATININE RATIO, URINE
Creatinine, Urine: 126.4 mg/dL
Protein, Ur: 12.9 mg/dL
Protein/Creat Ratio: 102 mg/g creat (ref 0–200)

## 2022-09-24 LAB — URINE CULTURE, OB REFLEX

## 2022-09-24 LAB — CULTURE, OB URINE

## 2022-09-26 LAB — CYTOLOGY - PAP
Chlamydia: NEGATIVE
Comment: NEGATIVE
Comment: NEGATIVE
Comment: NEGATIVE
Comment: NORMAL
Diagnosis: NEGATIVE
High risk HPV: NEGATIVE
Neisseria Gonorrhea: NEGATIVE
Trichomonas: NEGATIVE

## 2022-09-27 LAB — LUPUS ANTICOAGULANT PANEL
Dilute Viper Venom Time: 29.9 s (ref 0.0–47.0)
PTT Lupus Anticoagulant: 37.3 s (ref 0.0–43.5)

## 2022-09-27 LAB — CBC/D/PLT+RPR+RH+ABO+RUBIGG...
Basophils Absolute: 0 10*3/uL (ref 0.0–0.2)
Basos: 0 %
EOS (ABSOLUTE): 0.1 10*3/uL (ref 0.0–0.4)
Eos: 1 %
HCV Ab: NONREACTIVE
HIV Screen 4th Generation wRfx: NONREACTIVE
Hematocrit: 36.7 % (ref 34.0–46.6)
Hemoglobin: 12.2 g/dL (ref 11.1–15.9)
Hepatitis B Surface Ag: NEGATIVE
Immature Grans (Abs): 0.1 10*3/uL (ref 0.0–0.1)
Immature Granulocytes: 1 %
Lymphocytes Absolute: 2.4 10*3/uL (ref 0.7–3.1)
Lymphs: 24 %
MCH: 29.2 pg (ref 26.6–33.0)
MCHC: 33.2 g/dL (ref 31.5–35.7)
MCV: 88 fL (ref 79–97)
Monocytes Absolute: 0.6 10*3/uL (ref 0.1–0.9)
Monocytes: 7 %
Neutrophils Absolute: 6.5 10*3/uL (ref 1.4–7.0)
Neutrophils: 67 %
Platelets: 363 10*3/uL (ref 150–450)
RBC: 4.18 x10E6/uL (ref 3.77–5.28)
RDW: 13.2 % (ref 11.7–15.4)
RPR Ser Ql: NONREACTIVE
Rh Factor: POSITIVE
Rubella Antibodies, IGG: 5.18 index (ref >0.99)
WBC: 9.7 10*3/uL (ref 3.4–10.8)

## 2022-09-27 LAB — COMPREHENSIVE METABOLIC PANEL
ALT: 14 IU/L (ref 0–32)
AST: 11 IU/L (ref 0–40)
Albumin: 4.3 g/dL (ref 3.9–4.9)
Alkaline Phosphatase: 85 IU/L (ref 44–121)
BUN/Creatinine Ratio: 7 — ABNORMAL LOW (ref 9–23)
BUN: 4 mg/dL — ABNORMAL LOW (ref 6–20)
Bilirubin Total: 0.3 mg/dL (ref 0.0–1.2)
CO2: 21 mmol/L (ref 20–29)
Calcium: 9.7 mg/dL (ref 8.7–10.2)
Chloride: 102 mmol/L (ref 96–106)
Creatinine, Ser: 0.57 mg/dL (ref 0.57–1.00)
Globulin, Total: 2.4 g/dL (ref 1.5–4.5)
Glucose: 85 mg/dL (ref 70–99)
Potassium: 4 mmol/L (ref 3.5–5.2)
Sodium: 137 mmol/L (ref 134–144)
Total Protein: 6.7 g/dL (ref 6.0–8.5)
eGFR: 122 mL/min/{1.73_m2} (ref >59)

## 2022-09-27 LAB — HEMOGLOBIN A1C
Est. average glucose Bld gHb Est-mCnc: 120 mg/dL
Hgb A1c MFr Bld: 5.8 % — ABNORMAL HIGH (ref 4.8–5.6)

## 2022-09-27 LAB — CARDIOLIPIN ANTIBODIES, IGM+IGG
Anticardiolipin IgG: <9 GPL U/mL (ref 0–14)
Anticardiolipin IgM: <9 MPL U/mL (ref 0–12)

## 2022-09-27 LAB — HCV INTERPRETATION

## 2022-09-27 LAB — AB SCR+ANTIBODY ID

## 2022-09-28 ENCOUNTER — Telehealth: Payer: Self-pay | Admitting: Family Medicine

## 2022-09-28 NOTE — Telephone Encounter (Signed)
-----   Message from Levie Heritage sent at 09/28/2022  8:26 AM EDT ----- Needs to come in for early 2hr GTT.

## 2022-09-28 NOTE — Telephone Encounter (Signed)
Attempted to reach Kimberly Stark about an appointment Dr. Shawnie Pons said she needed. Left a detailed message about her appointment scheduled for her 2 hour glucose test.

## 2022-09-29 ENCOUNTER — Other Ambulatory Visit: Payer: Self-pay | Admitting: Lactation Services

## 2022-09-29 DIAGNOSIS — O0990 Supervision of high risk pregnancy, unspecified, unspecified trimester: Secondary | ICD-10-CM

## 2022-10-03 ENCOUNTER — Other Ambulatory Visit: Payer: No Typology Code available for payment source

## 2022-10-03 ENCOUNTER — Other Ambulatory Visit: Payer: Self-pay

## 2022-10-03 DIAGNOSIS — O0990 Supervision of high risk pregnancy, unspecified, unspecified trimester: Secondary | ICD-10-CM

## 2022-10-03 LAB — PANORAMA PRENATAL TEST FULL PANEL:PANORAMA TEST PLUS 5 ADDITIONAL MICRODELETIONS: FETAL FRACTION: 4

## 2022-10-06 ENCOUNTER — Other Ambulatory Visit: Payer: Self-pay

## 2022-10-06 ENCOUNTER — Other Ambulatory Visit: Payer: No Typology Code available for payment source

## 2022-10-06 DIAGNOSIS — O0990 Supervision of high risk pregnancy, unspecified, unspecified trimester: Secondary | ICD-10-CM

## 2022-10-06 DIAGNOSIS — Z6833 Body mass index (BMI) 33.0-33.9, adult: Secondary | ICD-10-CM

## 2022-10-18 ENCOUNTER — Other Ambulatory Visit: Payer: Self-pay

## 2022-10-18 ENCOUNTER — Ambulatory Visit: Payer: No Typology Code available for payment source | Attending: Family Medicine

## 2022-10-18 ENCOUNTER — Ambulatory Visit: Payer: No Typology Code available for payment source

## 2022-10-18 VITALS — BP 142/65 | HR 65

## 2022-10-18 DIAGNOSIS — Z9889 Other specified postprocedural states: Secondary | ICD-10-CM | POA: Insufficient documentation

## 2022-10-18 DIAGNOSIS — O09213 Supervision of pregnancy with history of pre-term labor, third trimester: Secondary | ICD-10-CM

## 2022-10-18 DIAGNOSIS — O10012 Pre-existing essential hypertension complicating pregnancy, second trimester: Secondary | ICD-10-CM | POA: Diagnosis not present

## 2022-10-18 DIAGNOSIS — O10912 Unspecified pre-existing hypertension complicating pregnancy, second trimester: Secondary | ICD-10-CM | POA: Insufficient documentation

## 2022-10-18 DIAGNOSIS — O2622 Pregnancy care for patient with recurrent pregnancy loss, second trimester: Secondary | ICD-10-CM | POA: Diagnosis not present

## 2022-10-18 DIAGNOSIS — O99213 Obesity complicating pregnancy, third trimester: Secondary | ICD-10-CM | POA: Insufficient documentation

## 2022-10-18 DIAGNOSIS — O09899 Supervision of other high risk pregnancies, unspecified trimester: Secondary | ICD-10-CM | POA: Diagnosis present

## 2022-10-18 DIAGNOSIS — O09299 Supervision of pregnancy with other poor reproductive or obstetric history, unspecified trimester: Secondary | ICD-10-CM

## 2022-10-18 DIAGNOSIS — O34219 Maternal care for unspecified type scar from previous cesarean delivery: Secondary | ICD-10-CM

## 2022-10-18 DIAGNOSIS — F17210 Nicotine dependence, cigarettes, uncomplicated: Secondary | ICD-10-CM

## 2022-10-18 DIAGNOSIS — Z3A16 16 weeks gestation of pregnancy: Secondary | ICD-10-CM

## 2022-10-18 DIAGNOSIS — O09292 Supervision of pregnancy with other poor reproductive or obstetric history, second trimester: Secondary | ICD-10-CM | POA: Diagnosis not present

## 2022-10-18 DIAGNOSIS — E669 Obesity, unspecified: Secondary | ICD-10-CM

## 2022-10-18 DIAGNOSIS — Z8759 Personal history of other complications of pregnancy, childbirth and the puerperium: Secondary | ICD-10-CM

## 2022-10-18 DIAGNOSIS — O3442 Maternal care for other abnormalities of cervix, second trimester: Secondary | ICD-10-CM

## 2022-10-18 DIAGNOSIS — O99212 Obesity complicating pregnancy, second trimester: Secondary | ICD-10-CM

## 2022-10-18 DIAGNOSIS — O99332 Smoking (tobacco) complicating pregnancy, second trimester: Secondary | ICD-10-CM

## 2022-10-20 ENCOUNTER — Ambulatory Visit (INDEPENDENT_AMBULATORY_CARE_PROVIDER_SITE_OTHER): Payer: Medicaid Other | Admitting: Cardiology

## 2022-10-20 ENCOUNTER — Encounter: Payer: Self-pay | Admitting: Cardiology

## 2022-10-20 VITALS — BP 142/78 | HR 81 | Ht 64.0 in | Wt 201.2 lb

## 2022-10-20 DIAGNOSIS — O09299 Supervision of pregnancy with other poor reproductive or obstetric history, unspecified trimester: Secondary | ICD-10-CM | POA: Diagnosis not present

## 2022-10-20 DIAGNOSIS — O10919 Unspecified pre-existing hypertension complicating pregnancy, unspecified trimester: Secondary | ICD-10-CM | POA: Diagnosis not present

## 2022-10-20 DIAGNOSIS — R0789 Other chest pain: Secondary | ICD-10-CM | POA: Diagnosis not present

## 2022-10-20 DIAGNOSIS — I10 Essential (primary) hypertension: Secondary | ICD-10-CM

## 2022-10-20 DIAGNOSIS — Z3A24 24 weeks gestation of pregnancy: Secondary | ICD-10-CM

## 2022-10-20 MED ORDER — NIFEDIPINE ER OSMOTIC RELEASE 30 MG PO TB24: 30.0000 mg | ORAL_TABLET | Freq: Every day | ORAL | 3 refills | Status: DC

## 2022-10-20 NOTE — Progress Notes (Unsigned)
Cardio-Obstetrics Clinic  New Evaluation  Date:  10/20/2022   ID:  Kimberly Stark, DOB 1990/06/26, MRN [id number]  PCP:  Default, Provider, MD   [facility] Providers Cardiologist:  Thomasene Ripple, DO  Electrophysiologist:  None   { Click to update primary MD,subspecialty MD or APP then REFRESH:1}    Referring MD: Reva Bores, MD   Chief Complaint: " I have had preeclampsia twice."   History of Present Illness:    Kimberly Stark is a 32 y.o. female [[id number]] who is being seen today for the evaluation of chronic hypertension in pregnancy at the request of Reva Bores, MD.    Prior CV Studies Reviewed: The following studies were reviewed today:   Past Medical History:  Diagnosis Date   Anemia    Asthma    Bipolar 1 disorder (HCC)    Blood transfusion without reported diagnosis    Chiari malformation type I (HCC)    Chronic post-traumatic stress disorder 09/19/2021   Glaucoma 09/19/2021   Heart murmur    History of postpartum hemorrhage    History of pulmonary embolism 2021   in setting of covid   Insomnia 09/19/2021   Migraine with typical aura 09/19/2021   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/2021   Pregnancy induced hypertension    Preterm labor    Sickle cell trait (HCC)    UTI (urinary tract infection)     Past Surgical History:  Procedure Laterality Date   CERVICAL BIOPSY  W/ LOOP ELECTRODE EXCISION     CERVICAL CERCLAGE     CESAREAN SECTION     CESAREAN SECTION N/A 12/30/2021   Procedure: CESAREAN SECTION;  Surgeon: Hermina Staggers, MD;  Location: [facility] LD ORS;  Service: Obstetrics;  Laterality: N/A;   DILATION AND CURETTAGE OF UTERUS     { Click here to update PMH, PSH, OB Hx then refresh note  :1}   OB History     Gravida  12   Para  3   Term  2   Preterm  1   AB  8   Living  3      SAB  7   IAB  1   Ectopic  0   Multiple  0   Live Births  3        Obstetric Comments  Pre-E with both preg, induced with both          { Click here to update OB Charting then refresh note  :1}    Current Medications: Current Meds  Medication Sig   acetaminophen (TYLENOL) 500 MG tablet Take 2 tablets (1,000 mg total) by mouth every 6 (six) hours.   albuterol (VENTOLIN HFA) 108 (90 Base) MCG/ACT inhaler Inhale 1-2 puffs into the lungs every 6 (six) hours as needed for wheezing or shortness of breath.   aspirin EC 81 MG tablet Take 2 tablets (162 mg total) by mouth daily.   enoxaparin (LOVENOX) 40 MG/0.4ML injection Inject 0.4 mLs (40 mg total) into the skin daily.   NIFEdipine (PROCARDIA-XL/NIFEDICAL-XL) 30 MG 24 hr tablet Take 1 tablet (30 mg total) by mouth daily.   Prenatal MV & Min w/FA-DHA (PRENATAL GUMMIES PO) Take 2 tablets by mouth in the morning.     Allergies:   Iodinated contrast media, Latex, Shellfish allergy, Iodine, and Penicillins   Social History   Socioeconomic History   Marital status: Single    Spouse name: Not on file   Number of children:  Not on file   Years of education: Not on file   Highest education level: Bachelor's degree (e.g., BA, AB, BS)  Occupational History   Not on file  Tobacco Use   Smoking status: Some Days    Current packs/day: 0.25    Types: Cigarettes   Smokeless tobacco: Never  Vaping Use   Vaping status: Some Days   Substances: Nicotine  Substance and Sexual Activity   Alcohol use: Not Currently   Drug use: Not Currently   Sexual activity: Yes    Birth control/protection: None  Other Topics Concern   Not on file  Social History Narrative   Not on file   Social Determinants of Health   Financial Resource Strain: High Risk (10/19/2021)   Overall Financial Resource Strain (CARDIA)    Difficulty of Paying Living Expenses: Very hard  Food Insecurity: No Food Insecurity (12/28/2021)   Hunger Vital Sign    Worried About Running Out of Food in the Last Year: Never true    Ran Out of Food in the Last Year: Never true  Recent Concern: Food Insecurity - Food  Insecurity Present (10/19/2021)   Hunger Vital Sign    Worried About Running Out of Food in the Last Year: Sometimes true    Ran Out of Food in the Last Year: Sometimes true  Transportation Needs: No Transportation Needs (12/28/2021)   PRAPARE - Administrator, Civil Service (Medical): No    Lack of Transportation (Non-Medical): No  Recent Concern: Transportation Needs - Unmet Transportation Needs (10/19/2021)   PRAPARE - Transportation    Lack of Transportation (Medical): Yes    Lack of Transportation (Non-Medical): Yes  Physical Activity: Insufficiently Active (10/19/2021)   Exercise Vital Sign    Days of Exercise per Week: 2 days    Minutes of Exercise per Session: 30 min  Stress: Stress Concern Present (10/19/2021)   [employer] of Occupational Health - Occupational Stress Questionnaire    Feeling of Stress : Rather much  Social Connections: Moderately Integrated (10/19/2021)   Social Connection and Isolation Panel [NHANES]    Frequency of Communication with Friends and Family: Once a week    Frequency of Social Gatherings with Friends and Family: Three times a week    Attends Religious Services: More than 4 times per year    Active Member of Clubs or Organizations: No    Attends Engineer, structural: Not on file    Marital Status: Living with partner  { Click here to update SDOH then refresh :1}    Family History  Problem Relation Age of Onset   Stroke Mother    Heart disease Mother        hx open heart issues   Hypertension Mother    Diabetes Mother    Cancer Father        started as colon, w/mets   Kidney disease Father        renal failure   Diabetes Father    Cancer Maternal Grandmother    Diabetes Paternal Grandmother    { Click here to update FH then refresh note    :1}   ROS:   Please see the history of present illness.     All other systems reviewed and are negative.   Labs/EKG Reviewed:    EKG:   EKG was ordered today.  The ekg  ordered today demonstrates sinus rhythm.   Recent Labs: 09/22/2022: ALT 14; BUN 4; Creatinine, Ser 0.57; Hemoglobin  12.2; Platelets 363; Potassium 4.0; Sodium 137   Recent Lipid Panel No results found for: "CHOL", "TRIG", "HDL", "CHOLHDL", "LDLCALC", "LDLDIRECT"  Physical Exam:    VS:  BP (!) 142/78   Pulse 81   Ht 5\' 4"  (1.626 m)   Wt 201 lb 3.2 oz (91.3 kg)   SpO2 96%   BMI 34.54 kg/m     Wt Readings from Last 3 Encounters:  10/20/22 201 lb 3.2 oz (91.3 kg)  09/22/22 195 lb 4.8 oz (88.6 kg)  01/06/22 208 lb 8 oz (94.6 kg)     GEN: Well nourished, well developed in no acute distress HEENT: Normal NECK: No JVD; No carotid bruits LYMPHATICS: No lymphadenopathy CARDIAC: RRR, no murmurs, rubs, gallops RESPIRATORY:  Clear to auscultation without rales, wheezing or rhonchi  ABDOMEN: Soft, non-tender, non-distended MUSCULOSKELETAL:  No edema; No deformity  SKIN: Warm and dry NEUROLOGIC:  Alert and oriented x 3 PSYCHIATRIC:  Normal affect    Risk Assessment/Risk Calculators:   { Click to calculate CARPREG II - THEN refresh note :1}  CARPREG II Risk Prediction Index Score:  1.  The patient's risk for a primary cardiac event is 5%. { Click to caclulate Mod WHO Class of CV Risk - THEN refresh note :1}     { Click for CHADS2VASc Score - THEN Refresh Note    :[id number]}      ASSESSMENT & PLAN:    Chronic Hypertension in Pregnancy Morbid obesity   She is hypertensive in the office today - will start Nifedipine 30 mg daily.    Patient Instructions  Medication Instructions:  Your physician has recommended you make the following change in your medication:  START: Nifedipine 30 mg once daily  *If you need a refill on your cardiac medications before your next appointment, please call your pharmacy*   Lab Work: None   Testing/Procedures: Your physician has requested that you have an echocardiogram - OB. Echocardiography is a painless test that uses sound waves to  create images of your heart. It provides your doctor with information about the size and shape of your heart and how well your heart's chambers and valves are working. This procedure takes approximately one hour. There are no restrictions for this procedure. Please do NOT wear cologne, perfume, aftershave, or lotions (deodorant is allowed). Please arrive 15 minutes prior to your appointment time.    Follow-Up: At [facility], you and your health needs are our priority.  As part of our continuing mission to provide you with exceptional heart care, we have created designated Provider Care Teams.  These Care Teams include your primary Cardiologist (physician) and Advanced Practice Providers (APPs -  Physician Assistants and Nurse Practitioners) who all work together to provide you with the care you need, when you need it.  We recommend signing up for the patient portal called "MyChart".  Sign up information is provided on this After Visit Summary.  MyChart is used to connect with patients for Virtual Visits (Telemedicine).  Patients are able to view lab/test results, encounter notes, upcoming appointments, etc.  Non-urgent messages can be sent to your provider as well.   To learn more about what you can do with MyChart, go to [email or web address].    Your next appointment:   8-10 week(s)  Provider:   Thomasene Ripple, DO     Other Instructions 1-800-QUIT-NOW [phone])   If you interested in 1-1 telephonic tobacco cessation coaching with [employer], please call [phone] to enroll today.  Steps to Quit Smoking Smoking tobacco is the leading cause of preventable death. It can affect almost every organ in the body. Smoking puts you and people around you at risk for many serious, long-lasting (chronic) diseases. Quitting smoking can be hard, but it is one of the best things that you can do for your health. It is never too late to quit. Do not give up if you cannot  quit the first time. Some people need to try many times to quit. Do your best to stick to your quit plan, and talk with your doctor if you have any questions or concerns. How do I get ready to quit? Pick a date to quit. Set a date within the next 2 weeks to give you time to prepare. Write down the reasons why you are quitting. Keep this list in places where you will see it often. Tell your family, friends, and co-workers that you are quitting. Their support is important. Talk with your doctor about the choices that may help you quit. Find out if your health insurance will pay for these treatments. Know the people, places, things, and activities that make you want to smoke (triggers). Avoid them. What first steps can I take to quit smoking? Throw away all cigarettes at home, at work, and in your car. Throw away the things that you use when you smoke, such as ashtrays and lighters. Clean your car. Empty the ashtray. Clean your home, including curtains and carpets. What can I do to help me quit smoking? Talk with your doctor about taking medicines and seeing a counselor. You are more likely to succeed when you do both. If you are pregnant or breastfeeding: Talk with your doctor about counseling or other ways to quit smoking. Do not take medicine to help you quit smoking unless your doctor tells you to. Quit right away Quit smoking completely, instead of slowly cutting back on how much you smoke over a period of time. Stopping smoking right away may be more successful than slowly quitting. Go to counseling. In-person is best if this is an option. You are more likely to quit if you go to counseling sessions regularly. Take medicine You may take medicines to help you quit. Some medicines need a prescription, and some you can buy over-the-counter. Some medicines may contain a drug called nicotine to replace the nicotine in cigarettes. Medicines may: Help you stop having the desire to smoke  (cravings). Help to stop the problems that come when you stop smoking (withdrawal symptoms). Your doctor may ask you to use: Nicotine patches, gum, or lozenges. Nicotine inhalers or sprays. Non-nicotine medicine that you take by mouth. Find resources Find resources and other ways to help you quit smoking and remain smoke-free after you quit. They include: Online chats with a Veterinary surgeon. Phone quitlines. Printed Materials engineer. Support groups or group counseling. Text messaging programs. Mobile phone apps. Use apps on your mobile phone or tablet that can help you stick to your quit plan. Examples of free services include Quit Guide from the CDC and smokefree.gov  What can I do to make it easier to quit?  Talk to your family and friends. Ask them to support and encourage you. Call a phone quitline, such as 1-800-QUIT-NOW, reach out to support groups, or work with a Veterinary surgeon. Ask people who smoke to not smoke around you. Avoid places that make you want to smoke, such as: Bars. Parties. Smoke-break areas at work. Spend time with people who do not smoke.  Lower the stress in your life. Stress can make you want to smoke. Try these things to lower stress: Getting regular exercise. Doing deep-breathing exercises. Doing yoga. Meditating. What benefits will I see if I quit smoking? Over time, you may have: A better sense of smell and taste. Less coughing and sore throat. A slower heart rate. Lower blood pressure. Clearer skin. Better breathing. Fewer sick days. Summary Quitting smoking can be hard, but it is one of the best things that you can do for your health. Do not give up if you cannot quit the first time. Some people need to try many times to quit. When you decide to quit smoking, make a plan to help you succeed. Quit smoking right away, not slowly over a period of time. When you start quitting, get help and support to keep you smoke-free. This information is not  intended to replace advice given to you by your health care provider. Make sure you discuss any questions you have with your health care provider. Document Revised: 02/18/2021 Document Reviewed: 02/18/2021 Elsevier Patient Education  2023 Elsevier Inc.     Dispo:  No follow-ups on file.   Medication Adjustments/Labs and Tests Ordered: Current medicines are reviewed at length with the patient today.  Concerns regarding medicines are outlined above.  Tests Ordered: Orders Placed This Encounter  Procedures   AMB Referral to Heartcare Pharm-D   EKG 12-Lead   ECHOCARDIOGRAM COMPLETE   Medication Changes: Meds ordered this encounter  Medications   NIFEdipine (PROCARDIA-XL/NIFEDICAL-XL) 30 MG 24 hr tablet    Sig: Take 1 tablet (30 mg total) by mouth daily.    Dispense:  90 tablet    Refill:  3

## 2022-10-20 NOTE — Patient Instructions (Addendum)
Medication Instructions:  Your physician has recommended you make the following change in your medication:  START: Nifedipine 30 mg once daily  *If you need a refill on your cardiac medications before your next appointment, please call your pharmacy*   Lab Work: None   Testing/Procedures: Your physician has requested that you have an echocardiogram - OB. Echocardiography is a painless test that uses sound waves to create images of your heart. It provides your doctor with information about the size and shape of your heart and how well your heart's chambers and valves are working. This procedure takes approximately one hour. There are no restrictions for this procedure. Please do NOT wear cologne, perfume, aftershave, or lotions (deodorant is allowed). Please arrive 15 minutes prior to your appointment time.    Follow-Up: At [facility], you and your health needs are our priority.  As part of our continuing mission to provide you with exceptional heart care, we have created designated Provider Care Teams.  These Care Teams include your primary Cardiologist (physician) and Advanced Practice Providers (APPs -  Physician Assistants and Nurse Practitioners) who all work together to provide you with the care you need, when you need it.  We recommend signing up for the patient portal called "MyChart".  Sign up information is provided on this After Visit Summary.  MyChart is used to connect with patients for Virtual Visits (Telemedicine).  Patients are able to view lab/test results, encounter notes, upcoming appointments, etc.  Non-urgent messages can be sent to your provider as well.   To learn more about what you can do with MyChart, go to [email or web address].    Your next appointment:   8-10 week(s)  Provider:   Thomasene Ripple, DO     Other Instructions 1-800-QUIT-NOW ([phone])   If you interested in 1-1 telephonic tobacco cessation coaching with [employer],  please call [phone] to enroll today.   Steps to Quit Smoking Smoking tobacco is the leading cause of preventable death. It can affect almost every organ in the body. Smoking puts you and people around you at risk for many serious, long-lasting (chronic) diseases. Quitting smoking can be hard, but it is one of the best things that you can do for your health. It is never too late to quit. Do not give up if you cannot quit the first time. Some people need to try many times to quit. Do your best to stick to your quit plan, and talk with your doctor if you have any questions or concerns. How do I get ready to quit? Pick a date to quit. Set a date within the next 2 weeks to give you time to prepare. Write down the reasons why you are quitting. Keep this list in places where you will see it often. Tell your family, friends, and co-workers that you are quitting. Their support is important. Talk with your doctor about the choices that may help you quit. Find out if your health insurance will pay for these treatments. Know the people, places, things, and activities that make you want to smoke (triggers). Avoid them. What first steps can I take to quit smoking? Throw away all cigarettes at home, at work, and in your car. Throw away the things that you use when you smoke, such as ashtrays and lighters. Clean your car. Empty the ashtray. Clean your home, including curtains and carpets. What can I do to help me quit smoking? Talk with your doctor about taking medicines and seeing a counselor.  You are more likely to succeed when you do both. If you are pregnant or breastfeeding: Talk with your doctor about counseling or other ways to quit smoking. Do not take medicine to help you quit smoking unless your doctor tells you to. Quit right away Quit smoking completely, instead of slowly cutting back on how much you smoke over a period of time. Stopping smoking right away may be more successful than slowly  quitting. Go to counseling. In-person is best if this is an option. You are more likely to quit if you go to counseling sessions regularly. Take medicine You may take medicines to help you quit. Some medicines need a prescription, and some you can buy over-the-counter. Some medicines may contain a drug called nicotine to replace the nicotine in cigarettes. Medicines may: Help you stop having the desire to smoke (cravings). Help to stop the problems that come when you stop smoking (withdrawal symptoms). Your doctor may ask you to use: Nicotine patches, gum, or lozenges. Nicotine inhalers or sprays. Non-nicotine medicine that you take by mouth. Find resources Find resources and other ways to help you quit smoking and remain smoke-free after you quit. They include: Online chats with a Veterinary surgeon. Phone quitlines. Printed Materials engineer. Support groups or group counseling. Text messaging programs. Mobile phone apps. Use apps on your mobile phone or tablet that can help you stick to your quit plan. Examples of free services include Quit Guide from the CDC and smokefree.gov  What can I do to make it easier to quit?  Talk to your family and friends. Ask them to support and encourage you. Call a phone quitline, such as 1-800-QUIT-NOW, reach out to support groups, or work with a Veterinary surgeon. Ask people who smoke to not smoke around you. Avoid places that make you want to smoke, such as: Bars. Parties. Smoke-break areas at work. Spend time with people who do not smoke. Lower the stress in your life. Stress can make you want to smoke. Try these things to lower stress: Getting regular exercise. Doing deep-breathing exercises. Doing yoga. Meditating. What benefits will I see if I quit smoking? Over time, you may have: A better sense of smell and taste. Less coughing and sore throat. A slower heart rate. Lower blood pressure. Clearer skin. Better breathing. Fewer sick  days. Summary Quitting smoking can be hard, but it is one of the best things that you can do for your health. Do not give up if you cannot quit the first time. Some people need to try many times to quit. When you decide to quit smoking, make a plan to help you succeed. Quit smoking right away, not slowly over a period of time. When you start quitting, get help and support to keep you smoke-free. This information is not intended to replace advice given to you by your health care provider. Make sure you discuss any questions you have with your health care provider. Document Revised: 02/18/2021 Document Reviewed: 02/18/2021 Elsevier Patient Education  2023 [employer].

## 2022-10-24 ENCOUNTER — Other Ambulatory Visit: Payer: Self-pay

## 2022-10-24 ENCOUNTER — Encounter: Payer: Self-pay | Admitting: Obstetrics and Gynecology

## 2022-10-24 ENCOUNTER — Ambulatory Visit (INDEPENDENT_AMBULATORY_CARE_PROVIDER_SITE_OTHER): Payer: No Typology Code available for payment source | Admitting: Obstetrics and Gynecology

## 2022-10-24 VITALS — BP 140/79 | HR 90 | Wt 202.1 lb

## 2022-10-24 DIAGNOSIS — Z98891 History of uterine scar from previous surgery: Secondary | ICD-10-CM

## 2022-10-24 DIAGNOSIS — I10 Essential (primary) hypertension: Secondary | ICD-10-CM

## 2022-10-24 DIAGNOSIS — Z3009 Encounter for other general counseling and advice on contraception: Secondary | ICD-10-CM | POA: Insufficient documentation

## 2022-10-24 DIAGNOSIS — O09293 Supervision of pregnancy with other poor reproductive or obstetric history, third trimester: Secondary | ICD-10-CM

## 2022-10-24 DIAGNOSIS — O0993 Supervision of high risk pregnancy, unspecified, third trimester: Secondary | ICD-10-CM

## 2022-10-24 DIAGNOSIS — O0990 Supervision of high risk pregnancy, unspecified, unspecified trimester: Secondary | ICD-10-CM

## 2022-10-24 DIAGNOSIS — Z141 Cystic fibrosis carrier: Secondary | ICD-10-CM

## 2022-10-24 DIAGNOSIS — Z86711 Personal history of pulmonary embolism: Secondary | ICD-10-CM

## 2022-10-24 DIAGNOSIS — Z3A16 16 weeks gestation of pregnancy: Secondary | ICD-10-CM

## 2022-10-24 DIAGNOSIS — O09893 Supervision of other high risk pregnancies, third trimester: Secondary | ICD-10-CM

## 2022-10-24 LAB — POCT URINALYSIS DIP (DEVICE)
Glucose, UA: NEGATIVE mg/dL
Hgb urine dipstick: NEGATIVE
Ketones, ur: NEGATIVE mg/dL
Nitrite: NEGATIVE
Protein, ur: 30 mg/dL — AB
Specific Gravity, Urine: >=1.03 (ref 1.005–1.030)
Urobilinogen, UA: 2 mg/dL — ABNORMAL HIGH (ref 0.0–1.0)
pH: 6 (ref 5.0–8.0)

## 2022-10-24 MED ORDER — LABETALOL HCL 200 MG PO TABS: 200.0000 mg | ORAL_TABLET | Freq: Two times a day (BID) | ORAL | 3 refills | Status: DC

## 2022-10-24 NOTE — Progress Notes (Signed)
Patient informed me that she has been put on Nifedipine last week, however, she is still having elevated BP readings "140s-150s" systolic    She also informed me that she has been having headaches without any vision disturbances. She is concerned that she is developing pre eclampsia

## 2022-10-24 NOTE — Patient Instructions (Signed)

## 2022-10-24 NOTE — Progress Notes (Signed)
Subjective:  Kimberly Stark is a 32 y.o. [id number] at [redacted]w[redacted]d being seen today for ongoing prenatal care.  She is currently monitored for the following issues for this high-risk pregnancy and has History of HELLP syndrome, currently pregnant, third trimester; Supervision of high risk pregnancy, antepartum; History of cervical cerclage; History of pulmonary embolus (PE); Tobacco abuse; History of C-section; Cystic fibrosis carrier; Chronic hypertension; History of LEEP (loop electrosurgical excision procedure) of cervix complicating pregnancy in third trimester; BMI 38.0-38.9,adult; Obesity complicating pregnancy in third trimester; History of preterm delivery, currently pregnant in third trimester; Arnold-Chiari malformation (HCC); Habitual aborter, currently pregnant; Iron deficiency anemia; Chronic hypertension affecting pregnancy; and Unwanted fertility on their problem list.  Patient reports  every day HA since starting Procardia. Was unable to tolerate Procardia with last pregnancy due to HA .  Contractions: Not present. Vag. Bleeding: None.  Movement: Present. Denies leaking of fluid.   The following portions of the patient's history were reviewed and updated as appropriate: allergies, current medications, past family history, past medical history, past social history, past surgical history and problem list. Problem list updated.  Objective:   Vitals:   10/24/22 1536  BP: (!) 140/79  Pulse: 90  Weight: 202 lb 1.6 oz (91.7 kg)    Fetal Status: Fetal Heart Rate (bpm): 140   Movement: Present     General:  Alert, oriented and cooperative. Patient is in no acute distress.  Skin: Skin is warm and dry. No rash noted.   Cardiovascular: Normal heart rate noted  Respiratory: Normal respiratory effort, no problems with respiration noted  Abdomen: Soft, gravid, appropriate for gestational age. Pain/Pressure: Absent     Pelvic:  Cervical exam deferred        Extremities: Normal range of motion.      Mental Status: Normal mood and affect. Normal behavior. Normal judgment and thought content.   Urinalysis:      Assessment and Plan:  Pregnancy: [id number] at [redacted]w[redacted]d  1. Supervision of high risk pregnancy, antepartum Stable AFP today  2. Chronic hypertension Will d/c Procardia due to intolerance Start Labetalol Nurse visit in 2 weeks for BP check  3. History of pulmonary embolus (PE) Stable  Continue with Lovenox  4. History of HELLP syndrome, currently pregnant, third trimester No S/Sx at present  5. History of C-section Desires repeat with BTL  6. Cystic fibrosis carrier Stable  7. History of preterm delivery, currently pregnant in third trimester Declines cercalge Serial CL with MFM Last CL 3.3  8. Unwanted fertility BTL papers at 28 week  Preterm labor symptoms and general obstetric precautions including but not limited to vaginal bleeding, contractions, leaking of fluid and fetal movement were reviewed in detail with the patient. Please refer to After Visit Summary for other counseling recommendations.  Return in about 4 weeks (around 11/21/2022) for OB visit, face to face, MD only.   Hermina Staggers, MD

## 2022-10-26 LAB — CBC: Platelets: 379 10*3/uL (ref 150–450)

## 2022-10-31 ENCOUNTER — Other Ambulatory Visit: Payer: Self-pay | Admitting: Obstetrics and Gynecology

## 2022-10-31 DIAGNOSIS — Z8759 Personal history of other complications of pregnancy, childbirth and the puerperium: Secondary | ICD-10-CM

## 2022-10-31 DIAGNOSIS — O10912 Unspecified pre-existing hypertension complicating pregnancy, second trimester: Secondary | ICD-10-CM

## 2022-11-01 ENCOUNTER — Ambulatory Visit: Payer: No Typology Code available for payment source | Attending: Obstetrics and Gynecology

## 2022-11-01 ENCOUNTER — Ambulatory Visit: Payer: No Typology Code available for payment source | Admitting: *Deleted

## 2022-11-01 VITALS — BP 159/52 | HR 78

## 2022-11-01 DIAGNOSIS — O285 Abnormal chromosomal and genetic finding on antenatal screening of mother: Secondary | ICD-10-CM

## 2022-11-01 DIAGNOSIS — O09292 Supervision of pregnancy with other poor reproductive or obstetric history, second trimester: Secondary | ICD-10-CM

## 2022-11-01 DIAGNOSIS — Z8759 Personal history of other complications of pregnancy, childbirth and the puerperium: Secondary | ICD-10-CM | POA: Diagnosis present

## 2022-11-01 DIAGNOSIS — O99891 Other specified diseases and conditions complicating pregnancy: Secondary | ICD-10-CM

## 2022-11-01 DIAGNOSIS — O99212 Obesity complicating pregnancy, second trimester: Secondary | ICD-10-CM

## 2022-11-01 DIAGNOSIS — Z3A18 18 weeks gestation of pregnancy: Secondary | ICD-10-CM

## 2022-11-01 DIAGNOSIS — F17210 Nicotine dependence, cigarettes, uncomplicated: Secondary | ICD-10-CM | POA: Diagnosis not present

## 2022-11-01 DIAGNOSIS — O10912 Unspecified pre-existing hypertension complicating pregnancy, second trimester: Secondary | ICD-10-CM

## 2022-11-01 DIAGNOSIS — O99332 Smoking (tobacco) complicating pregnancy, second trimester: Secondary | ICD-10-CM | POA: Diagnosis not present

## 2022-11-01 DIAGNOSIS — Q0700 Arnold-Chiari syndrome without spina bifida or hydrocephalus: Secondary | ICD-10-CM

## 2022-11-01 DIAGNOSIS — Z86711 Personal history of pulmonary embolism: Secondary | ICD-10-CM | POA: Insufficient documentation

## 2022-11-01 DIAGNOSIS — O10012 Pre-existing essential hypertension complicating pregnancy, second trimester: Secondary | ICD-10-CM

## 2022-11-01 DIAGNOSIS — E669 Obesity, unspecified: Secondary | ICD-10-CM

## 2022-11-01 DIAGNOSIS — Z141 Cystic fibrosis carrier: Secondary | ICD-10-CM

## 2022-11-07 ENCOUNTER — Telehealth: Payer: No Typology Code available for payment source | Admitting: Family Medicine

## 2022-11-07 DIAGNOSIS — R0602 Shortness of breath: Secondary | ICD-10-CM

## 2022-11-07 DIAGNOSIS — R509 Fever, unspecified: Secondary | ICD-10-CM

## 2022-11-07 DIAGNOSIS — Z3A18 18 weeks gestation of pregnancy: Secondary | ICD-10-CM

## 2022-11-07 NOTE — Progress Notes (Signed)
For the safety of you and your child, I recommend a face to face office visit with a health care provider.  Many mothers need to take medicines during their pregnancy and while nursing.  Almost all medicines pass into the breast milk in small quantities.  Most are generally considered safe for a mother to take but some medicines must be avoided.  After reviewing your E-Visit request, I recommend that you consult your OB/GYN or pediatrician for medical advice in relation to your condition and prescription medications while pregnant or breastfeeding.  NOTE:  There will be NO CHARGE for this eVisit  If you are having a true medical emergency please call 911.    For an urgent face to face visit, [place] has six urgent care centers for your convenience:     [facility] [place] Get Driving Directions [phone] [address] [place], [place] [address]    [facility] ([place]) Get Driving Directions [phone] [address] [place], [place] [address]  [facility] ([place]) Get Driving Directions [phone] [address] [place],  [place]  [address]  [facility] Get Driving Directions [phone] [address] [place], [place] [address]   [facility] [place] Get Driving Directions  [phone] [address] [place], [place] [address]   [facility] [place] Get Driving Directions [phone] [address] [place], [place] [address]  Your MyChart E-visit questionnaire answers were reviewed by a board certified advanced clinical practitioner to complete your personal care plan based on your specific symptoms.  Thank you for using e-Visits.    

## 2022-11-08 ENCOUNTER — Ambulatory Visit: Payer: No Typology Code available for payment source

## 2022-11-08 ENCOUNTER — Encounter (HOSPITAL_COMMUNITY): Payer: Self-pay | Admitting: Obstetrics and Gynecology

## 2022-11-08 ENCOUNTER — Inpatient Hospital Stay (HOSPITAL_COMMUNITY)
Admission: AD | Admit: 2022-11-08 | Discharge: 2022-11-08 | Disposition: A | Discharge status: Home / Self Care | Payer: No Typology Code available for payment source | Attending: Obstetrics and Gynecology | Admitting: Obstetrics and Gynecology

## 2022-11-08 VITALS — BP 133/64 | HR 80 | Wt 203.4 lb

## 2022-11-08 DIAGNOSIS — B9789 Other viral agents as the cause of diseases classified elsewhere: Secondary | ICD-10-CM | POA: Insufficient documentation

## 2022-11-08 DIAGNOSIS — O219 Vomiting of pregnancy, unspecified: Secondary | ICD-10-CM

## 2022-11-08 DIAGNOSIS — O99512 Diseases of the respiratory system complicating pregnancy, second trimester: Secondary | ICD-10-CM | POA: Insufficient documentation

## 2022-11-08 DIAGNOSIS — R12 Heartburn: Secondary | ICD-10-CM

## 2022-11-08 DIAGNOSIS — Z3A19 19 weeks gestation of pregnancy: Secondary | ICD-10-CM | POA: Insufficient documentation

## 2022-11-08 DIAGNOSIS — O98512 Other viral diseases complicating pregnancy, second trimester: Secondary | ICD-10-CM | POA: Insufficient documentation

## 2022-11-08 DIAGNOSIS — Z1152 Encounter for screening for COVID-19: Secondary | ICD-10-CM | POA: Insufficient documentation

## 2022-11-08 DIAGNOSIS — Z0130 Encounter for examination of blood pressure without abnormal findings: Secondary | ICD-10-CM

## 2022-11-08 DIAGNOSIS — O162 Unspecified maternal hypertension, second trimester: Secondary | ICD-10-CM

## 2022-11-08 DIAGNOSIS — O09292 Supervision of pregnancy with other poor reproductive or obstetric history, second trimester: Secondary | ICD-10-CM | POA: Diagnosis not present

## 2022-11-08 DIAGNOSIS — O26892 Other specified pregnancy related conditions, second trimester: Secondary | ICD-10-CM

## 2022-11-08 DIAGNOSIS — J069 Acute upper respiratory infection, unspecified: Secondary | ICD-10-CM | POA: Diagnosis present

## 2022-11-08 LAB — RESP PANEL BY RT-PCR (RSV, FLU A&B, COVID)  RVPGX2
Influenza A by PCR: NEGATIVE
Influenza B by PCR: NEGATIVE
Resp Syncytial Virus by PCR: NEGATIVE
SARS Coronavirus 2 by RT PCR: NEGATIVE

## 2022-11-08 LAB — URINALYSIS, ROUTINE W REFLEX MICROSCOPIC
Bilirubin Urine: NEGATIVE
Glucose, UA: NEGATIVE mg/dL
Hgb urine dipstick: NEGATIVE
Ketones, ur: NEGATIVE mg/dL
Leukocytes,Ua: NEGATIVE
Nitrite: NEGATIVE
Protein, ur: NEGATIVE mg/dL
Specific Gravity, Urine: 1.004 — ABNORMAL LOW (ref 1.005–1.030)
pH: 7 (ref 5.0–8.0)

## 2022-11-08 LAB — COMPREHENSIVE METABOLIC PANEL
ALT: 9 U/L (ref 0–44)
AST: 13 U/L — ABNORMAL LOW (ref 15–41)
Albumin: 3.1 g/dL — ABNORMAL LOW (ref 3.5–5.0)
Alkaline Phosphatase: 69 U/L (ref 38–126)
Anion gap: 12 (ref 5–15)
BUN: <5 mg/dL — ABNORMAL LOW (ref 6–20)
CO2: 18 mmol/L — ABNORMAL LOW (ref 22–32)
Calcium: 9.1 mg/dL (ref 8.9–10.3)
Chloride: 105 mmol/L (ref 98–111)
Creatinine, Ser: 0.58 mg/dL (ref 0.44–1.00)
GFR, Estimated: >60 mL/min (ref >60)
Glucose, Bld: 103 mg/dL — ABNORMAL HIGH (ref 70–99)
Potassium: 3.6 mmol/L (ref 3.5–5.1)
Sodium: 135 mmol/L (ref 135–145)
Total Bilirubin: 0.4 mg/dL (ref 0.3–1.2)
Total Protein: 6.1 g/dL — ABNORMAL LOW (ref 6.5–8.1)

## 2022-11-08 MED ORDER — PANTOPRAZOLE SODIUM 40 MG PO TBEC
40.0000 mg | DELAYED_RELEASE_TABLET | Freq: Every day | ORAL | 0 refills | Status: DC
Start: 2022-11-08 — End: 2023-01-02

## 2022-11-08 MED ORDER — SODIUM CHLORIDE 0.9 % IV SOLN
25.0000 mg | Freq: Once | INTRAVENOUS | Status: AC
  Administered 2022-11-08: 25 mg via INTRAVENOUS
  Filled 2022-11-08: qty 1

## 2022-11-08 MED ORDER — ONDANSETRON 4 MG PO TBDP: 4.0000 mg | ORAL_TABLET | Freq: Four times a day (QID) | ORAL | 0 refills | Status: DC | PRN

## 2022-11-08 MED ORDER — PROMETHAZINE HCL 25 MG PO TABS: 25.0000 mg | ORAL_TABLET | Freq: Four times a day (QID) | ORAL | 0 refills | Status: DC | PRN

## 2022-11-08 MED ORDER — LOPERAMIDE HCL 2 MG PO TABS: 2.0000 mg | ORAL_TABLET | Freq: Four times a day (QID) | ORAL | 0 refills | Status: DC | PRN

## 2022-11-08 MED ORDER — ACETAMINOPHEN 500 MG PO TABS
1000.0000 mg | ORAL_TABLET | Freq: Once | ORAL | Status: AC
  Administered 2022-11-08: 1000 mg via ORAL
  Filled 2022-11-08: qty 2

## 2022-11-08 MED ORDER — ONDANSETRON HCL 4 MG/2ML IJ SOLN
4.0000 mg | Freq: Once | INTRAMUSCULAR | Status: AC
  Administered 2022-11-08: 4 mg via INTRAVENOUS
  Filled 2022-11-08: qty 2

## 2022-11-08 NOTE — MAU Note (Addendum)
..  Kimberly Stark is a 32 y.o. 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 States she has had a fever, cough, and runny nose. Also reports n/v/d and heartburn. Has had x4 episodes of emesis in the last 24 hrs and 5-6 episodes of diarrhea. Denies VB or LOF. Last dose of 1000mg  of tylenol around 0600 this morning. Has been feeling winded and had to use her albuterol inhaler more frequently over the last few days.   Pain score: 5 Vitals:   11/08/22 1044  BP: (!) 143/76  Pulse: 81  Resp: 18  Temp: 97.6 F (36.4 C)  SpO2: 99%     FHT:145 Lab orders placed from triage:   UA

## 2022-11-08 NOTE — MAU Provider Note (Signed)
History     [id number]  Arrival date and time: 11/08/22 1011    Chief Complaint  Patient presents with   Headache   Emesis   Nausea   Fever     HPI Kimberly Stark is a 32 y.o. at [redacted]w[redacted]d with PMHx notable for cHTN, anrold-chiari malformation, hx of c section x2, hx of LEEP, hx of cerclage, hx of HELLP, who presents for upper respiratory symptoms.   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 She reports there has been a number of COVID cases and one flu case recently For the past three days or so she has had runny nose, cough, fever, and feeling overall unwell No vaginal bleeding or leaking fluid Has also been having lots of nausea and vomiting as well as diarrhea Not able to eat or drink much Has been taking OTC meds without much improvement Also feeling short of breath with exertion   O/Positive/-- (07/12 1105)  OB History     Gravida  12   Para  3   Term  2   Preterm  1   AB  8   Living  3      SAB  7   IAB  1   Ectopic  0   Multiple  0   Live Births  3        Obstetric Comments  Pre-E with both preg, induced with both         Past Medical History:  Diagnosis Date   Anemia    Asthma    Bipolar 1 disorder (HCC)    Blood transfusion without reported diagnosis    Chiari malformation type I (HCC)    Chronic post-traumatic stress disorder 09/19/2021   Glaucoma 09/19/2021   Heart murmur    History of postpartum hemorrhage    History of pulmonary embolism 2021   in setting of covid   Insomnia 09/19/2021   Migraine with typical aura 09/19/2021   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/2021   Pregnancy induced hypertension    Preterm labor    Sickle cell trait (HCC)    UTI (urinary tract infection)     Past Surgical History:  Procedure Laterality Date   CERVICAL BIOPSY  W/ LOOP ELECTRODE EXCISION     CERVICAL CERCLAGE     CESAREAN SECTION     CESAREAN SECTION N/A 12/30/2021   Procedure: CESAREAN SECTION;  Surgeon:  Hermina Staggers, MD;  Location: [facility] LD ORS;  Service: Obstetrics;  Laterality: N/A;   DILATION AND CURETTAGE OF UTERUS      Family History  Problem Relation Age of Onset   Stroke Mother    Heart disease Mother        hx open heart issues   Hypertension Mother    Diabetes Mother    Cancer Father        started as colon, w/mets   Kidney disease Father        renal failure   Diabetes Father    Cancer Maternal Grandmother    Diabetes Paternal Grandmother     Social History   Socioeconomic History   Marital status: Single    Spouse name: Not on file   Number of children: Not on file   Years of education: Not on file   Highest education level: Bachelor's degree (e.g., BA, AB, BS)  Occupational History   Not on file  Tobacco Use   Smoking status: Some Days  Current packs/day: 0.25    Types: Cigarettes   Smokeless tobacco: Never  Vaping Use   Vaping status: Some Days   Substances: Nicotine  Substance and Sexual Activity   Alcohol use: Not Currently   Drug use: Not Currently   Sexual activity: Yes    Birth control/protection: None  Other Topics Concern   Not on file  Social History Narrative   Not on file   Social Determinants of Health   Financial Resource Strain: High Risk (10/19/2021)   Overall Financial Resource Strain (CARDIA)    Difficulty of Paying Living Expenses: Very hard  Food Insecurity: No Food Insecurity (12/28/2021)   Hunger Vital Sign    Worried About Running Out of Food in the Last Year: Never true    Ran Out of Food in the Last Year: Never true  Recent Concern: Food Insecurity - Food Insecurity Present (10/19/2021)   Hunger Vital Sign    Worried About Running Out of Food in the Last Year: Sometimes true    Ran Out of Food in the Last Year: Sometimes true  Transportation Needs: No Transportation Needs (12/28/2021)   PRAPARE - Administrator, Civil Service (Medical): No    Lack of Transportation (Non-Medical): No  Recent Concern:  Transportation Needs - Unmet Transportation Needs (10/19/2021)   PRAPARE - Transportation    Lack of Transportation (Medical): Yes    Lack of Transportation (Non-Medical): Yes  Physical Activity: Insufficiently Active (10/19/2021)   Exercise Vital Sign    Days of Exercise per Week: 2 days    Minutes of Exercise per Session: 30 min  Stress: Stress Concern Present (10/19/2021)   [employer] of Occupational Health - Occupational Stress Questionnaire    Feeling of Stress : Rather much  Social Connections: Moderately Integrated (10/19/2021)   Social Connection and Isolation Panel [NHANES]    Frequency of Communication with Friends and Family: Once a week    Frequency of Social Gatherings with Friends and Family: Three times a week    Attends Religious Services: More than 4 times per year    Active Member of Clubs or Organizations: No    Attends Banker Meetings: Not on file    Marital Status: Living with partner  Intimate Partner Violence: Not At Risk (12/28/2021)   Humiliation, Afraid, Rape, and Kick questionnaire    Fear of Current or Ex-Partner: No    Emotionally Abused: No    Physically Abused: No    Sexually Abused: No    Allergies  Allergen Reactions   Iodinated Contrast Media Shortness Of Breath, Rash and Swelling   Latex Anaphylaxis   Shellfish Allergy Anaphylaxis   Iodine Hives   Penicillins Hives    No current facility-administered medications on file prior to encounter.   Current Outpatient Medications on File Prior to Encounter  Medication Sig Dispense Refill   acetaminophen (TYLENOL) 500 MG tablet Take 2 tablets (1,000 mg total) by mouth every 6 (six) hours. 30 tablet 0   albuterol (VENTOLIN HFA) 108 (90 Base) MCG/ACT inhaler Inhale 1-2 puffs into the lungs every 6 (six) hours as needed for wheezing or shortness of breath.     aspirin EC 81 MG tablet Take 2 tablets (162 mg total) by mouth daily. 180 tablet 3   enoxaparin (LOVENOX) 40 MG/0.4ML injection  Inject 0.4 mLs (40 mg total) into the skin daily. 12 mL 2   labetalol (NORMODYNE) 200 MG tablet Take 1 tablet (200 mg total) by mouth 2 (two)  times daily. 60 tablet 3   Prenatal MV & Min w/FA-DHA (PRENATAL GUMMIES PO) Take 2 tablets by mouth in the morning.     pantoprazole (PROTONIX) 40 MG tablet Take 1 tablet (40 mg total) by mouth daily. 30 tablet 0     ROS Pertinent positives and negative per HPI, all others reviewed and negative  Physical Exam   BP (!) 146/70   Pulse 83   Temp 97.6 F (36.4 C) (Oral)   Resp 18   SpO2 99%   Patient Vitals for the past 24 hrs:  BP Temp Temp src Pulse Resp SpO2  11/08/22 1115 (!) 146/70 -- -- 83 -- --  11/08/22 1100 130/75 -- -- 78 -- --  11/08/22 1044 (!) 143/76 97.6 F (36.4 C) Oral 81 18 99 %  11/08/22 1034 (!) 159/75 -- -- 80 -- 99 %    Physical Exam Vitals reviewed.  Constitutional:      General: She is not in acute distress.    Appearance: She is well-developed. She is not diaphoretic.  Eyes:     General: No scleral icterus. Cardiovascular:     Rate and Rhythm: Normal rate and regular rhythm.     Heart sounds: Normal heart sounds.  Pulmonary:     Effort: Pulmonary effort is normal. No respiratory distress.     Breath sounds: No wheezing or rales.  Abdominal:     General: There is no distension.     Palpations: Abdomen is soft.     Tenderness: There is no abdominal tenderness. There is no guarding or rebound.  Skin:    General: Skin is warm and dry.  Neurological:     Mental Status: She is alert.     Coordination: Coordination normal.      Cervical Exam    Bedside Ultrasound Not performed.  My interpretation: n/a  FHT 145 bpm by doppler  Labs Results for orders placed or performed during the hospital encounter of 11/08/22 (from the past 24 hour(s))  Resp panel by RT-PCR (RSV, Flu A&B, Covid) Anterior Nasal Swab     Status: None   Collection Time: 11/08/22 10:49 AM   Specimen: Anterior Nasal Swab  Result  Value Ref Range   SARS Coronavirus 2 by RT PCR NEGATIVE NEGATIVE   Influenza A by PCR NEGATIVE NEGATIVE   Influenza B by PCR NEGATIVE NEGATIVE   Resp Syncytial Virus by PCR NEGATIVE NEGATIVE  Urinalysis, Routine w reflex microscopic -Urine, Clean Catch     Status: Abnormal   Collection Time: 11/08/22 11:26 AM  Result Value Ref Range   Color, Urine YELLOW YELLOW   APPearance HAZY (A) CLEAR   Specific Gravity, Urine 1.004 (L) 1.005 - 1.030   pH 7.0 5.0 - 8.0   Glucose, UA NEGATIVE NEGATIVE mg/dL   Hgb urine dipstick NEGATIVE NEGATIVE   Bilirubin Urine NEGATIVE NEGATIVE   Ketones, ur NEGATIVE NEGATIVE mg/dL   Protein, ur NEGATIVE NEGATIVE mg/dL   Nitrite NEGATIVE NEGATIVE   Leukocytes,Ua NEGATIVE NEGATIVE  Comprehensive metabolic panel     Status: Abnormal   Collection Time: 11/08/22 11:26 AM  Result Value Ref Range   Sodium 135 135 - 145 mmol/L   Potassium 3.6 3.5 - 5.1 mmol/L   Chloride 105 98 - 111 mmol/L   CO2 18 (L) 22 - 32 mmol/L   Glucose, Bld 103 (H) 70 - 99 mg/dL   BUN <5 (L) 6 - 20 mg/dL   Creatinine, Ser 3.66 0.44 - 1.00 mg/dL  Calcium 9.1 8.9 - 10.3 mg/dL   Total Protein 6.1 (L) 6.5 - 8.1 g/dL   Albumin 3.1 (L) 3.5 - 5.0 g/dL   AST 13 (L) 15 - 41 U/L   ALT 9 0 - 44 U/L   Alkaline Phosphatase 69 38 - 126 U/L   Total Bilirubin 0.4 0.3 - 1.2 mg/dL   GFR, Estimated >59 >56 mL/min   Anion gap 12 5 - 15    Imaging No results found.  MAU Course  Procedures Lab Orders         Resp panel by RT-PCR (RSV, Flu A&B, Covid) Anterior Nasal Swab         Urinalysis, Routine w reflex microscopic -Urine, Clean Catch         Comprehensive metabolic panel    Meds ordered this encounter  Medications   promethazine (PHENERGAN) 25 mg in sodium chloride 0.9 % 1,000 mL infusion   ondansetron (ZOFRAN) injection 4 mg   acetaminophen (TYLENOL) tablet 1,000 mg   promethazine (PHENERGAN) 25 MG tablet    Sig: Take 1 tablet (25 mg total) by mouth every 6 (six) hours as needed for  nausea or vomiting.    Dispense:  30 tablet    Refill:  0   ondansetron (ZOFRAN-ODT) 4 MG disintegrating tablet    Sig: Take 1 tablet (4 mg total) by mouth every 6 (six) hours as needed for nausea.    Dispense:  20 tablet    Refill:  0   loperamide (IMODIUM A-D) 2 MG tablet    Sig: Take 1 tablet (2 mg total) by mouth 4 (four) times daily as needed for diarrhea or loose stools.    Dispense:  30 tablet    Refill:  0   Imaging Orders  No imaging studies ordered today    MDM Moderate (Level 3-4)  Assessment and Plan  # Viral URI, Nausea and vomiting of pregnancy #[redacted] weeks gestation of pregnancy COVID, flu, RSV swab all negative. Likely has viral URI. Lung exam unremarkable and vital signs reassuring. Given symptomatic treatment as above with improvement in symptoms. Rx sent for home meds and given work letter.   #FWB Normal FHR on doppler   Dispo: discharged to home in stable condition    Venora Maples, MD/MPH 11/08/22 1:22 PM  Allergies as of 11/08/2022       Reactions   Iodinated Contrast Media Shortness Of Breath, Rash, Swelling   Latex Anaphylaxis   Shellfish Allergy Anaphylaxis   Iodine Hives   Penicillins Hives        Medication List     TAKE these medications    Acetaminophen Extra Strength 500 MG Tabs Take 2 tablets (1,000 mg total) by mouth every 6 (six) hours.   albuterol 108 (90 Base) MCG/ACT inhaler Commonly known as: VENTOLIN HFA Inhale 1-2 puffs into the lungs every 6 (six) hours as needed for wheezing or shortness of breath.   aspirin EC 81 MG tablet Take 2 tablets (162 mg total) by mouth daily.   enoxaparin 40 MG/0.4ML injection Commonly known as: LOVENOX Inject 0.4 mLs (40 mg total) into the skin daily.   labetalol 200 MG tablet Commonly known as: NORMODYNE Take 1 tablet (200 mg total) by mouth 2 (two) times daily.   loperamide 2 MG tablet Commonly known as: Imodium A-D Take 1 tablet (2 mg total) by mouth 4 (four) times daily as  needed for diarrhea or loose stools.   ondansetron 4 MG disintegrating tablet Commonly  known as: ZOFRAN-ODT Take 1 tablet (4 mg total) by mouth every 6 (six) hours as needed for nausea.   pantoprazole 40 MG tablet Commonly known as: Protonix Take 1 tablet (40 mg total) by mouth daily.   PRENATAL GUMMIES PO Take 2 tablets by mouth in the morning.   promethazine 25 MG tablet Commonly known as: PHENERGAN Take 1 tablet (25 mg total) by mouth every 6 (six) hours as needed for nausea or vomiting.

## 2022-11-08 NOTE — Progress Notes (Signed)
Blood Pressure Check Visit  Lydianna Giangrande is here for blood pressure check at 19w GA today. BP today is 133/64. Patient endorses dizzness, headaches that dont resolve with medication, chest pain and heartburn, elevated BP measurement (in 160s to 170s systolic x2 days per patient), shortness of breath,  peripheral edema (in hands especially per patient) x3 days along with n/v/d x3 days.    Reviewed with Dr. Catalina Antigua. Provider rx'd Protonix 40 mg daily for heartburn, but d/t other symptoms that patient has been experiencing, provider recommended sending patient to MAU for evaluation. Informed patient of plan and patient verbalized understanding as well as agreed to head to MAU after end of this appointment.   RN called MAU for report.   Meryl Crutch, RN 11/08/2022  9:02 AM

## 2022-11-09 ENCOUNTER — Ambulatory Visit (HOSPITAL_COMMUNITY): Payer: No Typology Code available for payment source

## 2022-11-10 ENCOUNTER — Ambulatory Visit: Payer: No Typology Code available for payment source | Admitting: *Deleted

## 2022-11-10 ENCOUNTER — Ambulatory Visit: Payer: No Typology Code available for payment source

## 2022-11-10 ENCOUNTER — Ambulatory Visit: Payer: No Typology Code available for payment source | Attending: Cardiology

## 2022-11-10 ENCOUNTER — Other Ambulatory Visit: Payer: Self-pay | Admitting: *Deleted

## 2022-11-10 VITALS — BP 129/74 | HR 76

## 2022-11-10 DIAGNOSIS — O99212 Obesity complicating pregnancy, second trimester: Secondary | ICD-10-CM

## 2022-11-10 DIAGNOSIS — O2622 Pregnancy care for patient with recurrent pregnancy loss, second trimester: Secondary | ICD-10-CM

## 2022-11-10 DIAGNOSIS — Z3A19 19 weeks gestation of pregnancy: Secondary | ICD-10-CM

## 2022-11-10 DIAGNOSIS — Z8759 Personal history of other complications of pregnancy, childbirth and the puerperium: Secondary | ICD-10-CM | POA: Insufficient documentation

## 2022-11-10 DIAGNOSIS — Z86711 Personal history of pulmonary embolism: Secondary | ICD-10-CM | POA: Diagnosis present

## 2022-11-10 DIAGNOSIS — O3442 Maternal care for other abnormalities of cervix, second trimester: Secondary | ICD-10-CM

## 2022-11-10 DIAGNOSIS — O10912 Unspecified pre-existing hypertension complicating pregnancy, second trimester: Secondary | ICD-10-CM | POA: Insufficient documentation

## 2022-11-10 DIAGNOSIS — O99332 Smoking (tobacco) complicating pregnancy, second trimester: Secondary | ICD-10-CM

## 2022-11-10 DIAGNOSIS — O285 Abnormal chromosomal and genetic finding on antenatal screening of mother: Secondary | ICD-10-CM

## 2022-11-10 DIAGNOSIS — O34219 Maternal care for unspecified type scar from previous cesarean delivery: Secondary | ICD-10-CM

## 2022-11-10 DIAGNOSIS — E669 Obesity, unspecified: Secondary | ICD-10-CM

## 2022-11-10 DIAGNOSIS — O10012 Pre-existing essential hypertension complicating pregnancy, second trimester: Secondary | ICD-10-CM

## 2022-11-10 DIAGNOSIS — Z3689 Encounter for other specified antenatal screening: Secondary | ICD-10-CM

## 2022-11-10 DIAGNOSIS — F17210 Nicotine dependence, cigarettes, uncomplicated: Secondary | ICD-10-CM

## 2022-11-10 DIAGNOSIS — O09213 Supervision of pregnancy with history of pre-term labor, third trimester: Secondary | ICD-10-CM

## 2022-11-10 DIAGNOSIS — Z141 Cystic fibrosis carrier: Secondary | ICD-10-CM

## 2022-11-10 NOTE — Progress Notes (Deleted)
Office Visit    Patient Name: Kimberly Stark Date of Encounter: 11/10/2022  Primary Care Provider:  Default, Provider, MD Primary Cardiologist:  Thomasene Ripple, DO  Chief Complaint    Hypertension in pregnancy - [redacted]w[redacted]d  (due 04/04/23)  Significant Past Medical History   Pre-eclampsia In all previous pregnancies  HELLP  In first pregnancy  PE In setting of COVID (2021) currently on Lovenox  Tobacco abuse   preDM 7/24 A1c 5.8; glucose tolerance test WNL 10/06/22  Chiari malformation     Allergies  Allergen Reactions   Iodinated Contrast Media Shortness Of Breath, Rash and Swelling   Latex Anaphylaxis   Shellfish Allergy Anaphylaxis   Iodine Hives   Penicillins Hives    History of Present Illness    Kimberly Stark is a 32 y.o. female patient of Dr Servando [place], in the office today for Cardio-OB hypertension follow up.  Patient currently at [redacted]w[redacted]d and was last seen by Dr. Servando [place] 3 weeks ago with a BP of 142/78.  Nifedipine 30 mg was added.  Blood Pressure Goal:  130/80  Current Medications:  nifedipine xl 30 mg every day, labetalol 200 mg bid  Aspirin 81 mg every day , enoxaparin 40 mg every day   Previously tried:    Family Hx: father (CABG in his 56's, CKD, DM, colon cancer); mother (CABG in her 87's, HTN, DM, stroke)  Social Hx:      Tobacco:  Alcohol:  Caffeine:  Diet:      Exercise:   Home BP readings:      Adherence Assessment  Do you ever forget to take your medication? [] Yes [] No  Do you ever skip doses due to side effects? [] Yes [] No  Do you have trouble affording your medicines? [] Yes [] No  Are you ever unable to pick up your medication due to transportation difficulties? [] Yes [] No  Do you ever stop taking your medications because you don't believe they are helping? [] Yes [] No  Do you check your weight daily? [] Yes [] No   Adherence strategy: ***  Barriers to obtaining medications: ***     Accessory Clinical Findings    Lab Results  Component Value  Date   CREATININE 0.58 11/08/2022   BUN <5 (L) 11/08/2022   NA 135 11/08/2022   K 3.6 11/08/2022   CL 105 11/08/2022   CO2 18 (L) 11/08/2022   Lab Results  Component Value Date   ALT 9 11/08/2022   AST 13 (L) 11/08/2022   ALKPHOS 69 11/08/2022   BILITOT 0.4 11/08/2022   Lab Results  Component Value Date   HGBA1C 5.8 (H) 09/22/2022    Home Medications    Current Outpatient Medications  Medication Sig Dispense Refill   acetaminophen (TYLENOL) 500 MG tablet Take 2 tablets (1,000 mg total) by mouth every 6 (six) hours. 30 tablet 0   albuterol (VENTOLIN HFA) 108 (90 Base) MCG/ACT inhaler Inhale 1-2 puffs into the lungs every 6 (six) hours as needed for wheezing or shortness of breath.     aspirin EC 81 MG tablet Take 2 tablets (162 mg total) by mouth daily. 180 tablet 3   enoxaparin (LOVENOX) 40 MG/0.4ML injection Inject 0.4 mLs (40 mg total) into the skin daily. 12 mL 2   labetalol (NORMODYNE) 200 MG tablet Take 1 tablet (200 mg total) by mouth 2 (two) times daily. 60 tablet 3   loperamide (IMODIUM A-D) 2 MG tablet Take 1 tablet (2 mg total) by mouth 4 (four) times daily as needed for  diarrhea or loose stools. 30 tablet 0   ondansetron (ZOFRAN-ODT) 4 MG disintegrating tablet Take 1 tablet (4 mg total) by mouth every 6 (six) hours as needed for nausea. 20 tablet 0   pantoprazole (PROTONIX) 40 MG tablet Take 1 tablet (40 mg total) by mouth daily. 30 tablet 0   Prenatal MV & Min w/FA-DHA (PRENATAL GUMMIES PO) Take 2 tablets by mouth in the morning.     promethazine (PHENERGAN) 25 MG tablet Take 1 tablet (25 mg total) by mouth every 6 (six) hours as needed for nausea or vomiting. 30 tablet 0   No current facility-administered medications for this visit.     No BP recorded.  {Refresh Note OR Click here to enter BP  :1}***   Assessment & Plan    No problem-specific Assessment & Plan notes found for this encounter.   Phillips Hay PharmD CPP [facility]  [address] [place], [place] [address] [phone]

## 2022-11-14 ENCOUNTER — Ambulatory Visit: Payer: No Typology Code available for payment source

## 2022-11-23 ENCOUNTER — Encounter: Payer: No Typology Code available for payment source | Admitting: Obstetrics and Gynecology

## 2022-11-24 ENCOUNTER — Encounter: Payer: Self-pay | Admitting: Pharmacist Clinician (PhC)/ Clinical Pharmacy Specialist

## 2022-11-24 ENCOUNTER — Ambulatory Visit
Payer: No Typology Code available for payment source | Attending: Cardiology | Admitting: Pharmacist Clinician (PhC)/ Clinical Pharmacy Specialist

## 2022-11-24 VITALS — BP 134/79 | HR 74

## 2022-11-24 DIAGNOSIS — O10919 Unspecified pre-existing hypertension complicating pregnancy, unspecified trimester: Secondary | ICD-10-CM | POA: Diagnosis not present

## 2022-11-24 DIAGNOSIS — Z3A21 21 weeks gestation of pregnancy: Secondary | ICD-10-CM

## 2022-11-24 MED ORDER — LABETALOL HCL 200 MG PO TABS: 200.0000 mg | ORAL_TABLET | Freq: Three times a day (TID) | ORAL | 1 refills | Status: DC

## 2022-11-24 NOTE — Patient Instructions (Signed)
Follow up appointment: with Dr. Servando Salina on October 2  Take your BP meds as follows:  Increase labetalol to 200 mg three times per day  Check your blood pressure at home daily (if able) and keep record of the readings.  Hypertension "High blood pressure"  Hypertension is often called "The Silent Killer." It rarely causes symptoms until it is extremely  high or has done damage to other organs in the body. For this reason, you should have your  blood pressure checked regularly by your physician. We will check your blood pressure  every time you see a provider at one of our offices.   Your blood pressure reading consists of two numbers. Ideally, blood pressure should be  below 120/80. The first ("top") number is called the systolic pressure. It measures the  pressure in your arteries as your heart beats. The second ("bottom") number is called the diastolic pressure. It measures the pressure in your arteries as the heart relaxes between beats.  The benefits of getting your blood pressure under control are enormous. A 10-point  reduction in systolic blood pressure can reduce your risk of stroke by 27% and heart failure by 28%  Your blood pressure goal is 130/80  To check your pressure at home you will need to:  1. Sit up in a chair, with feet flat on the floor and back supported. Do not cross your ankles or legs. 2. Rest your left arm so that the cuff is about heart level. If the cuff goes on your upper arm,  then just relax the arm on the table, arm of the chair or your lap. If you have a wrist cuff, we  suggest relaxing your wrist against your chest (think of it as Pledging the Flag with the  wrong arm).  3. Place the cuff snugly around your arm, about 1 inch above the crook of your elbow. The  cords should be inside the groove of your elbow.  4. Sit quietly, with the cuff in place, for about 5 minutes. After that 5 minutes press the power  button to start a reading. 5. Do not talk or  move while the reading is taking place.  6. Record your readings on a sheet of paper. Although most cuffs have a memory, it is often  easier to see a pattern developing when the numbers are all in front of you.  7. You can repeat the reading after 1-3 minutes if it is recommended  Make sure your bladder is empty and you have not had caffeine or tobacco within the last 30 min  Always bring your blood pressure log with you to your appointments. If you have not brought your monitor in to be double checked for accuracy, please bring it to your next appointment.  You can find a list of quality blood pressure cuffs at validatebp.org

## 2022-11-24 NOTE — Progress Notes (Signed)
Office Visit    Patient Name: Kimberly Stark Date of Encounter: 11/24/2022  Primary Care Provider:  Default, Provider, MD Primary Cardiologist:  Thomasene Ripple, DO  Chief Complaint    Hypertension in pregnancy - [redacted]w[redacted]d  (due 04/04/23)  Significant Past Medical History   Pre-eclampsia In all previous pregnancies  HELLP  In first pregnancy  PE In setting of COVID (2021) currently on Lovenox  Tobacco abuse Currently 3-4 cigarettes per day  preDM 7/24 A1c 5.8; glucose tolerance test WNL 10/06/22  Chiari malformation     Allergies  Allergen Reactions   Iodinated Contrast Media Shortness Of Breath, Rash and Swelling   Latex Anaphylaxis   Shellfish Allergy Anaphylaxis   Iodine Hives   Penicillins Hives    History of Present Illness    Kimberly Stark is a 32 y.o. female patient of Dr Servando Salina, in the office today for Cardio-OB hypertension follow up.  Patient currently at [redacted]w[redacted]d and was last seen by Dr. Servando Salina 3 weeks ago with a BP of 142/78.   She notes that she had pre-eclampsia in her previous pregnancies, although she was further along before problems began to develop.  Today she notes that she does have some swelling, is wearing compression socks daily, but did have to buy larger shoes to stay comfortable.  Says that she will have 2+ pitting edema if she doesn't wear the socks.    Notes that her BP tends to be elevated throughout the day, except for the few hours after each dose of labetalol.  Says that in previous pregnancies she took labetalol three times daily for best BP control.   Dr. Servando Salina had added nifedipine xl 30 mg back at her last visit,  but she did not, as she notes that it caused her to have headaches.    Blood Pressure Goal:  130/80  Current Medications:  labetalol 200 mg bid  Aspirin 81 mg every day , enoxaparin 40 mg every day   Previously tried:    Family Hx: father (CABG in his 18's, CKD, DM, colon cancer deceased); mother (CABG in her 38's, HTN, DM, strokes x  7)  Social Hx:      Tobacco:  trying to quit - down to 3/day  Alcohol:  no  Caffeine:  mushroom coffee  Diet:   mushroom coffee gives boost without caffeine ; [employer] salads - we stopped to discuss sodium content of these - > 1,000 mg per salad in most - she wasn't aware;  no red meat, dairy free   Exercise: walks > 10,000 at work   Home BP readings: BP spiking between 2-3 pm,  most home readings 150-160 mid day, AM can be higher then down to 120-130 an hour after meds.  Lasts 3-4 hours before trending up again   Accessory Clinical Findings    Lab Results  Component Value Date   CREATININE 0.58 11/08/2022   BUN <5 (L) 11/08/2022   NA 135 11/08/2022   K 3.6 11/08/2022   CL 105 11/08/2022   CO2 18 (L) 11/08/2022   Lab Results  Component Value Date   ALT 9 11/08/2022   AST 13 (L) 11/08/2022   ALKPHOS 69 11/08/2022   BILITOT 0.4 11/08/2022   Lab Results  Component Value Date   HGBA1C 5.8 (H) 09/22/2022    Home Medications    Current Outpatient Medications  Medication Sig Dispense Refill   labetalol (NORMODYNE) 200 MG tablet Take 1 tablet (200 mg total) by mouth  3 (three) times daily. 270 tablet 1   acetaminophen (TYLENOL) 500 MG tablet Take 2 tablets (1,000 mg total) by mouth every 6 (six) hours. 30 tablet 0   albuterol (VENTOLIN HFA) 108 (90 Base) MCG/ACT inhaler Inhale 1-2 puffs into the lungs every 6 (six) hours as needed for wheezing or shortness of breath.     aspirin EC 81 MG tablet Take 2 tablets (162 mg total) by mouth daily. 180 tablet 3   enoxaparin (LOVENOX) 40 MG/0.4ML injection Inject 0.4 mLs (40 mg total) into the skin daily. 12 mL 2   loperamide (IMODIUM A-D) 2 MG tablet Take 1 tablet (2 mg total) by mouth 4 (four) times daily as needed for diarrhea or loose stools. 30 tablet 0   ondansetron (ZOFRAN-ODT) 4 MG disintegrating tablet Take 1 tablet (4 mg total) by mouth every 6 (six) hours as needed for nausea. 20 tablet 0   pantoprazole (PROTONIX) 40 MG  tablet Take 1 tablet (40 mg total) by mouth daily. 30 tablet 0   Prenatal MV & Min w/FA-DHA (PRENATAL GUMMIES PO) Take 2 tablets by mouth in the morning.     promethazine (PHENERGAN) 25 MG tablet Take 1 tablet (25 mg total) by mouth every 6 (six) hours as needed for nausea or vomiting. 30 tablet 0   No current facility-administered medications for this visit.        Assessment & Plan    Chronic hypertension affecting pregnancy Patient currently [redacted]w[redacted]d with her fourth child.  Has had preeclampsia in previous pregnancies as well as HELLP with first.  Concerned because feels symptoms are coming earlier in this pregnancy than in previous.    Increase labetalol to 200 mg three times daily and keep track of BP readings at home for next few weeks.    Follow up with Dr. Servando Salina in 3 weeks     Phillips Hay PharmD CPP [facility]  [address] [place], [place] [address] [phone]

## 2022-11-24 NOTE — Assessment & Plan Note (Signed)
Patient currently [redacted]w[redacted]d with her fourth child.  Has had preeclampsia in previous pregnancies as well as HELLP with first.  Concerned because feels symptoms are coming earlier in this pregnancy than in previous.    Increase labetalol to 200 mg three times daily and keep track of BP readings at home for next few weeks.    Follow up with Dr. Servando Salina in 3 weeks

## 2022-11-26 ENCOUNTER — Encounter: Payer: Self-pay | Admitting: Family Medicine

## 2022-11-26 ENCOUNTER — Other Ambulatory Visit: Payer: Self-pay

## 2022-11-26 ENCOUNTER — Encounter (HOSPITAL_COMMUNITY): Payer: Self-pay | Admitting: Obstetrics and Gynecology

## 2022-11-26 ENCOUNTER — Inpatient Hospital Stay (HOSPITAL_COMMUNITY)
Admission: AD | Admit: 2022-11-26 | Discharge: 2022-11-26 | Disposition: A | Discharge status: Home / Self Care | Payer: No Typology Code available for payment source | Attending: Obstetrics and Gynecology | Admitting: Obstetrics and Gynecology

## 2022-11-26 ENCOUNTER — Encounter: Payer: Self-pay | Admitting: Obstetrics and Gynecology

## 2022-11-26 ENCOUNTER — Inpatient Hospital Stay (HOSPITAL_BASED_OUTPATIENT_CLINIC_OR_DEPARTMENT_OTHER): Payer: No Typology Code available for payment source

## 2022-11-26 DIAGNOSIS — R1033 Periumbilical pain: Secondary | ICD-10-CM | POA: Insufficient documentation

## 2022-11-26 DIAGNOSIS — O99012 Anemia complicating pregnancy, second trimester: Secondary | ICD-10-CM

## 2022-11-26 DIAGNOSIS — O99342 Other mental disorders complicating pregnancy, second trimester: Secondary | ICD-10-CM

## 2022-11-26 DIAGNOSIS — Z7901 Long term (current) use of anticoagulants: Secondary | ICD-10-CM | POA: Insufficient documentation

## 2022-11-26 DIAGNOSIS — F17210 Nicotine dependence, cigarettes, uncomplicated: Secondary | ICD-10-CM | POA: Diagnosis not present

## 2022-11-26 DIAGNOSIS — Z8759 Personal history of other complications of pregnancy, childbirth and the puerperium: Secondary | ICD-10-CM | POA: Diagnosis not present

## 2022-11-26 DIAGNOSIS — F319 Bipolar disorder, unspecified: Secondary | ICD-10-CM | POA: Diagnosis not present

## 2022-11-26 DIAGNOSIS — Y92008 Other place in unspecified non-institutional (private) residence as the place of occurrence of the external cause: Secondary | ICD-10-CM | POA: Diagnosis not present

## 2022-11-26 DIAGNOSIS — O99891 Other specified diseases and conditions complicating pregnancy: Secondary | ICD-10-CM | POA: Diagnosis not present

## 2022-11-26 DIAGNOSIS — O9A212 Injury, poisoning and certain other consequences of external causes complicating pregnancy, second trimester: Secondary | ICD-10-CM | POA: Insufficient documentation

## 2022-11-26 DIAGNOSIS — O09212 Supervision of pregnancy with history of pre-term labor, second trimester: Secondary | ICD-10-CM | POA: Diagnosis not present

## 2022-11-26 DIAGNOSIS — R109 Unspecified abdominal pain: Secondary | ICD-10-CM

## 2022-11-26 DIAGNOSIS — O99332 Smoking (tobacco) complicating pregnancy, second trimester: Secondary | ICD-10-CM | POA: Diagnosis not present

## 2022-11-26 DIAGNOSIS — W108XXA Fall (on) (from) other stairs and steps, initial encounter: Secondary | ICD-10-CM | POA: Insufficient documentation

## 2022-11-26 DIAGNOSIS — M5489 Other dorsalgia: Secondary | ICD-10-CM | POA: Diagnosis not present

## 2022-11-26 DIAGNOSIS — Z148 Genetic carrier of other disease: Secondary | ICD-10-CM | POA: Insufficient documentation

## 2022-11-26 DIAGNOSIS — Z3A21 21 weeks gestation of pregnancy: Secondary | ICD-10-CM

## 2022-11-26 DIAGNOSIS — O26852 Spotting complicating pregnancy, second trimester: Secondary | ICD-10-CM | POA: Diagnosis not present

## 2022-11-26 DIAGNOSIS — O26892 Other specified pregnancy related conditions, second trimester: Secondary | ICD-10-CM | POA: Diagnosis not present

## 2022-11-26 DIAGNOSIS — O34219 Maternal care for unspecified type scar from previous cesarean delivery: Secondary | ICD-10-CM

## 2022-11-26 DIAGNOSIS — O99212 Obesity complicating pregnancy, second trimester: Secondary | ICD-10-CM | POA: Diagnosis not present

## 2022-11-26 DIAGNOSIS — J45909 Unspecified asthma, uncomplicated: Secondary | ICD-10-CM | POA: Diagnosis not present

## 2022-11-26 DIAGNOSIS — E669 Obesity, unspecified: Secondary | ICD-10-CM

## 2022-11-26 DIAGNOSIS — O10012 Pre-existing essential hypertension complicating pregnancy, second trimester: Secondary | ICD-10-CM | POA: Diagnosis not present

## 2022-11-26 DIAGNOSIS — O2622 Pregnancy care for patient with recurrent pregnancy loss, second trimester: Secondary | ICD-10-CM | POA: Diagnosis not present

## 2022-11-26 DIAGNOSIS — O3442 Maternal care for other abnormalities of cervix, second trimester: Secondary | ICD-10-CM | POA: Insufficient documentation

## 2022-11-26 DIAGNOSIS — O09292 Supervision of pregnancy with other poor reproductive or obstetric history, second trimester: Secondary | ICD-10-CM | POA: Diagnosis not present

## 2022-11-26 DIAGNOSIS — D649 Anemia, unspecified: Secondary | ICD-10-CM

## 2022-11-26 DIAGNOSIS — T1490XA Injury, unspecified, initial encounter: Secondary | ICD-10-CM

## 2022-11-26 DIAGNOSIS — O99340 Other mental disorders complicating pregnancy, unspecified trimester: Secondary | ICD-10-CM | POA: Diagnosis not present

## 2022-11-26 LAB — COMPREHENSIVE METABOLIC PANEL
ALT: 10 U/L (ref 0–44)
AST: 13 U/L — ABNORMAL LOW (ref 15–41)
Albumin: 3.2 g/dL — ABNORMAL LOW (ref 3.5–5.0)
Alkaline Phosphatase: 62 U/L (ref 38–126)
Anion gap: 9 (ref 5–15)
BUN: 5 mg/dL — ABNORMAL LOW (ref 6–20)
CO2: 22 mmol/L (ref 22–32)
Calcium: 9.1 mg/dL (ref 8.9–10.3)
Chloride: 103 mmol/L (ref 98–111)
Creatinine, Ser: 0.57 mg/dL (ref 0.44–1.00)
GFR, Estimated: >60 mL/min (ref >60)
Glucose, Bld: 87 mg/dL (ref 70–99)
Potassium: 3.1 mmol/L — ABNORMAL LOW (ref 3.5–5.1)
Sodium: 134 mmol/L — ABNORMAL LOW (ref 135–145)
Total Bilirubin: 0.7 mg/dL (ref 0.3–1.2)
Total Protein: 6.1 g/dL — ABNORMAL LOW (ref 6.5–8.1)

## 2022-11-26 LAB — CBC
HCT: 34.4 % — ABNORMAL LOW (ref 36.0–46.0)
Hemoglobin: 11.3 g/dL — ABNORMAL LOW (ref 12.0–15.0)
MCH: 28.7 pg (ref 26.0–34.0)
MCHC: 32.8 g/dL (ref 30.0–36.0)
MCV: 87.3 fL (ref 80.0–100.0)
Platelets: 375 10*3/uL (ref 150–400)
RBC: 3.94 MIL/uL (ref 3.87–5.11)
RDW: 12.7 % (ref 11.5–15.5)
WBC: 10.3 10*3/uL (ref 4.0–10.5)
nRBC: 0 % (ref 0.0–0.2)

## 2022-11-26 LAB — URINALYSIS, MICROSCOPIC (REFLEX)

## 2022-11-26 LAB — URINALYSIS, ROUTINE W REFLEX MICROSCOPIC
Glucose, UA: NEGATIVE mg/dL
Hgb urine dipstick: NEGATIVE
Ketones, ur: 40 mg/dL — AB
Leukocytes,Ua: NEGATIVE
Nitrite: NEGATIVE
Protein, ur: 30 mg/dL — AB
Specific Gravity, Urine: 1.025 (ref 1.005–1.030)
pH: 6.5 (ref 5.0–8.0)

## 2022-11-26 NOTE — MAU Note (Addendum)
Kimberly Stark is a 32 y.o. at [redacted]w[redacted]d here in MAU reporting: falling down her steps at 0200-0300. States she made it half way down and rolled down the rest and hit her front door. Patient went back to bed since she was not hurting. Patient woke up around 0900 and noticed right sided pain, burning pain around umbilicus, and sharp pain that radiates to her back. Tylenol 1000mg  at 0600. States when she went to the bathroom she noticed vaginal red spotting when she wiped. States she has not felt fetal movement since the fall.  Patient on lovenox due to clotting issues with previous pregnancy. Last dose yesterday 0830.    Onset of complaint: 0200-0300 Pain score: abd 6 back 6 Vitals:   11/26/22 1037  BP: 137/67  Pulse: 88  Resp: 17  Temp: 98.2 F (36.8 C)     FHT:145 Lab orders placed from triage:  UA

## 2022-11-26 NOTE — MAU Provider Note (Signed)
History     CSN: [id number]  Arrival date and time: 11/26/22 1022   None     Chief Complaint  Patient presents with   Fall   Abdominal Pain   Back Pain   HPI Kimberly Stark is a 32 y.o. [id number] at [redacted]w[redacted]d w history of pre-eclampsia in prev preg, HELLP in prev preg, cervical insufficiency (declined cerclage this preg and has been monitored), h/o PE on Lovenox, recurrent SAB, GERD, tobacco use disorder, and obesity presenting today after a fall. She fell around 0200-0300 today -- has approx 24 carpeted steps in her house and was trying not to turn lights on when she went downstairs for a snack. She was down about 5 steps and fell/tumbled the remainder of the steps. She hit her front door in front of her steps on the right side of her abdomen. She took Tylenol around 0600 for a burning/sharp pain she had that started around her right mid abdomen that radiates to her back. Describes this pain as intermittent in nature. She states in past, she has had back pain only with contractions and is not feeling that pain at present. She noticed earlier this morning when she went to the bathroom she had scant pinkish to bright red spotting with wiping, but no frank blood. She has felt some fetal movement since her fall. No other injuries that she is aware of, no pain anywhere else. No leaking of fluid.   Past Medical History:  Diagnosis Date   Anemia    Asthma    Bipolar 1 disorder (HCC)    Blood transfusion without reported diagnosis    Chiari malformation type I (HCC)    Chronic post-traumatic stress disorder 09/19/2021   Glaucoma 09/19/2021   Heart murmur    History of postpartum hemorrhage    History of pulmonary embolism 2021   in setting of covid   Insomnia 09/19/2021   Migraine with typical aura 09/19/2021   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/2021   Pregnancy induced hypertension    Preterm labor    Sickle cell trait (HCC)    UTI (urinary tract infection)     Past Surgical History:   Procedure Laterality Date   CERVICAL BIOPSY  W/ LOOP ELECTRODE EXCISION     CERVICAL CERCLAGE     CESAREAN SECTION     CESAREAN SECTION N/A 12/30/2021   Procedure: CESAREAN SECTION;  Surgeon: Hermina Staggers, MD;  Location: [facility] LD ORS;  Service: Obstetrics;  Laterality: N/A;   DILATION AND CURETTAGE OF UTERUS      Family History  Problem Relation Age of Onset   Stroke Mother    Heart disease Mother        hx open heart issues   Hypertension Mother    Diabetes Mother    Cancer Father        started as colon, w/mets   Kidney disease Father        renal failure   Diabetes Father    Cancer Maternal Grandmother    Diabetes Paternal Grandmother     Social History   Tobacco Use   Smoking status: Some Days    Current packs/day: 0.25    Types: Cigarettes   Smokeless tobacco: Never  Vaping Use   Vaping status: Former   Substances: Nicotine  Substance Use Topics   Alcohol use: Not Currently   Drug use: Not Currently    Allergies:  Allergies  Allergen Reactions   Iodinated Contrast Media Shortness  Of Breath, Rash and Swelling   Latex Anaphylaxis   Shellfish Allergy Anaphylaxis   Iodine Hives   Penicillins Hives    Medications Prior to Admission  Medication Sig Dispense Refill Last Dose   acetaminophen (TYLENOL) 500 MG tablet Take 2 tablets (1,000 mg total) by mouth every 6 (six) hours. 30 tablet 0 11/26/2022   albuterol (VENTOLIN HFA) 108 (90 Base) MCG/ACT inhaler Inhale 1-2 puffs into the lungs every 6 (six) hours as needed for wheezing or shortness of breath.   11/25/2022   aspirin EC 81 MG tablet Take 2 tablets (162 mg total) by mouth daily. 180 tablet 3 11/25/2022   enoxaparin (LOVENOX) 40 MG/0.4ML injection Inject 0.4 mLs (40 mg total) into the skin daily. 12 mL 2 11/25/2022   labetalol (NORMODYNE) 200 MG tablet Take 1 tablet (200 mg total) by mouth 3 (three) times daily. 270 tablet 1 11/26/2022   ondansetron (ZOFRAN-ODT) 4 MG disintegrating tablet Take 1 tablet (4 mg  total) by mouth every 6 (six) hours as needed for nausea. 20 tablet 0 Past Month   pantoprazole (PROTONIX) 40 MG tablet Take 1 tablet (40 mg total) by mouth daily. 30 tablet 0 11/26/2022   Prenatal MV & Min w/FA-DHA (PRENATAL GUMMIES PO) Take 2 tablets by mouth in the morning.   11/26/2022   promethazine (PHENERGAN) 25 MG tablet Take 1 tablet (25 mg total) by mouth every 6 (six) hours as needed for nausea or vomiting. 30 tablet 0 Past Month   loperamide (IMODIUM A-D) 2 MG tablet Take 1 tablet (2 mg total) by mouth 4 (four) times daily as needed for diarrhea or loose stools. 30 tablet 0 Unknown    ROS performed and pertinent positives and negatives are as documented in HPI. Physical Exam   Blood pressure (!) 133/55, pulse 86, temperature 98.2 F (36.8 C), temperature source Oral, resp. rate 17, height 5\' 4"  (1.626 m), weight 92.2 kg, SpO2 99%, currently breastfeeding.  Physical Exam Constitutional:      General: She is not in acute distress.    Appearance: She is well-developed. She is not ill-appearing.  HENT:     Head: Normocephalic and atraumatic.  Cardiovascular:     Rate and Rhythm: Normal rate and regular rhythm.     Heart sounds: Murmur (systolic ejection murmur) heard.  Pulmonary:     Effort: Pulmonary effort is normal.     Breath sounds: Normal breath sounds.  Abdominal:     Palpations: Abdomen is soft.     Tenderness: There is abdominal tenderness in the right lower quadrant and suprapubic area. There is no guarding or rebound.     Comments: gravid  Skin:    General: Skin is warm and dry.     Findings: No rash.  Neurological:     General: No focal deficit present.     Mental Status: She is alert and oriented to person, place, and time.     MAU Course  Procedures  MDM 32 y.o. [id number] at [redacted]w[redacted]d here following a fall down 19 steps and collision with front door around 0200-0300 today. No other injuries, but feeling intermittent sharp, burning abd pain from mid to low abd  to back and having some spotting w wiping. Has good FM still. Vital signs stable, exam notable for tenderness with palpation of right side of abdomen throughout but not rigid and no guarding. Will watch toco to eval for ctx and order U/S to eval for any evidence of abruption and eval CL.  1:19 PM Prelim U/S -- ant placenta, no mention of abruption, FHR 145, AF wnl, CL 4.4 cm. No ctx on toco. Patient reports pain has not been happening as frequently. Discussed U/S results with patient in detail as well as return precautions. Stable for d/c.  Assessment and Plan  Abdominal pain in pregnancy, second trimester  Blunt trauma - Plan: Discharge patient U/S not c/f abruption, no ctx on toco  likely pain 2/2 soft tissue injury from fall, less likely abruption  plan and return precautions d/w pt, stable for d/c.    Patient discussed with Dr. Ivin Booty, MD OB Fellow, Faculty [facility]  11/26/2022, 11:22 AM

## 2022-11-27 LAB — ABO/RH: ABO/RH(D): O POS

## 2022-11-28 ENCOUNTER — Ambulatory Visit (HOSPITAL_COMMUNITY): Payer: No Typology Code available for payment source

## 2022-11-28 ENCOUNTER — Other Ambulatory Visit: Payer: Self-pay | Admitting: Pharmacist Clinician (PhC)/ Clinical Pharmacy Specialist

## 2022-11-28 MED ORDER — LABETALOL HCL 200 MG PO TABS: 200.0000 mg | ORAL_TABLET | Freq: Three times a day (TID) | ORAL | 1 refills | Status: DC

## 2022-11-29 ENCOUNTER — Encounter: Payer: Self-pay | Admitting: Family Medicine

## 2022-11-29 ENCOUNTER — Ambulatory Visit: Payer: No Typology Code available for payment source | Admitting: *Deleted

## 2022-11-29 ENCOUNTER — Other Ambulatory Visit: Payer: Self-pay | Admitting: Obstetrics and Gynecology

## 2022-11-29 ENCOUNTER — Ambulatory Visit: Payer: No Typology Code available for payment source | Attending: Obstetrics and Gynecology

## 2022-11-29 ENCOUNTER — Ambulatory Visit: Payer: No Typology Code available for payment source | Attending: Obstetrics

## 2022-11-29 ENCOUNTER — Other Ambulatory Visit: Payer: Self-pay | Admitting: *Deleted

## 2022-11-29 ENCOUNTER — Ambulatory Visit
Payer: No Typology Code available for payment source | Attending: Obstetrics and Gynecology | Admitting: Maternal & Fetal Medicine

## 2022-11-29 VITALS — BP 125/60 | HR 79

## 2022-11-29 DIAGNOSIS — O99212 Obesity complicating pregnancy, second trimester: Secondary | ICD-10-CM

## 2022-11-29 DIAGNOSIS — O99891 Other specified diseases and conditions complicating pregnancy: Secondary | ICD-10-CM | POA: Diagnosis not present

## 2022-11-29 DIAGNOSIS — Z3A22 22 weeks gestation of pregnancy: Secondary | ICD-10-CM

## 2022-11-29 DIAGNOSIS — O10012 Pre-existing essential hypertension complicating pregnancy, second trimester: Secondary | ICD-10-CM | POA: Diagnosis not present

## 2022-11-29 DIAGNOSIS — O10912 Unspecified pre-existing hypertension complicating pregnancy, second trimester: Secondary | ICD-10-CM

## 2022-11-29 DIAGNOSIS — E669 Obesity, unspecified: Secondary | ICD-10-CM

## 2022-11-29 DIAGNOSIS — Z86711 Personal history of pulmonary embolism: Secondary | ICD-10-CM | POA: Diagnosis not present

## 2022-11-29 DIAGNOSIS — O99012 Anemia complicating pregnancy, second trimester: Secondary | ICD-10-CM

## 2022-11-29 DIAGNOSIS — I10 Essential (primary) hypertension: Secondary | ICD-10-CM | POA: Diagnosis present

## 2022-11-29 DIAGNOSIS — D649 Anemia, unspecified: Secondary | ICD-10-CM

## 2022-11-29 DIAGNOSIS — O09899 Supervision of other high risk pregnancies, unspecified trimester: Secondary | ICD-10-CM

## 2022-11-29 DIAGNOSIS — Z141 Cystic fibrosis carrier: Secondary | ICD-10-CM

## 2022-11-29 DIAGNOSIS — F17210 Nicotine dependence, cigarettes, uncomplicated: Secondary | ICD-10-CM

## 2022-11-29 DIAGNOSIS — Q0700 Arnold-Chiari syndrome without spina bifida or hydrocephalus: Secondary | ICD-10-CM

## 2022-11-29 DIAGNOSIS — O09212 Supervision of pregnancy with history of pre-term labor, second trimester: Secondary | ICD-10-CM | POA: Diagnosis not present

## 2022-11-29 DIAGNOSIS — O2622 Pregnancy care for patient with recurrent pregnancy loss, second trimester: Secondary | ICD-10-CM

## 2022-11-29 DIAGNOSIS — O09292 Supervision of pregnancy with other poor reproductive or obstetric history, second trimester: Secondary | ICD-10-CM

## 2022-11-29 DIAGNOSIS — Z8759 Personal history of other complications of pregnancy, childbirth and the puerperium: Secondary | ICD-10-CM | POA: Insufficient documentation

## 2022-11-29 DIAGNOSIS — Z9889 Other specified postprocedural states: Secondary | ICD-10-CM

## 2022-11-29 DIAGNOSIS — O10919 Unspecified pre-existing hypertension complicating pregnancy, unspecified trimester: Secondary | ICD-10-CM

## 2022-11-29 DIAGNOSIS — O99332 Smoking (tobacco) complicating pregnancy, second trimester: Secondary | ICD-10-CM

## 2022-11-29 DIAGNOSIS — O09893 Supervision of other high risk pregnancies, third trimester: Secondary | ICD-10-CM

## 2022-11-29 NOTE — Progress Notes (Unsigned)
Patient information  Patient Name: Kimberly Stark  Patient MRN:   [id number]  Referring practice: MFM Referring Provider: [facility])  MFM CONSULT  Kimberly Stark is a 32 y.o. [id number] at [redacted]w[redacted]d here for ultrasound and consultation.   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  She was never told that she was dilated but was instructed to have a cerclage which was performed.  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  In her subsequent pregnancy she had a prophylactic cerclage and delivered at term.  In the next pregnancy she did not have a cerclage but then went on to deliver at term as well.  She also has a history of a LEEP procedure.  Thus far her cervical lengths have been normal this pregnancy.  This pregnancy she currently declined cerclage but is now second-guessing herself regarding this decision.  She reports a minor fall last week and experienced some vaginal spotting.  She says this feels like it did when she had her cerclage the first time.  I discussed that that sounds more like an abruption versus cervical incompetence and a cerclage would not be the primary treatment for that but since she has had a previous cerclage she is technically a candidate if she would like.  I discussed that cerclage is not performed beyond 24 weeks and that if she would like a cerclage to be done she should notify her OB provider as soon as she makes that decision or continue cervical length assessment instead of cerclage.  I also discussed that there is no longer intramuscular progesterone for the use of preterm birth prevention but vaginal progesterone is an acceptable alternative although there is no strong data to suggest this is efficacious.  The patient said she would think about  her options and let us know if she changes her mind.  RE pulmonary embolism: Patient reports that she was diagnosed with a pulmonary embolism after severe COVID infection in 2021.  Based on her history of multiple miscarriages as well as a pulmonary embolism she had APS testing.  So far her labs have been negative but her mother also has antiphospholipid syndrome and given her clinical history a presumptive diagnosis was made and she has been taking Lovenox without difficulty.  After review of the EMR it appears she is never had a beta2 glycoprotein lab.  I saw her was ordered in the past but never completed.  I we will have her schedule a lab appointment of this done today.  I discussed that if she were to have a cerclage she would need to discontinue Lovenox at least 24 hours prior for neuraxial analgesia to be considered safe and this would slightly increase her risk of a thromboembolic event.  RE Arnold-Chiari malformation: Patient reports a history of a Chiari malformation.  She reports that she has had successful epidurals and spinal's in the past but typically has a brain MRI followed by a neurosurgery consultation to approve the use of neuraxial anesthesia.  I recommend she have an anesthesia consult the third trimester.  RE chronic hypertension: She has a history of chronic hypertension that is well-controlled with labetalol.  Her blood pressure is 125/68 today without symptoms.  Sonographic findings Single intrauterine pregnancy. Fetal cardiac activity:  Observed  and appears normal. Presentation: Transverse, head to maternal left. Interval fetal anatomy appears normal. Amniotic fluid volume: Within normal limits. MVP: 3.98 cm. Placenta: Anterior. Transvaginal ultrasound: 4 cm cervical length without evidence of funneling.   Assessment History of cervical cerclage  History of pulmonary embolus (PE)  Arnold-Chiari malformation (HCC)  Chronic hypertension affecting pregnancy  History  of preterm delivery, currently pregnant in third trimester Plan -Patient to decide if she would like a cerclage versus continued cervical lengths versus vaginal progesterone.  I have scheduled a transvaginal cerclage to be performed in approximately 10 days.  She will also let her OB provider know if she would like a cerclage or vaginal progesterone. -Beta-2 glycoprotein labs to be completed today to complete her antiphospholipid workup.  Regardless of the results that she should continue Lovenox due to her history of a pulmonary embolism through her pregnancy as well as postpartum at least 6 to 96 weeks.-Continue serial growth ultrasounds as well as antenatal testing to start around 32 weeks. -Recommend anesthesia consultation as well as neurosurgery consultation sometime in the third trimester due to her history of a Chiari malformation.  If she decides to have a cerclage she should have this consultation prior to her cerclage procedure. -Continue labetalol and blood pressure monitoring at home and at her prenatal visits. -Delivery timing will likely be around [redacted] weeks gestation.  Review of Systems: A review of systems was performed and was negative except per HPI   Vitals and Physical Exam    11/29/2022    1:20 PM 11/26/2022    1:43 PM 11/26/2022   10:51 AM  Vitals with BMI  Systolic 125 143 161  Diastolic 60 74 55  Pulse 79 78 86  Sitting comfortably on the sonogram table Nonlabored breathing Normal rate and rhythm Abdomen is nontender  Past pregnancies OB History  Gravida Para Term Preterm AB Living  12 3 2 1 8 3   SAB IAB Ectopic Multiple Live Births  7 1 0 0 3    # Outcome Date GA Lbr Len/2nd Weight Sex Type Anes PTL Lv  12 Current           11 Term 12/30/21 [redacted]w[redacted]d  2.9 kg F CS-LTranv Spinal  LIV  10 Term 08/13/19 [redacted]w[redacted]d  3.714 kg  CS-LTranv  Y LIV     Complications: Preeclampsia, Cervical cerclage suture present, unspecified trimester  9 Preterm 11/23/12 [redacted]w[redacted]d  2.5 kg   Vag-Spont   LIV     Complications: Preeclampsia, History of cervical cerclage  8 IAB           7 SAB           6 SAB           5 SAB           4 SAB           3 SAB           2 SAB           1 SAB             Obstetric Comments  Pre-E with both preg, induced with both    I spent 45 minutes reviewing the patients chart, including labs and images as well as counseling the patient about her medical conditions. Greater than 50% of the time was spent in direct face-to-face patient counseling.  Braxton Feathers  MFM, [facility]   11/29/2022  2:56 PM

## 2022-12-04 ENCOUNTER — Telehealth (HOSPITAL_COMMUNITY): Payer: Self-pay | Admitting: *Deleted

## 2022-12-04 ENCOUNTER — Encounter (HOSPITAL_COMMUNITY): Payer: Self-pay | Admitting: *Deleted

## 2022-12-04 NOTE — Telephone Encounter (Signed)
Preadmission screen NPO p MN arrive at 0745 take labetalol with a sip of water.  Office told her not to take her lovenox for 24 hours prior to cerclage so holding Tuesday dose

## 2022-12-05 ENCOUNTER — Encounter (HOSPITAL_COMMUNITY): Payer: Self-pay | Admitting: Anesthesiology

## 2022-12-06 ENCOUNTER — Observation Stay (HOSPITAL_COMMUNITY)
Admission: RE | Admit: 2022-12-06 | Discharge: 2022-12-06 | Disposition: A | Discharge status: Home / Self Care | Payer: No Typology Code available for payment source | Attending: Obstetrics and Gynecology | Admitting: Obstetrics and Gynecology

## 2022-12-06 ENCOUNTER — Encounter (HOSPITAL_COMMUNITY): Payer: Self-pay | Admitting: Obstetrics and Gynecology

## 2022-12-06 ENCOUNTER — Encounter (HOSPITAL_COMMUNITY)
Admission: RE | Disposition: A | Discharge status: Home / Self Care | Payer: Self-pay | Source: Home / Self Care | Attending: Obstetrics and Gynecology

## 2022-12-06 ENCOUNTER — Other Ambulatory Visit: Payer: Self-pay

## 2022-12-06 DIAGNOSIS — Z91040 Latex allergy status: Secondary | ICD-10-CM | POA: Insufficient documentation

## 2022-12-06 DIAGNOSIS — J45909 Unspecified asthma, uncomplicated: Secondary | ICD-10-CM | POA: Diagnosis not present

## 2022-12-06 DIAGNOSIS — Z8751 Personal history of pre-term labor: Secondary | ICD-10-CM | POA: Diagnosis present

## 2022-12-06 DIAGNOSIS — O99512 Diseases of the respiratory system complicating pregnancy, second trimester: Secondary | ICD-10-CM | POA: Insufficient documentation

## 2022-12-06 DIAGNOSIS — Z3A23 23 weeks gestation of pregnancy: Secondary | ICD-10-CM | POA: Diagnosis not present

## 2022-12-06 DIAGNOSIS — Z86711 Personal history of pulmonary embolism: Secondary | ICD-10-CM | POA: Insufficient documentation

## 2022-12-06 DIAGNOSIS — O99332 Smoking (tobacco) complicating pregnancy, second trimester: Secondary | ICD-10-CM | POA: Diagnosis not present

## 2022-12-06 DIAGNOSIS — F17210 Nicotine dependence, cigarettes, uncomplicated: Secondary | ICD-10-CM | POA: Diagnosis not present

## 2022-12-06 DIAGNOSIS — Z98891 History of uterine scar from previous surgery: Secondary | ICD-10-CM | POA: Diagnosis not present

## 2022-12-06 DIAGNOSIS — O10012 Pre-existing essential hypertension complicating pregnancy, second trimester: Secondary | ICD-10-CM | POA: Diagnosis not present

## 2022-12-06 DIAGNOSIS — Z8616 Personal history of COVID-19: Secondary | ICD-10-CM | POA: Diagnosis not present

## 2022-12-06 SURGERY — CERCLAGE, CERVIX, VAGINAL APPROACH
Anesthesia: Choice

## 2022-12-06 NOTE — Discharge Summary (Signed)
Nml ankle/foot  Other  Umbilical Cord:        Normal 3-vessel        Genitalia:              Female-nml ---------------------------------------------------------------------- Cervix Uterus Adnexa  Cervix  Length:            4.3  cm.  Normal appearance by transvaginal scan  Uterus  No abnormality visualized.  Right Ovary  Within normal limits.  Left Ovary  Within normal limits.  Cul De Sac  No free fluid seen.  Adnexa  No abnormality visualized ---------------------------------------------------------------------- Impression  Patient returned for completion of fetal anatomy scan and  cervical length measurement.  On cell-free fetal DNA  screening, the risks of fetal aneuploidies are not increased.  We performed fetal anatomy scan. No makers of  aneuploidies or fetal structural defects are seen. Fetal  biometry is consistent with her previously-established dates.  Amniotic fluid is normal and good fetal activity is seen.  Patient understands the limitations of ultrasound in detecting  fetal anomalies.  As maternal obesity imposes limitations on the resolution of  images, fetal anomalies may be missed.  We performed a transvaginal ultrasound to evaluate the  cervix.  The cervix measures 4.3 cm, which is normal.  No  shortening or funneling was seen on transfundal pressure.  We reassured the patient of the findings. ---------------------------------------------------------------------- Recommendations  - Cervical length measurement in 2 weeks.  -Completion of fetal anatomy in 4 weeks ----------------------------------------------------------------------                 Noralee Space, MD Electronically Signed Final Report   11/10/2022 03:16 pm ----------------------------------------------------------------------   [place] MFM OB DETAIL +14 WK  Result Date:  11/10/2022 ----------------------------------------------------------------------  OBSTETRICS REPORT                       (Signed Final 11/10/2022 03:16 pm) ---------------------------------------------------------------------- Patient Info  ID #:       [id number]                          D.O.B.:  10/26/1990 (32 yrs)  Name:       Kimberly Stark                   Visit Date: 11/10/2022 01:39 pm ---------------------------------------------------------------------- Performed By  Attending:        Noralee Space MD        Ref. Address:     [address]                                                             [place], [place]                                                             [address]  Performed By:     Alain Marion     Location:         [facility])                              5.9 ---------------------------------------------------------------------- OB  History  Blood Type:   O+  Gravidity:    12        Term:   2        Prem:   1        SAB:   7  TOP:          1         Living: 3 ---------------------------------------------------------------------- Gestational Age  Best:          21w 4d     Det. By:  U/S C R L  (09/21/22)    EDD:   04/04/23 ---------------------------------------------------------------------- Cervix Uterus Adnexa  Cervix  Length:            4.4  cm. ---------------------------------------------------------------------- Comments  This patient presented to the MAU following a fall.  A limited ultrasound performed today shows that the fetus is  in the vertex presentation.  There was normal amniotic fluid noted.  A normal-appearing anterior placenta is noted. ----------------------------------------------------------------------                   Ma Rings, MD Electronically Signed Final Report   11/26/2022 09:28 pm ----------------------------------------------------------------------   [place] MFM OB Transvaginal  Result Date: 11/10/2022 ----------------------------------------------------------------------  OBSTETRICS REPORT                       (Signed Final 11/10/2022 03:16 pm) ---------------------------------------------------------------------- Patient Info  ID #:       [id number]                          D.O.B.:  03/04/1991 (32 yrs)  Name:       Kimberly Stark                   Visit Date: 11/10/2022 01:39 pm ---------------------------------------------------------------------- Performed By  Attending:        Noralee Space MD        Ref. Address:     [address]                                                             [place], [place]                                                             [address]  Performed By:     Alain Marion     Location:         [facility]                    [facility]                                     [facility] at  [facility](cm)                              5.9 ---------------------------------------------------------------------- OB  History  Blood Type:   O+  Gravidity:    12        Term:   2        Prem:   1        SAB:   7  TOP:          1         Living: 3 ---------------------------------------------------------------------- Gestational Age  Best:          21w 4d     Det. By:  U/S C R L  (09/21/22)    EDD:   04/04/23 ---------------------------------------------------------------------- Cervix Uterus Adnexa  Cervix  Length:            4.4  cm. ---------------------------------------------------------------------- Comments  This patient presented to the MAU following a fall.  A limited ultrasound performed today shows that the fetus is  in the vertex presentation.  There was normal amniotic fluid noted.  A normal-appearing anterior placenta is noted. ----------------------------------------------------------------------                   Ma Rings, MD Electronically Signed Final Report   11/26/2022 09:28 pm ----------------------------------------------------------------------   [place] MFM OB Transvaginal  Result Date: 11/10/2022 ----------------------------------------------------------------------  OBSTETRICS REPORT                       (Signed Final 11/10/2022 03:16 pm) ---------------------------------------------------------------------- Patient Info  ID #:       [id number]                          D.O.B.:  03/04/1991 (32 yrs)  Name:       Kimberly Stark                   Visit Date: 11/10/2022 01:39 pm ---------------------------------------------------------------------- Performed By  Attending:        Noralee Space MD        Ref. Address:     [address]                                                             [place], [place]                                                             [address]  Performed By:     Alain Marion     Location:         [facility]                    [facility]                                     [facility] at  [facility](cm)                              5.9 ---------------------------------------------------------------------- OB  History  Blood Type:   O+  Gravidity:    12        Term:   2        Prem:   1        SAB:   7  TOP:          1         Living: 3 ---------------------------------------------------------------------- Gestational Age  Best:          21w 4d     Det. By:  U/S C R L  (09/21/22)    EDD:   04/04/23 ---------------------------------------------------------------------- Cervix Uterus Adnexa  Cervix  Length:            4.4  cm. ---------------------------------------------------------------------- Comments  This patient presented to the MAU following a fall.  A limited ultrasound performed today shows that the fetus is  in the vertex presentation.  There was normal amniotic fluid noted.  A normal-appearing anterior placenta is noted. ----------------------------------------------------------------------                   Ma Rings, MD Electronically Signed Final Report   11/26/2022 09:28 pm ----------------------------------------------------------------------   [place] MFM OB Transvaginal  Result Date: 11/10/2022 ----------------------------------------------------------------------  OBSTETRICS REPORT                       (Signed Final 11/10/2022 03:16 pm) ---------------------------------------------------------------------- Patient Info  ID #:       [id number]                          D.O.B.:  03/04/1991 (32 yrs)  Name:       Kimberly Stark                   Visit Date: 11/10/2022 01:39 pm ---------------------------------------------------------------------- Performed By  Attending:        Noralee Space MD        Ref. Address:     [address]                                                             [place], [place]                                                             [address]  Performed By:     Alain Marion     Location:         [facility]                    [facility]                                     Fetal Care at  Antenatal Physician Discharge Summary  Patient ID: Keuna Janney MRN: [id number] DOB/AGE: 26-Apr-1990 32 y.o.  Admit date: 12/06/2022 Discharge date: 12/06/2022  Admission Diagnoses: History of preterm delivery [Z87.51]  Discharge Diagnoses:  History of preterm delivery  Prenatal Procedures: none  Consults: none  Hospital Course:  Tkeyah Razzaq is a 32 y.o. [id number] with IUP at [redacted]w[redacted]d who initially presented for history indicated cerclage. After discussion of risks/benefits, patient opted NOT to get cerclage. For details of her counseling, please see HPI. She was discharged home in good condition and will follow up as scheduled on 9/27 and 10/1.   Significant Diagnostic Studies:  No results found for this or any previous visit (from the past 168 hour(s)). [place] MFM OB Transvaginal  Result Date: 11/29/2022 ----------------------------------------------------------------------  OBSTETRICS REPORT                       (Signed Final 11/29/2022 03:24 pm) ---------------------------------------------------------------------- Patient Info  ID #:       [id number]                          D.O.B.:  December 30, 1990 (32 yrs)  Name:       Kimberly Stark                   Visit Date: 11/29/2022 01:19 pm ---------------------------------------------------------------------- Performed By  Attending:        Braxton Feathers DO       Ref. Address:     [address]                                                             [place], [place]                                                             [address]  Performed By:     Alain Marion     Location:         [facility]                    [facility]                                     [facility] at                                                             [facility]                                                             [facility]  Referred By:  [facility](cm)                              5.9 ---------------------------------------------------------------------- OB  History  Blood Type:   O+  Gravidity:    12        Term:   2        Prem:   1        SAB:   7  TOP:          1         Living: 3 ---------------------------------------------------------------------- Gestational Age  Best:          21w 4d     Det. By:  U/S C R L  (09/21/22)    EDD:   04/04/23 ---------------------------------------------------------------------- Cervix Uterus Adnexa  Cervix  Length:            4.4  cm. ---------------------------------------------------------------------- Comments  This patient presented to the MAU following a fall.  A limited ultrasound performed today shows that the fetus is  in the vertex presentation.  There was normal amniotic fluid noted.  A normal-appearing anterior placenta is noted. ----------------------------------------------------------------------                   Ma Rings, MD Electronically Signed Final Report   11/26/2022 09:28 pm ----------------------------------------------------------------------   [place] MFM OB Transvaginal  Result Date: 11/10/2022 ----------------------------------------------------------------------  OBSTETRICS REPORT                       (Signed Final 11/10/2022 03:16 pm) ---------------------------------------------------------------------- Patient Info  ID #:       [id number]                          D.O.B.:  03/04/1991 (32 yrs)  Name:       Kimberly Stark                   Visit Date: 11/10/2022 01:39 pm ---------------------------------------------------------------------- Performed By  Attending:        Noralee Space MD        Ref. Address:     [address]                                                             [place], [place]                                                             [address]  Performed By:     Alain Marion     Location:         [facility]                    [facility]                                     Fetal Care at  Nml ankle/foot  Other  Umbilical Cord:        Normal 3-vessel        Genitalia:              Female-nml ---------------------------------------------------------------------- Cervix Uterus Adnexa  Cervix  Length:            4.3  cm.  Normal appearance by transvaginal scan  Uterus  No abnormality visualized.  Right Ovary  Within normal limits.  Left Ovary  Within normal limits.  Cul De Sac  No free fluid seen.  Adnexa  No abnormality visualized ---------------------------------------------------------------------- Impression  Patient returned for completion of fetal anatomy scan and  cervical length measurement.  On cell-free fetal DNA  screening, the risks of fetal aneuploidies are not increased.  We performed fetal anatomy scan. No makers of  aneuploidies or fetal structural defects are seen. Fetal  biometry is consistent with her previously-established dates.  Amniotic fluid is normal and good fetal activity is seen.  Patient understands the limitations of ultrasound in detecting  fetal anomalies.  As maternal obesity imposes limitations on the resolution of  images, fetal anomalies may be missed.  We performed a transvaginal ultrasound to evaluate the  cervix.  The cervix measures 4.3 cm, which is normal.  No  shortening or funneling was seen on transfundal pressure.  We reassured the patient of the findings. ---------------------------------------------------------------------- Recommendations  - Cervical length measurement in 2 weeks.  -Completion of fetal anatomy in 4 weeks ----------------------------------------------------------------------                 Noralee Space, MD Electronically Signed Final Report   11/10/2022 03:16 pm ----------------------------------------------------------------------   [place] MFM OB DETAIL +14 WK  Result Date:  11/10/2022 ----------------------------------------------------------------------  OBSTETRICS REPORT                       (Signed Final 11/10/2022 03:16 pm) ---------------------------------------------------------------------- Patient Info  ID #:       [id number]                          D.O.B.:  10/26/1990 (32 yrs)  Name:       Kimberly Stark                   Visit Date: 11/10/2022 01:39 pm ---------------------------------------------------------------------- Performed By  Attending:        Noralee Space MD        Ref. Address:     [address]                                                             [place], [place]                                                             [address]  Performed By:     Alain Marion     Location:         [facility])                              5.9 ---------------------------------------------------------------------- OB  History  Blood Type:   O+  Gravidity:    12        Term:   2        Prem:   1        SAB:   7  TOP:          1         Living: 3 ---------------------------------------------------------------------- Gestational Age  Best:          21w 4d     Det. By:  U/S C R L  (09/21/22)    EDD:   04/04/23 ---------------------------------------------------------------------- Cervix Uterus Adnexa  Cervix  Length:            4.4  cm. ---------------------------------------------------------------------- Comments  This patient presented to the MAU following a fall.  A limited ultrasound performed today shows that the fetus is  in the vertex presentation.  There was normal amniotic fluid noted.  A normal-appearing anterior placenta is noted. ----------------------------------------------------------------------                   Ma Rings, MD Electronically Signed Final Report   11/26/2022 09:28 pm ----------------------------------------------------------------------   [place] MFM OB Transvaginal  Result Date: 11/10/2022 ----------------------------------------------------------------------  OBSTETRICS REPORT                       (Signed Final 11/10/2022 03:16 pm) ---------------------------------------------------------------------- Patient Info  ID #:       [id number]                          D.O.B.:  03/04/1991 (32 yrs)  Name:       Kimberly Stark                   Visit Date: 11/10/2022 01:39 pm ---------------------------------------------------------------------- Performed By  Attending:        Noralee Space MD        Ref. Address:     [address]                                                             [place], [place]                                                             [address]  Performed By:     Alain Marion     Location:         [facility]                                     Fetal Care at  Antenatal Physician Discharge Summary  Patient ID: Keuna Janney MRN: [id number] DOB/AGE: 26-Apr-1990 32 y.o.  Admit date: 12/06/2022 Discharge date: 12/06/2022  Admission Diagnoses: History of preterm delivery [Z87.51]  Discharge Diagnoses:  History of preterm delivery  Prenatal Procedures: none  Consults: none  Hospital Course:  Tkeyah Razzaq is a 32 y.o. [id number] with IUP at [redacted]w[redacted]d who initially presented for history indicated cerclage. After discussion of risks/benefits, patient opted NOT to get cerclage. For details of her counseling, please see HPI. She was discharged home in good condition and will follow up as scheduled on 9/27 and 10/1.   Significant Diagnostic Studies:  No results found for this or any previous visit (from the past 168 hour(s)). [place] MFM OB Transvaginal  Result Date: 11/29/2022 ----------------------------------------------------------------------  OBSTETRICS REPORT                       (Signed Final 11/29/2022 03:24 pm) ---------------------------------------------------------------------- Patient Info  ID #:       [id number]                          D.O.B.:  December 30, 1990 (32 yrs)  Name:       Kimberly Stark                   Visit Date: 11/29/2022 01:19 pm ---------------------------------------------------------------------- Performed By  Attending:        Braxton Feathers DO       Ref. Address:     [address]                                                             [place], [place]                                                             [address]  Performed By:     Alain Marion     Location:         [facility]                    [facility]                                     [facility] at                                                             [facility]                                                             [facility]  Referred By:  Nml ankle/foot  Other  Umbilical Cord:        Normal 3-vessel        Genitalia:              Female-nml ---------------------------------------------------------------------- Cervix Uterus Adnexa  Cervix  Length:            4.3  cm.  Normal appearance by transvaginal scan  Uterus  No abnormality visualized.  Right Ovary  Within normal limits.  Left Ovary  Within normal limits.  Cul De Sac  No free fluid seen.  Adnexa  No abnormality visualized ---------------------------------------------------------------------- Impression  Patient returned for completion of fetal anatomy scan and  cervical length measurement.  On cell-free fetal DNA  screening, the risks of fetal aneuploidies are not increased.  We performed fetal anatomy scan. No makers of  aneuploidies or fetal structural defects are seen. Fetal  biometry is consistent with her previously-established dates.  Amniotic fluid is normal and good fetal activity is seen.  Patient understands the limitations of ultrasound in detecting  fetal anomalies.  As maternal obesity imposes limitations on the resolution of  images, fetal anomalies may be missed.  We performed a transvaginal ultrasound to evaluate the  cervix.  The cervix measures 4.3 cm, which is normal.  No  shortening or funneling was seen on transfundal pressure.  We reassured the patient of the findings. ---------------------------------------------------------------------- Recommendations  - Cervical length measurement in 2 weeks.  -Completion of fetal anatomy in 4 weeks ----------------------------------------------------------------------                 Noralee Space, MD Electronically Signed Final Report   11/10/2022 03:16 pm ----------------------------------------------------------------------   [place] MFM OB DETAIL +14 WK  Result Date:  11/10/2022 ----------------------------------------------------------------------  OBSTETRICS REPORT                       (Signed Final 11/10/2022 03:16 pm) ---------------------------------------------------------------------- Patient Info  ID #:       [id number]                          D.O.B.:  10/26/1990 (32 yrs)  Name:       Kimberly Stark                   Visit Date: 11/10/2022 01:39 pm ---------------------------------------------------------------------- Performed By  Attending:        Noralee Space MD        Ref. Address:     [address]                                                             [place], [place]                                                             [address]  Performed By:     Alain Marion     Location:         [facility]  Nml ankle/foot  Other  Umbilical Cord:        Normal 3-vessel        Genitalia:              Female-nml ---------------------------------------------------------------------- Cervix Uterus Adnexa  Cervix  Length:            4.3  cm.  Normal appearance by transvaginal scan  Uterus  No abnormality visualized.  Right Ovary  Within normal limits.  Left Ovary  Within normal limits.  Cul De Sac  No free fluid seen.  Adnexa  No abnormality visualized ---------------------------------------------------------------------- Impression  Patient returned for completion of fetal anatomy scan and  cervical length measurement.  On cell-free fetal DNA  screening, the risks of fetal aneuploidies are not increased.  We performed fetal anatomy scan. No makers of  aneuploidies or fetal structural defects are seen. Fetal  biometry is consistent with her previously-established dates.  Amniotic fluid is normal and good fetal activity is seen.  Patient understands the limitations of ultrasound in detecting  fetal anomalies.  As maternal obesity imposes limitations on the resolution of  images, fetal anomalies may be missed.  We performed a transvaginal ultrasound to evaluate the  cervix.  The cervix measures 4.3 cm, which is normal.  No  shortening or funneling was seen on transfundal pressure.  We reassured the patient of the findings. ---------------------------------------------------------------------- Recommendations  - Cervical length measurement in 2 weeks.  -Completion of fetal anatomy in 4 weeks ----------------------------------------------------------------------                 Noralee Space, MD Electronically Signed Final Report   11/10/2022 03:16 pm ----------------------------------------------------------------------   [place] MFM OB DETAIL +14 WK  Result Date:  11/10/2022 ----------------------------------------------------------------------  OBSTETRICS REPORT                       (Signed Final 11/10/2022 03:16 pm) ---------------------------------------------------------------------- Patient Info  ID #:       [id number]                          D.O.B.:  10/26/1990 (32 yrs)  Name:       Kimberly Stark                   Visit Date: 11/10/2022 01:39 pm ---------------------------------------------------------------------- Performed By  Attending:        Noralee Space MD        Ref. Address:     [address]                                                             [place], [place]                                                             [address]  Performed By:     Alain Marion     Location:         [facility]  [facility](cm)                              5.9 ---------------------------------------------------------------------- OB  History  Blood Type:   O+  Gravidity:    12        Term:   2        Prem:   1        SAB:   7  TOP:          1         Living: 3 ---------------------------------------------------------------------- Gestational Age  Best:          21w 4d     Det. By:  U/S C R L  (09/21/22)    EDD:   04/04/23 ---------------------------------------------------------------------- Cervix Uterus Adnexa  Cervix  Length:            4.4  cm. ---------------------------------------------------------------------- Comments  This patient presented to the MAU following a fall.  A limited ultrasound performed today shows that the fetus is  in the vertex presentation.  There was normal amniotic fluid noted.  A normal-appearing anterior placenta is noted. ----------------------------------------------------------------------                   Ma Rings, MD Electronically Signed Final Report   11/26/2022 09:28 pm ----------------------------------------------------------------------   [place] MFM OB Transvaginal  Result Date: 11/10/2022 ----------------------------------------------------------------------  OBSTETRICS REPORT                       (Signed Final 11/10/2022 03:16 pm) ---------------------------------------------------------------------- Patient Info  ID #:       [id number]                          D.O.B.:  03/04/1991 (32 yrs)  Name:       Kimberly Stark                   Visit Date: 11/10/2022 01:39 pm ---------------------------------------------------------------------- Performed By  Attending:        Noralee Space MD        Ref. Address:     [address]                                                             [place], [place]                                                             [address]  Performed By:     Alain Marion     Location[facility]         [facility]                                     [facility] at  Nml ankle/foot  Other  Umbilical Cord:        Normal 3-vessel        Genitalia:              Female-nml ---------------------------------------------------------------------- Cervix Uterus Adnexa  Cervix  Length:            4.3  cm.  Normal appearance by transvaginal scan  Uterus  No abnormality visualized.  Right Ovary  Within normal limits.  Left Ovary  Within normal limits.  Cul De Sac  No free fluid seen.  Adnexa  No abnormality visualized ---------------------------------------------------------------------- Impression  Patient returned for completion of fetal anatomy scan and  cervical length measurement.  On cell-free fetal DNA  screening, the risks of fetal aneuploidies are not increased.  We performed fetal anatomy scan. No makers of  aneuploidies or fetal structural defects are seen. Fetal  biometry is consistent with her previously-established dates.  Amniotic fluid is normal and good fetal activity is seen.  Patient understands the limitations of ultrasound in detecting  fetal anomalies.  As maternal obesity imposes limitations on the resolution of  images, fetal anomalies may be missed.  We performed a transvaginal ultrasound to evaluate the  cervix.  The cervix measures 4.3 cm, which is normal.  No  shortening or funneling was seen on transfundal pressure.  We reassured the patient of the findings. ---------------------------------------------------------------------- Recommendations  - Cervical length measurement in 2 weeks.  -Completion of fetal anatomy in 4 weeks ----------------------------------------------------------------------                 Noralee Space, MD Electronically Signed Final Report   11/10/2022 03:16 pm ----------------------------------------------------------------------   [place] MFM OB DETAIL +14 WK  Result Date:  11/10/2022 ----------------------------------------------------------------------  OBSTETRICS REPORT                       (Signed Final 11/10/2022 03:16 pm) ---------------------------------------------------------------------- Patient Info  ID #:       [id number]                          D.O.B.:  10/26/1990 (32 yrs)  Name:       Kimberly Stark                   Visit Date: 11/10/2022 01:39 pm ---------------------------------------------------------------------- Performed By  Attending:        Noralee Space MD        Ref. Address:     [address]                                                             [place], [place]                                                             [address]  Performed By:     Alain Marion     Location:         [facility]  [facility] [place](cm)                              5.9 ---------------------------------------------------------------------- OB  History  Blood Type:   O+  Gravidity:    12        Term:   2        Prem:   1        SAB:   7  TOP:          1         Living: 3 ---------------------------------------------------------------------- Gestational Age  Best:          21w 4d     Det. By:  U/S C R L  (09/21/22)    EDD:   04/04/23 ---------------------------------------------------------------------- Cervix Uterus Adnexa  Cervix  Length:            4.4  cm. ---------------------------------------------------------------------- Comments  This patient presented to the MAU following a fall.  A limited ultrasound performed today shows that the fetus is  in the vertex presentation.  There was normal amniotic fluid noted.  A normal-appearing anterior placenta is noted. ----------------------------------------------------------------------                   Ma Rings, MD Electronically Signed Final Report   11/26/2022 09:28 pm ----------------------------------------------------------------------   [place] MFM OB Transvaginal  Result Date: 11/10/2022 ----------------------------------------------------------------------  OBSTETRICS REPORT                       (Signed Final 11/10/2022 03:16 pm) ---------------------------------------------------------------------- Patient Info  ID #:       [id number]                          D.O.B.:  03/04/1991 (32 yrs)  Name:       Kimberly Stark                   Visit Date: 11/10/2022 01:39 pm ---------------------------------------------------------------------- Performed By  Attending:        Noralee Space MD        Ref. Address:     [address]                                                             [place], [place]                                                             [address]  Performed By:     Alain Marion     Location:         [facility]                                     [facility] at  [facility] [place](cm)                              5.9 ---------------------------------------------------------------------- OB  History  Blood Type:   O+  Gravidity:    12        Term:   2        Prem:   1        SAB:   7  TOP:          1         Living: 3 ---------------------------------------------------------------------- Gestational Age  Best:          21w 4d     Det. By:  U/S C R L  (09/21/22)    EDD:   04/04/23 ---------------------------------------------------------------------- Cervix Uterus Adnexa  Cervix  Length:            4.4  cm. ---------------------------------------------------------------------- Comments  This patient presented to the MAU following a fall.  A limited ultrasound performed today shows that the fetus is  in the vertex presentation.  There was normal amniotic fluid noted.  A normal-appearing anterior placenta is noted. ----------------------------------------------------------------------                   Ma Rings, MD Electronically Signed Final Report   11/26/2022 09:28 pm ----------------------------------------------------------------------   [place] MFM OB Transvaginal  Result Date: 11/10/2022 ----------------------------------------------------------------------  OBSTETRICS REPORT                       (Signed Final 11/10/2022 03:16 pm) ---------------------------------------------------------------------- Patient Info  ID #:       [id number]                          D.O.B.:  03/04/1991 (32 yrs)  Name:       Kimberly Stark                   Visit Date: 11/10/2022 01:39 pm ---------------------------------------------------------------------- Performed By  Attending:        Noralee Space MD        Ref. Address:     [address]                                                             [place], [place]                                                             [address]  Performed By:     Alain Marion     Location:         [facility]                                     [facility]etal Care at  Antenatal Physician Discharge Summary  Patient ID: Keuna Janney MRN: [id number] DOB/AGE: 26-Apr-1990 32 y.o.  Admit date: 12/06/2022 Discharge date: 12/06/2022  Admission Diagnoses: History of preterm delivery [Z87.51]  Discharge Diagnoses:  History of preterm delivery  Prenatal Procedures: none  Consults: none  Hospital Course:  Tkeyah Razzaq is a 32 y.o. [id number] with IUP at [redacted]w[redacted]d who initially presented for history indicated cerclage. After discussion of risks/benefits, patient opted NOT to get cerclage. For details of her counseling, please see HPI. She was discharged home in good condition and will follow up as scheduled on 9/27 and 10/1.   Significant Diagnostic Studies:  No results found for this or any previous visit (from the past 168 hour(s)). [place] MFM OB Transvaginal  Result Date: 11/29/2022 ----------------------------------------------------------------------  OBSTETRICS REPORT                       (Signed Final 11/29/2022 03:24 pm) ---------------------------------------------------------------------- Patient Info  ID #:       [id number]                          D.O.B.:  December 30, 1990 (32 yrs)  Name:       Kimberly Stark                   Visit Date: 11/29/2022 01:19 pm ---------------------------------------------------------------------- Performed By  Attending:        Braxton Feathers DO       Ref. Address:     [address]                                                             [place], [place]                                                             [address]  Performed By:     Alain Marion     Location:         [facility]                    [facility]                                     [facility] at                                                             [facility]  Referred By:  Antenatal Physician Discharge Summary  Patient ID: Keuna Janney MRN: [id number] DOB/AGE: 26-Apr-1990 32 y.o.  Admit date: 12/06/2022 Discharge date: 12/06/2022  Admission Diagnoses: History of preterm delivery [Z87.51]  Discharge Diagnoses:  History of preterm delivery  Prenatal Procedures: none  Consults: none  Hospital Course:  Tkeyah Razzaq is a 32 y.o. [id number] with IUP at [redacted]w[redacted]d who initially presented for history indicated cerclage. After discussion of risks/benefits, patient opted NOT to get cerclage. For details of her counseling, please see HPI. She was discharged home in good condition and will follow up as scheduled on 9/27 and 10/1.   Significant Diagnostic Studies:  No results found for this or any previous visit (from the past 168 hour(s)). [place] MFM OB Transvaginal  Result Date: 11/29/2022 ----------------------------------------------------------------------  OBSTETRICS REPORT                       (Signed Final 11/29/2022 03:24 pm) ---------------------------------------------------------------------- Patient Info  ID #:       [id number]                          D.O.B.:  December 30, 1990 (32 yrs)  Name:       Kimberly Stark                   Visit Date: 11/29/2022 01:19 pm ---------------------------------------------------------------------- Performed By  Attending:        Braxton Feathers DO       Ref. Address:     [address]                                                             [place], [place]                                                             [address]  Performed By:     Alain Marion     Location:         [facility]                                     [facility] at                                                             [facility]  Referred By:  [facility](cm)                              5.9 ---------------------------------------------------------------------- OB  History  Blood Type:   O+  Gravidity:    12        Term:   2        Prem:   1        SAB:   7  TOP:          1         Living: 3 ---------------------------------------------------------------------- Gestational Age  Best:          21w 4d     Det. By:  U/S C R L  (09/21/22)    EDD:   04/04/23 ---------------------------------------------------------------------- Cervix Uterus Adnexa  Cervix  Length:            4.4  cm. ---------------------------------------------------------------------- Comments  This patient presented to the MAU following a fall.  A limited ultrasound performed today shows that the fetus is  in the vertex presentation.  There was normal amniotic fluid noted.  A normal-appearing anterior placenta is noted. ----------------------------------------------------------------------                   Ma Rings, MD Electronically Signed Final Report   11/26/2022 09:28 pm ----------------------------------------------------------------------   [place] MFM OB Transvaginal  Result Date: 11/10/2022 ----------------------------------------------------------------------  OBSTETRICS REPORT                       (Signed Final 11/10/2022 03:16 pm) ---------------------------------------------------------------------- Patient Info  ID #:       [id number]                          D.O.B.:  03/04/1991 (32 yrs)  Name:       Kimberly Stark                   Visit Date: 11/10/2022 01:39 pm ---------------------------------------------------------------------- Performed By  Attending:        Noralee Space MD        Ref. Address:     [address]                                                             [place], [place]                                                             [address]  Performed By:     Alain Marion     Location:         [facility]                                     Fetal Care at  Nml ankle/foot  Other  Umbilical Cord:        Normal 3-vessel        Genitalia:              Female-nml ---------------------------------------------------------------------- Cervix Uterus Adnexa  Cervix  Length:            4.3  cm.  Normal appearance by transvaginal scan  Uterus  No abnormality visualized.  Right Ovary  Within normal limits.  Left Ovary  Within normal limits.  Cul De Sac  No free fluid seen.  Adnexa  No abnormality visualized ---------------------------------------------------------------------- Impression  Patient returned for completion of fetal anatomy scan and  cervical length measurement.  On cell-free fetal DNA  screening, the risks of fetal aneuploidies are not increased.  We performed fetal anatomy scan. No makers of  aneuploidies or fetal structural defects are seen. Fetal  biometry is consistent with her previously-established dates.  Amniotic fluid is normal and good fetal activity is seen.  Patient understands the limitations of ultrasound in detecting  fetal anomalies.  As maternal obesity imposes limitations on the resolution of  images, fetal anomalies may be missed.  We performed a transvaginal ultrasound to evaluate the  cervix.  The cervix measures 4.3 cm, which is normal.  No  shortening or funneling was seen on transfundal pressure.  We reassured the patient of the findings. ---------------------------------------------------------------------- Recommendations  - Cervical length measurement in 2 weeks.  -Completion of fetal anatomy in 4 weeks ----------------------------------------------------------------------                 Noralee Space, MD Electronically Signed Final Report   11/10/2022 03:16 pm ----------------------------------------------------------------------   [place] MFM OB DETAIL +14 WK  Result Date:  11/10/2022 ----------------------------------------------------------------------  OBSTETRICS REPORT                       (Signed Final 11/10/2022 03:16 pm) ---------------------------------------------------------------------- Patient Info  ID #:       [id number]                          D.O.B.:  10/26/1990 (32 yrs)  Name:       Kimberly Stark                   Visit Date: 11/10/2022 01:39 pm ---------------------------------------------------------------------- Performed By  Attending:        Noralee Space MD        Ref. Address:     [address]                                                             [place], [place]                                                             [address]  Performed By:     Alain Marion     [facility]  Nml ankle/foot  Other  Umbilical Cord:        Normal 3-vessel        Genitalia:              Female-nml ---------------------------------------------------------------------- Cervix Uterus Adnexa  Cervix  Length:            4.3  cm.  Normal appearance by transvaginal scan  Uterus  No abnormality visualized.  Right Ovary  Within normal limits.  Left Ovary  Within normal limits.  Cul De Sac  No free fluid seen.  Adnexa  No abnormality visualized ---------------------------------------------------------------------- Impression  Patient returned for completion of fetal anatomy scan and  cervical length measurement.  On cell-free fetal DNA  screening, the risks of fetal aneuploidies are not increased.  We performed fetal anatomy scan. No makers of  aneuploidies or fetal structural defects are seen. Fetal  biometry is consistent with her previously-established dates.  Amniotic fluid is normal and good fetal activity is seen.  Patient understands the limitations of ultrasound in detecting  fetal anomalies.  As maternal obesity imposes limitations on the resolution of  images, fetal anomalies may be missed.  We performed a transvaginal ultrasound to evaluate the  cervix.  The cervix measures 4.3 cm, which is normal.  No  shortening or funneling was seen on transfundal pressure.  We reassured the patient of the findings. ---------------------------------------------------------------------- Recommendations  - Cervical length measurement in 2 weeks.  -Completion of fetal anatomy in 4 weeks ----------------------------------------------------------------------                 Noralee Space, MD Electronically Signed Final Report   11/10/2022 03:16 pm ----------------------------------------------------------------------   [place] MFM OB DETAIL +14 WK  Result Date:  11/10/2022 ----------------------------------------------------------------------  OBSTETRICS REPORT                       (Signed Final 11/10/2022 03:16 pm) ---------------------------------------------------------------------- Patient Info  ID #:       [id number]                          D.O.B.:  10/26/1990 (32 yrs)  Name:       Kimberly Stark                   Visit Date: 11/10/2022 01:39 pm ---------------------------------------------------------------------- Performed By  Attending:        Noralee Space MD        Ref. Address:     [address]                                                             [place], [place]                                                             [address]  Performed By:     Alain Marion     [facility]         [facility]  Antenatal Physician Discharge Summary  Patient ID: Keuna Janney MRN: [id number] DOB/AGE: 26-Apr-1990 32 y.o.  Admit date: 12/06/2022 Discharge date: 12/06/2022  Admission Diagnoses: History of preterm delivery [Z87.51]  Discharge Diagnoses:  History of preterm delivery  Prenatal Procedures: none  Consults: none  Hospital Course:  Tkeyah Razzaq is a 32 y.o. [id number] with IUP at [redacted]w[redacted]d who initially presented for history indicated cerclage. After discussion of risks/benefits, patient opted NOT to get cerclage. For details of her counseling, please see HPI. She was discharged home in good condition and will follow up as scheduled on 9/27 and 10/1.   Significant Diagnostic Studies:  No results found for this or any previous visit (from the past 168 hour(s)). [place] MFM OB Transvaginal  Result Date: 11/29/2022 ----------------------------------------------------------------------  OBSTETRICS REPORT                       (Signed Final 11/29/2022 03:24 pm) ---------------------------------------------------------------------- Patient Info  ID #:       [id number]                          D.O.B.:  December 30, 1990 (32 yrs)  Name:       Kimberly Stark                   Visit Date: 11/29/2022 01:19 pm ---------------------------------------------------------------------- Performed By  Attending:        Braxton Feathers DO       Ref. Address:     [address]                                                             [place], [place]                                                             [address]  Performed By:     Alain Marion     Location:         [facility]                                     [facility] at                                                             [facility]  Referred By:

## 2022-12-06 NOTE — OR Nursing (Signed)
Dr. Berton Lan here talking with patient and husband about need for cercalge at this gestastional age. Much discussion has been had about need of procedure.  Pt decided to not have cerclage done today

## 2022-12-06 NOTE — Anesthesia Preprocedure Evaluation (Signed)
Anesthesia Evaluation  Patient identified by MRN, date of birth, ID band Patient awake    Reviewed: Allergy & Precautions, NPO status , Patient's Chart, lab work & pertinent test results  Airway Mallampati: II  TM Distance: >3 FB Neck ROM: Full    Dental   Pulmonary asthma , Current Smoker   breath sounds clear to auscultation       Cardiovascular hypertension, Pt. on home beta blockers + DVT   Rhythm:Regular Rate:Normal     Neuro/Psych  Headaches    GI/Hepatic negative GI ROS, Neg liver ROS,,,  Endo/Other  negative endocrine ROS    Renal/GU negative Renal ROS     Musculoskeletal   Abdominal   Peds  Hematology  (+) Blood dyscrasia, Sickle cell trait and anemia   Anesthesia Other Findings   Reproductive/Obstetrics (+) Pregnancy                             Anesthesia Physical Anesthesia Plan  ASA: 3  Anesthesia Plan: Spinal   Post-op Pain Management:    Induction:   PONV Risk Score and Plan: 1 and Dexamethasone and Ondansetron  Airway Management Planned: Natural Airway  Additional Equipment:   Intra-op Plan:   Post-operative Plan:   Informed Consent: I have reviewed the patients History and Physical, chart, labs and discussed the procedure including the risks, benefits and alternatives for the proposed anesthesia with the patient or authorized representative who has indicated his/her understanding and acceptance.       Plan Discussed with: CRNA  Anesthesia Plan Comments:        Anesthesia Quick Evaluation

## 2022-12-06 NOTE — H&P (Signed)
OBSTETRIC ADMISSION HISTORY AND PHYSICAL  Kimberly Stark is a 32 y.o. female [phone] with IUP at [redacted]w[redacted]d by 12wk [place] presenting for history indicated cerclage.   Patient's OB history is as follows: G1 - exam indicated cerclage around 20-23 weeks, developed HELLP syndrome and was delivered at 30 weeks G2 (2021) - history indicated cerclage, delivered via CS at 37 weeks after IOL for preeclampsia G3 - (2023) - declined cerclage, delivered at 37 weeks via CS for cHTN  Patient had initially declined history indicated cerclage. She has been getting serial cervical lengths that have all been normal (most recently 4cm on 9/18). She reports a fall around 21 weeks with no direct abdominal trauma, but had some light bleeding that worried her. After that fall, she started to second guess her decision to decline the cerclage since her initial presentation in her G1 pregnancy was similar (vaginal bleeding, but no trauma). She discussed her concerns with Dr. Darra Lis who reviewed that she is technically a candidate for cerclage, but that it needs to be performed prior to 24 weeks. She then called our office and requested cerclage.   Today, she reports that she is a little unsure of her decision either way. She reports that her bleeding from earlier in pregnancy has resolved. She reports more pelvic pressure and a pulling sensation in her vagina, but no contractions, bleeding, or leakage of fluid. Her baby is moving normally.   Past Medical History: Past Medical History:  Diagnosis Date   Anemia    Asthma    Bipolar 1 disorder (HCC)    Blood transfusion without reported diagnosis    Chiari malformation type I (HCC)    Chronic post-traumatic stress disorder 09/19/2021   Glaucoma 09/19/2021   Heart murmur    History of postpartum hemorrhage    History of pulmonary embolism 2021   in setting of covid   Insomnia 09/19/2021   Migraine with typical aura 09/19/2021   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/2021    Pregnancy induced hypertension    Preterm labor    Sickle cell trait (HCC)    UTI (urinary tract infection)    Vaginal Pap smear, abnormal     Past Surgical History: Past Surgical History:  Procedure Laterality Date   CERVICAL BIOPSY  W/ LOOP ELECTRODE EXCISION     CERVICAL CERCLAGE     CESAREAN SECTION     CESAREAN SECTION N/A 12/30/2021   Procedure: CESAREAN SECTION;  Surgeon: Hermina Staggers, MD;  Location: [facility];  Service: Obstetrics;  Laterality: N/A;   DILATION AND CURETTAGE OF UTERUS     Obstetrical History: OB History     Gravida  12   Para  3   Term  2   Preterm  1   AB  8   Living  3      SAB  7   IAB  1   Ectopic  0   Multiple  0   Live Births  3        Obstetric Comments  Pre-E with both preg, induced with both         Social History Social History   Socioeconomic History   Marital status: Single    Spouse name: Not on file   Number of children: Not on file   Years of education: Not on file   Highest education level: Bachelor's degree (e.g., BA, AB, BS)  Occupational History   Not on file  Tobacco Use  Smoking status: Some Days    Current packs/day: 0.25    Types: Cigarettes   Smokeless tobacco: Never  Vaping Use   Vaping status: Former   Substances: Nicotine  Substance and Sexual Activity   Alcohol use: Not Currently   Drug use: Not Currently   Sexual activity: Yes    Birth control/protection: None  Other Topics Concern   Not on file  Social History Narrative   Not on file   Social Determinants of Health   Financial Resource Strain: Not on File (11/14/2022)   Received from [employer]    Financial Resource Strain: 0  Food Insecurity: Not on file (11/14/2022)  Transportation Needs: Not on File (11/14/2022)   Received from [employer] Needs    Transportation: 0  Physical Activity: Not on File (11/14/2022)   Received from [facility]   Physical Activity    Physical Activity: 0   Stress: Not on File (11/14/2022)   Received from [facility]   Stress    Stress: 0  Social Connections: Not on File (11/24/2022)   Received from [employer]   Social Connections    Connectedness: 0    Family History: Family History  Problem Relation Age of Onset   Stroke Mother    Heart disease Mother        hx open heart issues   Hypertension Mother    Diabetes Mother    Cancer Father        started as colon, w/mets   Kidney disease Father        renal failure   Diabetes Father    Cancer Maternal Grandmother    Diabetes Paternal Grandmother     Allergies: Allergies  Allergen Reactions   Iodinated Contrast Media Shortness Of Breath, Rash and Swelling   Latex Anaphylaxis   Shellfish Allergy Anaphylaxis   Iodine Hives   Penicillins Hives    No medications prior to admission.   Review of Systems   All systems reviewed and negative except as stated in HPI  Blood pressure 139/63, pulse 78, temperature 98.3 F (36.8 C), temperature source Oral, resp. rate 20, height 5\' 4"  (1.626 m), weight 93 kg, SpO2 100%, currently breastfeeding. General appearance: alert, cooperative, and no distress Lungs: normal respiratory effort Heart: regular rate Abdomen: soft, non-tender Pelvic: Performed in presence of chaperone. 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/13/2021   History of LEEP (loop electrosurgical excision procedure) of  cervix complicating pregnancy in third trimester 11/21/2021   BMI 38.0-38.9,adult 11/21/2021   Obesity complicating pregnancy in third trimester 11/21/2021   History of preterm delivery, currently pregnant in third trimester 11/21/2021   Post-traumatic stress disorder, chronic 09/19/2021   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 [id number] at [redacted]w[redacted]d here for history indicated cerclage.   We discussed the risks/benefits of the cerclage.  - Discussed that benefit is somewhat questionable. Cerclage is placed up to 24 weeks with the goal of supporting the cervix until fetus is viable or able to live outside of mother's body. She is 23 weeks today with a normal cervical length and exam and she has had a term delivery without a cerclage which points away from the diagnosis of cervical insufficiency. Her initial cerclage was placed in her G1 pregnancy and it is unclear if she had true cervical insufficiency at that point as well. We discussed that bleeding in pregnancy related to abdominal trauma would be worrisome for a placental abruption, but that cerclage placement will not treat placental abruption. Her symptoms from that fall have resolved, so suspicion for abruption is low at this time. That being said, she is a candidate for history indicated cerclage and we could perform one today in hopes of supporting her pregnancy to full term.  - We discussed risks of cerclage include but are not limited to pain, infection, bleeding/hemorrhage, and PPROM. Discussed that more serious complications (IAI, hemorrhage, PPROM) are rare, but are slightly more  common at this gestational age compared with earlier in pregnancy.   After discussion of risks and benefits, patient opted to continue expectant management. She will follow up as scheduled on 9/27 for cervical length and on 10/1 for ROB.   Dispo: discharge home in good condition  Lennart Pall, MD  12/06/2022, 2:04 PM

## 2022-12-07 ENCOUNTER — Ambulatory Visit: Payer: No Typology Code available for payment source

## 2022-12-08 ENCOUNTER — Ambulatory Visit: Payer: No Typology Code available for payment source

## 2022-12-08 ENCOUNTER — Ambulatory Visit: Payer: No Typology Code available for payment source | Attending: Obstetrics and Gynecology

## 2022-12-12 ENCOUNTER — Ambulatory Visit (INDEPENDENT_AMBULATORY_CARE_PROVIDER_SITE_OTHER): Payer: No Typology Code available for payment source | Admitting: Obstetrics and Gynecology

## 2022-12-12 ENCOUNTER — Ambulatory Visit (HOSPITAL_COMMUNITY): Payer: No Typology Code available for payment source

## 2022-12-12 ENCOUNTER — Other Ambulatory Visit: Payer: Self-pay

## 2022-12-12 VITALS — BP 130/72 | HR 71 | Wt 207.9 lb

## 2022-12-12 DIAGNOSIS — O0992 Supervision of high risk pregnancy, unspecified, second trimester: Secondary | ICD-10-CM

## 2022-12-12 DIAGNOSIS — Z3009 Encounter for other general counseling and advice on contraception: Secondary | ICD-10-CM

## 2022-12-12 DIAGNOSIS — Z98891 History of uterine scar from previous surgery: Secondary | ICD-10-CM

## 2022-12-12 DIAGNOSIS — Z86711 Personal history of pulmonary embolism: Secondary | ICD-10-CM

## 2022-12-12 DIAGNOSIS — O0990 Supervision of high risk pregnancy, unspecified, unspecified trimester: Secondary | ICD-10-CM

## 2022-12-12 DIAGNOSIS — Q0700 Arnold-Chiari syndrome without spina bifida or hydrocephalus: Secondary | ICD-10-CM

## 2022-12-12 DIAGNOSIS — O10919 Unspecified pre-existing hypertension complicating pregnancy, unspecified trimester: Secondary | ICD-10-CM

## 2022-12-12 DIAGNOSIS — O3442 Maternal care for other abnormalities of cervix, second trimester: Secondary | ICD-10-CM

## 2022-12-12 DIAGNOSIS — Z9889 Other specified postprocedural states: Secondary | ICD-10-CM

## 2022-12-12 DIAGNOSIS — Z3A23 23 weeks gestation of pregnancy: Secondary | ICD-10-CM

## 2022-12-12 DIAGNOSIS — O10912 Unspecified pre-existing hypertension complicating pregnancy, second trimester: Secondary | ICD-10-CM

## 2022-12-12 DIAGNOSIS — Z8751 Personal history of pre-term labor: Secondary | ICD-10-CM

## 2022-12-12 NOTE — Progress Notes (Signed)
   PRENATAL VISIT NOTE  Subjective:  Kimberly Stark is a 32 y.o. [id number] at [redacted]w[redacted]d being seen today for ongoing prenatal care.  She is currently monitored for the following issues for this high-risk pregnancy and has History of HELLP syndrome, currently pregnant, third trimester; Supervision of high risk pregnancy, antepartum; History of cervical cerclage; History of pulmonary embolus (PE); Tobacco abuse; History of 2 cesarean sections; Cystic fibrosis carrier; Post-traumatic stress disorder, chronic; History of LEEP (loop electrosurgical excision procedure) of cervix complicating pregnancy in third trimester; BMI 38.0-38.9,adult; Obesity complicating pregnancy in third trimester; History of preterm delivery, currently pregnant in third trimester; Arnold-Chiari malformation (HCC); Habitual aborter, currently pregnant; Iron deficiency anemia; Chronic hypertension affecting pregnancy; Unwanted fertility; and History of preterm delivery on their problem list.  Patient doing well with no acute concerns today. She reports no complaints.  Contractions: Not present. Vag. Bleeding: None.  Movement: Present. Denies leaking of fluid.   The following portions of the patient's history were reviewed and updated as appropriate: allergies, current medications, past family history, past medical history, past social history, past surgical history and problem list. Problem list updated.  Objective:   Vitals:   12/12/22 1141  BP: 130/72  Pulse: 71  Weight: 207 lb 14.4 oz (94.3 kg)    Fetal Status: Fetal Heart Rate (bpm): 142 Fundal Height: 24 cm Movement: Present     General:  Alert, oriented and cooperative. Patient is in no acute distress.  Skin: Skin is warm and dry. No rash noted.   Cardiovascular: Normal heart rate noted  Respiratory: Normal respiratory effort, no problems with respiration noted  Abdomen: Soft, gravid, appropriate for gestational age.  Pain/Pressure: Present     Pelvic: Cervical exam  deferred        Extremities: Normal range of motion.  Edema: Trace  Mental Status:  Normal mood and affect. Normal behavior. Normal judgment and thought content.   Assessment and Plan:  Pregnancy: [id number] at [redacted]w[redacted]d  1. [redacted] weeks gestation of pregnancy   2. Chronic hypertension affecting pregnancy Bp well controlled with current meds  3. Arnold-Chiari malformation (HCC) PT will need anesthesia consult prior to next c section  4. History of LEEP (loop electrosurgical excision procedure) of cervix complicating pregnancy in third trimester   5. History of 2 cesarean sections Pt desires repeat with BTL  6. History of cervical cerclage Due to cervical length and current gestational age, no cerclage at this time  7. History of preterm delivery No s/sx of preterm labor  8. History of pulmonary embolus (PE) Pt currently on lovenox, will need  post delivery lovenox as well  9. Supervision of high risk pregnancy, antepartum Continue routine prenatal care  10. Unwanted fertility Pt desires BTL, advised signing tubal forms  Preterm labor symptoms and general obstetric precautions including but not limited to vaginal bleeding, contractions, leaking of fluid and fetal movement were reviewed in detail with the patient.  Please refer to After Visit Summary for other counseling recommendations.   Return in about 3 weeks (around 01/02/2023) for [facility], in person, 2 hr GTT, 3rd trim labs.   Mariel Aloe, MD Faculty Attending Center for [facility]

## 2022-12-13 ENCOUNTER — Ambulatory Visit: Payer: No Typology Code available for payment source | Admitting: Cardiology

## 2022-12-15 ENCOUNTER — Other Ambulatory Visit: Payer: Self-pay | Admitting: *Deleted

## 2022-12-15 ENCOUNTER — Ambulatory Visit: Payer: No Typology Code available for payment source | Attending: Obstetrics and Gynecology

## 2022-12-15 DIAGNOSIS — O99891 Other specified diseases and conditions complicating pregnancy: Secondary | ICD-10-CM | POA: Diagnosis not present

## 2022-12-15 DIAGNOSIS — F319 Bipolar disorder, unspecified: Secondary | ICD-10-CM

## 2022-12-15 DIAGNOSIS — O09292 Supervision of pregnancy with other poor reproductive or obstetric history, second trimester: Secondary | ICD-10-CM

## 2022-12-15 DIAGNOSIS — J45909 Unspecified asthma, uncomplicated: Secondary | ICD-10-CM

## 2022-12-15 DIAGNOSIS — O09212 Supervision of pregnancy with history of pre-term labor, second trimester: Secondary | ICD-10-CM

## 2022-12-15 DIAGNOSIS — O10012 Pre-existing essential hypertension complicating pregnancy, second trimester: Secondary | ICD-10-CM

## 2022-12-15 DIAGNOSIS — Q0700 Arnold-Chiari syndrome without spina bifida or hydrocephalus: Secondary | ICD-10-CM

## 2022-12-15 DIAGNOSIS — O09899 Supervision of other high risk pregnancies, unspecified trimester: Secondary | ICD-10-CM

## 2022-12-15 DIAGNOSIS — O99212 Obesity complicating pregnancy, second trimester: Secondary | ICD-10-CM | POA: Insufficient documentation

## 2022-12-15 DIAGNOSIS — Z3689 Encounter for other specified antenatal screening: Secondary | ICD-10-CM | POA: Insufficient documentation

## 2022-12-15 DIAGNOSIS — O10912 Unspecified pre-existing hypertension complicating pregnancy, second trimester: Secondary | ICD-10-CM

## 2022-12-15 DIAGNOSIS — Z141 Cystic fibrosis carrier: Secondary | ICD-10-CM

## 2022-12-15 DIAGNOSIS — O285 Abnormal chromosomal and genetic finding on antenatal screening of mother: Secondary | ICD-10-CM

## 2022-12-15 DIAGNOSIS — O99342 Other mental disorders complicating pregnancy, second trimester: Secondary | ICD-10-CM

## 2022-12-15 DIAGNOSIS — Z3A24 24 weeks gestation of pregnancy: Secondary | ICD-10-CM

## 2022-12-15 DIAGNOSIS — O99512 Diseases of the respiratory system complicating pregnancy, second trimester: Secondary | ICD-10-CM

## 2022-12-19 ENCOUNTER — Other Ambulatory Visit (HOSPITAL_COMMUNITY): Payer: No Typology Code available for payment source

## 2022-12-26 ENCOUNTER — Ambulatory Visit (HOSPITAL_COMMUNITY): Payer: No Typology Code available for payment source | Attending: Cardiology

## 2022-12-26 DIAGNOSIS — R0789 Other chest pain: Secondary | ICD-10-CM | POA: Insufficient documentation

## 2022-12-26 LAB — ECHOCARDIOGRAM COMPLETE
Area-P 1/2: 3.83 cm2
S' Lateral: 3.2 cm

## 2023-01-02 ENCOUNTER — Other Ambulatory Visit: Payer: No Typology Code available for payment source

## 2023-01-02 ENCOUNTER — Ambulatory Visit: Payer: No Typology Code available for payment source | Admitting: Family Medicine

## 2023-01-02 ENCOUNTER — Other Ambulatory Visit: Payer: Self-pay

## 2023-01-02 ENCOUNTER — Encounter: Payer: Self-pay | Admitting: Family Medicine

## 2023-01-02 VITALS — BP 146/76 | HR 80 | Wt 213.0 lb

## 2023-01-02 DIAGNOSIS — O0990 Supervision of high risk pregnancy, unspecified, unspecified trimester: Secondary | ICD-10-CM

## 2023-01-02 DIAGNOSIS — O09292 Supervision of pregnancy with other poor reproductive or obstetric history, second trimester: Secondary | ICD-10-CM

## 2023-01-02 DIAGNOSIS — O10912 Unspecified pre-existing hypertension complicating pregnancy, second trimester: Secondary | ICD-10-CM

## 2023-01-02 DIAGNOSIS — O3442 Maternal care for other abnormalities of cervix, second trimester: Secondary | ICD-10-CM

## 2023-01-02 DIAGNOSIS — O09893 Supervision of other high risk pregnancies, third trimester: Secondary | ICD-10-CM

## 2023-01-02 DIAGNOSIS — Z3009 Encounter for other general counseling and advice on contraception: Secondary | ICD-10-CM

## 2023-01-02 DIAGNOSIS — O99212 Obesity complicating pregnancy, second trimester: Secondary | ICD-10-CM

## 2023-01-02 DIAGNOSIS — Z3A26 26 weeks gestation of pregnancy: Secondary | ICD-10-CM

## 2023-01-02 DIAGNOSIS — Z86711 Personal history of pulmonary embolism: Secondary | ICD-10-CM

## 2023-01-02 DIAGNOSIS — O3443 Maternal care for other abnormalities of cervix, third trimester: Secondary | ICD-10-CM

## 2023-01-02 DIAGNOSIS — O10919 Unspecified pre-existing hypertension complicating pregnancy, unspecified trimester: Secondary | ICD-10-CM

## 2023-01-02 DIAGNOSIS — R12 Heartburn: Secondary | ICD-10-CM

## 2023-01-02 DIAGNOSIS — Z98891 History of uterine scar from previous surgery: Secondary | ICD-10-CM

## 2023-01-02 DIAGNOSIS — Q0700 Arnold-Chiari syndrome without spina bifida or hydrocephalus: Secondary | ICD-10-CM

## 2023-01-02 DIAGNOSIS — O219 Vomiting of pregnancy, unspecified: Secondary | ICD-10-CM

## 2023-01-02 DIAGNOSIS — Z720 Tobacco use: Secondary | ICD-10-CM

## 2023-01-02 DIAGNOSIS — Z9889 Other specified postprocedural states: Secondary | ICD-10-CM

## 2023-01-02 DIAGNOSIS — O09293 Supervision of pregnancy with other poor reproductive or obstetric history, third trimester: Secondary | ICD-10-CM

## 2023-01-02 DIAGNOSIS — O26892 Other specified pregnancy related conditions, second trimester: Secondary | ICD-10-CM

## 2023-01-02 DIAGNOSIS — O99213 Obesity complicating pregnancy, third trimester: Secondary | ICD-10-CM

## 2023-01-02 DIAGNOSIS — F4312 Post-traumatic stress disorder, chronic: Secondary | ICD-10-CM

## 2023-01-02 MED ORDER — ENOXAPARIN SODIUM 40 MG/0.4ML IJ SOSY
40.0000 mg | PREFILLED_SYRINGE | INTRAMUSCULAR | 5 refills | Status: DC
Start: 2023-01-02 — End: 2023-04-10

## 2023-01-02 MED ORDER — LABETALOL HCL 300 MG PO TABS
300.0000 mg | ORAL_TABLET | Freq: Three times a day (TID) | ORAL | 3 refills | Status: DC
Start: 2023-01-02 — End: 2023-02-14

## 2023-01-02 MED ORDER — PROMETHAZINE HCL 25 MG PO TABS
25.0000 mg | ORAL_TABLET | Freq: Four times a day (QID) | ORAL | 1 refills | Status: DC | PRN
Start: 2023-01-02 — End: 2023-02-20

## 2023-01-02 MED ORDER — PANTOPRAZOLE SODIUM 40 MG PO TBEC: 40.0000 mg | DELAYED_RELEASE_TABLET | Freq: Every day | ORAL | 5 refills | Status: AC

## 2023-01-02 NOTE — Patient Instructions (Signed)

## 2023-01-02 NOTE — Progress Notes (Signed)
Subjective:  Kimberly Stark is a 32 y.o. [id number] at [redacted]w[redacted]d being seen today for ongoing prenatal care.  She is currently monitored for the following issues for this high-risk pregnancy and has History of HELLP syndrome, currently pregnant, third trimester; Supervision of high risk pregnancy, antepartum; History of cervical cerclage; History of pulmonary embolus (PE); Tobacco abuse; History of 2 cesarean sections; Cystic fibrosis carrier; Post-traumatic stress disorder, chronic; History of LEEP (loop electrosurgical excision procedure) of cervix complicating pregnancy in third trimester; BMI 38.0-38.9,adult; Obesity complicating pregnancy in third trimester; History of preterm delivery, currently pregnant in third trimester; Arnold-Chiari malformation (HCC); Iron deficiency anemia; Chronic hypertension affecting pregnancy; Unwanted fertility; and History of preterm delivery on their problem list.  Patient reports no complaints.  Contractions: Not present. Vag. Bleeding: None.  Movement: Present. Denies leaking of fluid.   The following portions of the patient's history were reviewed and updated as appropriate: allergies, current medications, past family history, past medical history, past social history, past surgical history and problem list. Problem list updated.  Objective:   Vitals:   01/02/23 0945 01/02/23 1014  BP: (!) 144/81 (!) 146/76  Pulse: 80 80  Weight: 213 lb (96.6 kg)     Fetal Status: Fetal Heart Rate (bpm): 138   Movement: Present     General:  Alert, oriented and cooperative. Patient is in no acute distress.  Skin: Skin is warm and dry. No rash noted.   Cardiovascular: Normal heart rate noted  Respiratory: Normal respiratory effort, no problems with respiration noted  Abdomen: Soft, gravid, appropriate for gestational age. Pain/Pressure: Present (Pressure)     Pelvic: Vag. Bleeding: None Vag D/C Character: White   Cervical exam deferred        Extremities: Normal range of  motion.  Edema: Trace  Mental Status: Normal mood and affect. Normal behavior. Normal judgment and thought content.   Urinalysis:      Assessment and Plan:  Pregnancy: [id number] at [redacted]w[redacted]d  1. Supervision of high risk pregnancy, antepartum BP elevated see below FHR normal  2. Unwanted fertility BTL consent signed today but she is now unsure about it Partner considering vasectomy Has gotten pregnant on implanon, depo, and Mirena, does not want to onsider non-surgical options  3. Tobacco abuse Reports a lot of stress, mother just passed away unexpectedly Has been smoking about 1/2 pack a day, discussed benefits of quitting  4. Post-traumatic stress disorder, chronic Mom died unexpectedly this week Accepts IBH referral today, also getting resources through the [place]  5. Obesity affecting pregnancy in third trimester, unspecified obesity type   6. History of pulmonary embolus (PE) Per chart in 2021 with COVID infection On lovenox 40 daily, refill sent  7. History of preterm delivery, currently pregnant in third trimester Iatrogenic at 30 and 34 weeks in setting of HELLP/PreE  8. History of LEEP (loop electrosurgical excision procedure) of cervix complicating pregnancy in third trimester Per chart in 2008 and 2012  9. History of HELLP syndrome, currently pregnant, third trimester On ASA 162mg  daily  10. History of cervical cerclage Declined in this pregnancy after counseling at 23 weeks  11. History of 2 cesarean sections Desres RCS+BTL Per MFM note on 9/18 likely around 37 weeks After discussion scheduled for March 15, 2023  12. Chronic hypertension affecting pregnancy Elevated BP on labetalol 200 mg TID On ASA Increase labetalol to 300 mg TID  13. Arnold-Chiari malformation [facility]) Message sent to Dr. Armond Stark to discuss anesthesia consult per Dr. Rich Stark recommendation  Preterm labor symptoms and general obstetric precautions including but not limited to vaginal  bleeding, contractions, leaking of fluid and fetal movement were reviewed in detail with the patient. Please refer to After Visit Summary for other counseling recommendations.  Return in 2 weeks (on 01/16/2023) for [facility], ob visit, needs MD.   Kimberly Maples, MD

## 2023-01-03 ENCOUNTER — Telehealth: Payer: Self-pay

## 2023-01-03 ENCOUNTER — Encounter: Payer: Self-pay | Admitting: Family Medicine

## 2023-01-03 DIAGNOSIS — Z8632 Personal history of gestational diabetes: Secondary | ICD-10-CM | POA: Insufficient documentation

## 2023-01-03 DIAGNOSIS — O24419 Gestational diabetes mellitus in pregnancy, unspecified control: Secondary | ICD-10-CM | POA: Insufficient documentation

## 2023-01-03 LAB — CBC
Hematocrit: 33 % — ABNORMAL LOW (ref 34.0–46.6)
Hemoglobin: 10.4 g/dL — ABNORMAL LOW (ref 11.1–15.9)
MCH: 27 pg (ref 26.6–33.0)
MCHC: 31.5 g/dL (ref 31.5–35.7)
MCV: 86 fL (ref 79–97)
Platelets: 388 10*3/uL (ref 150–450)
RBC: 3.85 x10E6/uL (ref 3.77–5.28)
RDW: 13.3 % (ref 11.7–15.4)
WBC: 9.9 10*3/uL (ref 3.4–10.8)

## 2023-01-03 LAB — GLUCOSE TOLERANCE, 2 HOURS W/ 1HR
Glucose, 1 hour: 159 mg/dL (ref 70–179)
Glucose, 2 hour: 130 mg/dL (ref 70–152)
Glucose, Fasting: 97 mg/dL — ABNORMAL HIGH (ref 70–91)

## 2023-01-03 LAB — RPR: RPR Ser Ql: NONREACTIVE

## 2023-01-03 LAB — HIV ANTIBODY (ROUTINE TESTING W REFLEX): HIV Screen 4th Generation wRfx: NONREACTIVE

## 2023-01-03 NOTE — Telephone Encounter (Addendum)
-----   Message from Venora Maples sent at 01/03/2023 12:32 PM EDT ----- 28 wk labs show GDM Please send testing supplies and coordinate GDM teaching  Left message for patient to call the office for results and f/u appt.   Leonette Nutting  01/03/23

## 2023-01-05 MED ORDER — ACCU-CHEK GUIDE W/DEVICE KIT: 1.0000 | PACK | Freq: Once | 0 refills | Status: DC

## 2023-01-05 MED ORDER — ACCU-CHEK GUIDE W/DEVICE KIT
1.0000 | PACK | Freq: Once | 0 refills | Status: AC
Start: 2023-01-05 — End: 2023-01-05

## 2023-01-05 MED ORDER — ACCU-CHEK GUIDE VI STRP: ORAL_STRIP | 12 refills | Status: DC

## 2023-01-05 MED ORDER — ACCU-CHEK SOFTCLIX LANCETS MISC: 11 refills | Status: DC

## 2023-01-05 NOTE — Telephone Encounter (Signed)
Called patient with results of GDM, she reports that she did see message from provider.   Patient asked for supplies to go through [place] with request for urgency so they will send sooner.   Schedule Diabetes Education appointment. Patient aware of location, date and time.   Reviewed timing of checking blood sugars and to being meter and supplies to her Education appointment.   Patient voiced understanding with no questions or concerns at this time.

## 2023-01-05 NOTE — Addendum Note (Signed)
Addended by: Ed Blalock on: 01/05/2023 12:11 PM   Modules accepted: Orders

## 2023-01-09 ENCOUNTER — Other Ambulatory Visit: Payer: No Typology Code available for payment source

## 2023-01-12 ENCOUNTER — Ambulatory Visit: Payer: No Typology Code available for payment source

## 2023-01-15 ENCOUNTER — Encounter: Payer: Self-pay | Admitting: *Deleted

## 2023-01-15 ENCOUNTER — Other Ambulatory Visit: Payer: Self-pay

## 2023-01-15 ENCOUNTER — Ambulatory Visit: Payer: No Typology Code available for payment source | Attending: Maternal & Fetal Medicine

## 2023-01-15 ENCOUNTER — Ambulatory Visit: Payer: No Typology Code available for payment source | Admitting: *Deleted

## 2023-01-15 DIAGNOSIS — O10013 Pre-existing essential hypertension complicating pregnancy, third trimester: Secondary | ICD-10-CM | POA: Diagnosis not present

## 2023-01-15 DIAGNOSIS — E669 Obesity, unspecified: Secondary | ICD-10-CM

## 2023-01-15 DIAGNOSIS — O24410 Gestational diabetes mellitus in pregnancy, diet controlled: Secondary | ICD-10-CM | POA: Diagnosis not present

## 2023-01-15 DIAGNOSIS — Z141 Cystic fibrosis carrier: Secondary | ICD-10-CM

## 2023-01-15 DIAGNOSIS — O99013 Anemia complicating pregnancy, third trimester: Secondary | ICD-10-CM

## 2023-01-15 DIAGNOSIS — O24419 Gestational diabetes mellitus in pregnancy, unspecified control: Secondary | ICD-10-CM

## 2023-01-15 DIAGNOSIS — O10912 Unspecified pre-existing hypertension complicating pregnancy, second trimester: Secondary | ICD-10-CM | POA: Diagnosis present

## 2023-01-15 DIAGNOSIS — O09293 Supervision of pregnancy with other poor reproductive or obstetric history, third trimester: Secondary | ICD-10-CM

## 2023-01-15 DIAGNOSIS — O09213 Supervision of pregnancy with history of pre-term labor, third trimester: Secondary | ICD-10-CM

## 2023-01-15 DIAGNOSIS — O99213 Obesity complicating pregnancy, third trimester: Secondary | ICD-10-CM

## 2023-01-15 DIAGNOSIS — O09899 Supervision of other high risk pregnancies, unspecified trimester: Secondary | ICD-10-CM | POA: Diagnosis present

## 2023-01-15 DIAGNOSIS — F17210 Nicotine dependence, cigarettes, uncomplicated: Secondary | ICD-10-CM

## 2023-01-15 DIAGNOSIS — Z3A28 28 weeks gestation of pregnancy: Secondary | ICD-10-CM

## 2023-01-15 DIAGNOSIS — O99333 Smoking (tobacco) complicating pregnancy, third trimester: Secondary | ICD-10-CM

## 2023-01-15 DIAGNOSIS — O2623 Pregnancy care for patient with recurrent pregnancy loss, third trimester: Secondary | ICD-10-CM

## 2023-01-16 ENCOUNTER — Other Ambulatory Visit: Payer: Self-pay

## 2023-01-16 ENCOUNTER — Other Ambulatory Visit: Payer: Self-pay | Admitting: *Deleted

## 2023-01-16 ENCOUNTER — Telehealth: Payer: Self-pay | Admitting: Family Medicine

## 2023-01-16 ENCOUNTER — Encounter: Payer: Self-pay | Admitting: Family Medicine

## 2023-01-16 ENCOUNTER — Other Ambulatory Visit: Payer: No Typology Code available for payment source

## 2023-01-16 ENCOUNTER — Ambulatory Visit (INDEPENDENT_AMBULATORY_CARE_PROVIDER_SITE_OTHER): Payer: No Typology Code available for payment source | Admitting: Family Medicine

## 2023-01-16 VITALS — BP 127/84 | HR 91 | Wt 213.4 lb

## 2023-01-16 DIAGNOSIS — O24419 Gestational diabetes mellitus in pregnancy, unspecified control: Secondary | ICD-10-CM

## 2023-01-16 DIAGNOSIS — O10913 Unspecified pre-existing hypertension complicating pregnancy, third trimester: Secondary | ICD-10-CM

## 2023-01-16 DIAGNOSIS — O99213 Obesity complicating pregnancy, third trimester: Secondary | ICD-10-CM

## 2023-01-16 DIAGNOSIS — Z9889 Other specified postprocedural states: Secondary | ICD-10-CM

## 2023-01-16 DIAGNOSIS — F4312 Post-traumatic stress disorder, chronic: Secondary | ICD-10-CM

## 2023-01-16 DIAGNOSIS — Z720 Tobacco use: Secondary | ICD-10-CM

## 2023-01-16 DIAGNOSIS — Z3A28 28 weeks gestation of pregnancy: Secondary | ICD-10-CM

## 2023-01-16 DIAGNOSIS — Z98891 History of uterine scar from previous surgery: Secondary | ICD-10-CM

## 2023-01-16 DIAGNOSIS — O24410 Gestational diabetes mellitus in pregnancy, diet controlled: Secondary | ICD-10-CM

## 2023-01-16 DIAGNOSIS — O09293 Supervision of pregnancy with other poor reproductive or obstetric history, third trimester: Secondary | ICD-10-CM

## 2023-01-16 DIAGNOSIS — Z86711 Personal history of pulmonary embolism: Secondary | ICD-10-CM

## 2023-01-16 DIAGNOSIS — Q0700 Arnold-Chiari syndrome without spina bifida or hydrocephalus: Secondary | ICD-10-CM

## 2023-01-16 DIAGNOSIS — O09899 Supervision of other high risk pregnancies, unspecified trimester: Secondary | ICD-10-CM

## 2023-01-16 DIAGNOSIS — O0990 Supervision of high risk pregnancy, unspecified, unspecified trimester: Secondary | ICD-10-CM

## 2023-01-16 DIAGNOSIS — Z3009 Encounter for other general counseling and advice on contraception: Secondary | ICD-10-CM

## 2023-01-16 DIAGNOSIS — O3443 Maternal care for other abnormalities of cervix, third trimester: Secondary | ICD-10-CM

## 2023-01-16 DIAGNOSIS — O10919 Unspecified pre-existing hypertension complicating pregnancy, unspecified trimester: Secondary | ICD-10-CM

## 2023-01-16 DIAGNOSIS — O09893 Supervision of other high risk pregnancies, third trimester: Secondary | ICD-10-CM

## 2023-01-16 MED ORDER — ALCOHOL SWABS 70 % PADS: 1.0000 | MEDICATED_PAD | 11 refills | Status: DC | PRN

## 2023-01-16 MED ORDER — BAND-AID FLEXIBLE MISC: 1.0000 | 11 refills | Status: DC | PRN

## 2023-01-16 NOTE — BH Specialist Note (Signed)
Integrated Behavioral Health via Telemedicine Visit  01/26/2023 Kimberly Stark [id number]  Number of Integrated Behavioral Health Clinician visits: 1- Initial Visit  Session Start time: [phone]   Session End time: 1018  Total time in minutes: 62   Referring Provider: Merian Capron, MD Patient/Family location: Home [facility] Provider location: [facility]  All persons participating in visit: Patient Kimberly Stark and Kimberly Stark   Types of Service: Individual psychotherapy and Video visit  I connected with [name] and/or Christell Constant Roddey's  n/a  via  Telephone or Video Enabled Telemedicine Application  (Video is Caregility application) and verified that I am speaking with the correct person using two identifiers. Discussed confidentiality: Yes   I discussed the limitations of telemedicine and the availability of in person appointments.  Discussed there is a possibility of technology failure and discussed alternative modes of communication if that failure occurs.  I discussed that engaging in this telemedicine visit, they consent to the provision of behavioral healthcare and the services will be billed under their insurance.  Patient and/or legal guardian expressed understanding and consented to Telemedicine visit: Yes   Presenting Concerns: Patient and/or family reports the following symptoms/concerns: Was taking Abilify, Klonopin, Trazadone prior to positive pregnancy; [facility] all these due to pregnancy; was referred to discuss medication safe in pregnancy with her OB providers; pt has implementing many self-coping strategies that have helped manage mood up until her mother passed unexpectedly last month; pt requests safe medication for remaining of pregnancy; goal is to prevent manic and/or depressive episode. Pt continues to see current therapist weekly through the [place]. Duration of problem: Loss of  mother less than one month; ongoing BH issues; Severity of problem:  moderately severe  Patient and/or Family's Strengths/Protective Factors: Social connections and Sense of purpose  Goals Addressed: Patient will:  Reduce symptoms of: anxiety, depression, insomnia, mood instability, and stress    Demonstrate ability to: Increase healthy adjustment to current life circumstances and Begin healthy grieving over loss  Progress towards Goals: Ongoing  Interventions: Interventions utilized:  Psychoeducation and/or Health Education, Link to [employer], and Supportive Reflection Standardized Assessments completed: GAD-7 and PHQ 9  Patient and/or Family Response: Patient agrees with treatment plan.   Assessment: Patient currently experiencing Grief, Bipolar 1 disorder; PTSD; Psychosocial stress.   Patient may benefit from psychoeducation and brief therapeutic interventions regarding coping with symptoms of anxiety, depression, panic, insomnia, mood instability, life stress and current grief .  Plan: Follow up with behavioral health clinician on : Call Park Beck at [phone], as needed. Behavioral recommendations:  -Continue taking prenatal vitamin as prescribed -Continue seeing ongoing therapist weekly -Discuss BH medication options safe in pregnancy at upcoming medical appointment  -Continue plan to [facility] -Continue using [employer]; consider additional resources on After Visit Summary, as discussed Referral(s): Integrated Art gallery manager (In Clinic) and [employer] Resources:  childcare; new parent support; postpartum planner; women's resource  I discussed the assessment and treatment plan with the patient and/or parent/guardian. They were provided an opportunity to ask questions and all were answered. They agreed with the plan and demonstrated an understanding of the instructions.   They were advised to call back or  seek an in-person evaluation if the symptoms worsen or if the condition fails to improve as anticipated.  Rae Lips, LCSW     01/26/2023    9:40 AM 09/22/2022   10:45 AM  12/26/2021    1:42 PM 12/15/2021    4:23 PM 12/01/2021    5:20 PM  Depression screen PHQ 2/9  Decreased Interest 3 1 0 1 0  Down, Depressed, Hopeless 1 1 0 1 0  PHQ - 2 Score 4 2 0 2 0  Altered sleeping 3 3 1 1 2   Tired, decreased energy 3 1 1 1 2   Change in appetite 1 1 1 2 1   Feeling bad or failure about yourself  1 0 0 0 0  Trouble concentrating 0 0 0 0 0  Moving slowly or fidgety/restless 0 0 0 0 0  Suicidal thoughts 0 0 0 0 0  PHQ-9 Score 12 7 3 6 5       01/26/2023    9:43 AM 09/22/2022   10:45 AM 12/26/2021    1:42 PM 12/15/2021    4:24 PM  GAD 7 : Generalized Anxiety Score  Nervous, Anxious, on Edge 2 1 1 1   Control/stop worrying 3 1 0 1  Worry too much - different things 2 1 1 1   Trouble relaxing 0 1 1 1   Restless 0 0 0 1  Easily annoyed or irritable 3 0 0 1  Afraid - awful might happen 1 0 0 1  Total GAD 7 Score 11 4 3  7

## 2023-01-16 NOTE — Telephone Encounter (Signed)
Patient refused education appointment.

## 2023-01-16 NOTE — Patient Instructions (Signed)

## 2023-01-16 NOTE — Progress Notes (Signed)
Subjective:  Kimberly Stark is a 32 y.o. [id number] at [redacted]w[redacted]d being seen today for ongoing prenatal care.  She is currently monitored for the following issues for this high-risk pregnancy and has History of HELLP syndrome, currently pregnant, third trimester; Supervision of high risk pregnancy, antepartum; History of cervical cerclage; History of pulmonary embolus (PE); Tobacco abuse; History of 2 cesarean sections; Cystic fibrosis carrier; Post-traumatic stress disorder, chronic; History of LEEP (loop electrosurgical excision procedure) of cervix complicating pregnancy in third trimester; BMI 38.0-38.9,adult; Obesity complicating pregnancy in third trimester; History of preterm delivery, currently pregnant in third trimester; Arnold-Chiari malformation (HCC); Iron deficiency anemia; Chronic hypertension affecting pregnancy; Unwanted fertility; History of preterm delivery; and GDM (gestational diabetes mellitus) on their problem list.  Patient reports no complaints.  Contractions: Not present. Vag. Bleeding: None.  Movement: Present. Denies leaking of fluid.   The following portions of the patient's history were reviewed and updated as appropriate: allergies, current medications, past family history, past medical history, past social history, past surgical history and problem list. Problem list updated.  Objective:   Vitals:   01/16/23 0839  BP: 127/84  Pulse: 91  Weight: 213 lb 6.4 oz (96.8 kg)    Fetal Status: Fetal Heart Rate (bpm): 138   Movement: Present     General:  Alert, oriented and cooperative. Patient is in no acute distress.  Skin: Skin is warm and dry. No rash noted.   Cardiovascular: Normal heart rate noted  Respiratory: Normal respiratory effort, no problems with respiration noted  Abdomen: Soft, gravid, appropriate for gestational age. Pain/Pressure: Absent     Pelvic: Vag. Bleeding: None     Cervical exam deferred        Extremities: Normal range of motion.  Edema: Trace   Mental Status: Normal mood and affect. Normal behavior. Normal judgment and thought content.   Urinalysis:      Assessment and Plan:  Pregnancy: [id number] at [redacted]w[redacted]d  1. Supervision of high risk pregnancy, antepartum BP and FHR normal  2. Unwanted fertility BTL consent signed 01/02/2023 Unsure at last visit, now is firm in her decision  3. Tobacco abuse Not addressed at this visit  4. Post-traumatic stress disorder, chronic Going to [facility] and getting resources through [place], mother recently died unexpectedly  5. Obesity affecting pregnancy in third trimester, unspecified obesity type   6. History of pulmonary embolus (PE) Per chart in 2021 with COVID infection On lovenox 40 daily, refill sent   7. History of preterm delivery, currently pregnant in third trimester Iatrogenic at 30 and 34 weeks in setting of HELLP/PreE   8. History of LEEP (loop electrosurgical excision procedure) of cervix complicating pregnancy in third trimester Per chart in 2008 and 2012   9. History of HELLP syndrome, currently pregnant, third trimester On ASA 162mg  daily   10. History of cervical cerclage Declined in this pregnancy after counseling at 23 weeks  11. History of 2 cesarean sections Scheduled for RCS+BTL on 03/15/2023  12. Diet controlled gestational diabetes mellitus (GDM) in third trimester Going to DM education later today Reports she has been taking her sugars and they have been good Last growth [place] 01/15/23, EFW 20%, AC 30%, AFI 17 Antenatal testing to start at 32 weeks  13. Chronic hypertension affecting pregnancy Normotensive on labetalol 300 mg TID, taking ASA  14. Arnold-Chiari malformation [facility]) Discussed with Dr. Armond Hang from anesthesia, she reports no concerns regarding delivery as she has had multiple spinals previously  Preterm labor symptoms and general  obstetric precautions including but not limited to vaginal bleeding, contractions, leaking of fluid and fetal movement were  reviewed in detail with the patient. Please refer to After Visit Summary for other counseling recommendations.  Return in 2 weeks (on 01/30/2023) for [facility], ob visit, needs MD.   Venora Maples, MD

## 2023-01-18 ENCOUNTER — Ambulatory Visit: Payer: No Typology Code available for payment source | Admitting: Cardiology

## 2023-01-26 ENCOUNTER — Ambulatory Visit (INDEPENDENT_AMBULATORY_CARE_PROVIDER_SITE_OTHER): Payer: Medicaid Other | Admitting: Clinical

## 2023-01-26 DIAGNOSIS — F4312 Post-traumatic stress disorder, chronic: Secondary | ICD-10-CM

## 2023-01-26 DIAGNOSIS — F319 Bipolar disorder, unspecified: Secondary | ICD-10-CM

## 2023-01-26 DIAGNOSIS — F4321 Adjustment disorder with depressed mood: Secondary | ICD-10-CM | POA: Diagnosis not present

## 2023-01-26 DIAGNOSIS — Z658 Other specified problems related to psychosocial circumstances: Secondary | ICD-10-CM

## 2023-01-26 NOTE — Patient Instructions (Addendum)
Center for [facility] at [facility] [address] [place], [place] [address] [phone] (main office) [phone] [facility] office)  New Parent Support Groups www.postpartum.net  (includes support for [employer] moms/families) www.conehealthybaby.com  Estate manager/land agent  (Childcare options, Early childcare development, etc.) DietDisorder.cz  [employer] Child Care Facility Search Engine  https://ncchildcare.http://cook.com/  [employer] www.womenscentergso.org      BRAINSTORMING  Develop a Plan Goals: Provide a way to start conversation about your new life with a baby Assist parents in recognizing and using resources within their reach Help pave the way before birth for an easier period of transition afterwards.  Make a list of the following information to keep in a central location: Full name of Mom and Partner: _____________________________________________ Baby's full name and Date of Birth: ___________________________________________ Home Address: ___________________________________________________________ ________________________________________________________________________ Home Phone: ____________________________________________________________ Parents' cell numbers: _____________________________________________________ ________________________________________________________________________ Name and contact info for OB: ______________________________________________ Name and contact info for Pediatrician:________________________________________ Contact info for Lactation Consultants: ________________________________________  REST and SLEEP *You each need at least 4-5 hours of uninterrupted sleep every day. Write specific names and contact information.* How are you going to rest in the postpartum period? While partner's home? When partner returns to work? When you both return to work? Where will  your baby sleep? Who is available to help during the day? Evening? Night? Who could move in for a period to help support you? What are some ideas to help you get enough sleep? __________________________________________________________________________________________________________________________________________________________________________________________________________________________________________ NUTRITIOUS FOOD AND DRINK *Plan for meals before your baby is born so you can have healthy food to eat during the immediate postpartum period.* Who will look after breakfast? Lunch? Dinner? List names and contact information. Brainstorm quick, healthy ideas for each meal. What can you do before baby is born to prepare meals for the postpartum period? How can others help you with meals? Which grocery stores provide online shopping and delivery? Which restaurants offer take-out or delivery options? ______________________________________________________________________________________________________________________________________________________________________________________________________________________________________________________________________________________________________________________________________________________________________________________________________  CARE FOR MOM *It's important that mom is cared for and pampered in the postpartum period. Remember, the most important ways new mothers need care are: sleep, nutrition, gentle exercise, and time off.* Who can come take care of mom during this period? Make a list of people with their contact information. List some activities that make you feel cared for, rested, and energized? Who can make sure you have opportunities to do these things? Does mom have a space of her very own within your home that's just for her? Make a "[facility]" where she can be comfortable, rest, and renew herself  daily. ______________________________________________________________________________________________________________________________________________________________________________________________________________________________________________________________________________________________________________________________________________________________________________________________________    CARE FOR AND FEEDING BABY *Knowledgeable and encouraging people will offer the best support with regard to feeding your baby.* Educate yourself and choose the best feeding option for your baby. Make a list of people who will guide, support, and be a resource for you as your care for and feed your baby. (Friends that have breastfed or are currently breastfeeding, lactation consultants, breastfeeding support groups, etc.) Consider a postpartum doula. (These websites can give you information: dona.org & https://shea.org/) Seek out local breastfeeding resources like the breastfeeding support group at [employer] or [employer]. ______________________________________________________________________________________________________________________________________________________________________________________________________________________________________________________________________________________________________________________________________________________________________________________________________  Judson Roch AND ERRANDS Who can help with a thorough cleaning before baby is born? Make a list of people who will help with housekeeping and chores, like laundry, light cleaning, dishes, bathrooms, etc. Who can run some errands for you? What can you do to make sure you are stocked with basic supplies before baby  is born? Who is going to do the  shopping? ______________________________________________________________________________________________________________________________________________________________________________________________________________________________________________________________________________________________________________________________________________________________________________________________________     Family Adjustment *Nurture yourselves.it helps parents be more loving and allows for better bonding with their child.* What sorts of things do you and partner enjoy doing together? Which activities help you to connect and strengthen your relationship? Make a list of those things. Make a list of people whom you trust to care for your baby so you can have some time together as a couple. What types of things help partner feel connected to Mom? Make a list. What needs will partner have in order to bond with baby? Other children? Who will care for them when you go into labor and while you are in the hospital? Think about what the needs of your older children might be. Who can help you meet those needs? In what ways are you helping them prepare for bringing baby home? List some specific strategies you have for family adjustment. _______________________________________________________________________________________________________________________________________________________________________________________________________________________________________________________________________________________________________________________________________________  SUPPORT *Someone who can empathize with experiences normalizes your problems and makes them more bearable.* Make a list of other friends, neighbors, and/or co-workers you know with infants (and small children, if applicable) with whom you can connect. Make a list of local or online support groups, mom groups, etc. in which you can be  involved. ______________________________________________________________________________________________________________________________________________________________________________________________________________________________________________________________________________________________________________________________________________________________________________________________________  Childcare Plans Investigate and plan for childcare if mom is returning to work. Talk about mom's concerns about her transition back to work. Talk about partner's concerns regarding this transition.  Mental Health *Your mental health is one of the highest priorities for a pregnant or postpartum mom.* 1 in 5 women experience anxiety and/or depression from the time of conception through the first year after birth. Postpartum Mood Disorders are the #1 complication of pregnancy and childbirth and the suffering experienced by these mothers is not necessary! These illnesses are temporary and respond well to treatment, which often includes self-care, social support, talk therapy, and medication when needed. Women experiencing anxiety and depression often say things like: "I'm supposed to be happy.why do I feel so sad?", "Why can't I snap out of it?", "I'm having thoughts that scare me." There is no need to be embarrassed if you are feeling these symptoms: Overwhelmed, anxious, angry, sad, guilty, irritable, hopeless, exhausted but can't sleep You are NOT alone. You are NOT to blame. With help, you WILL be well. Where can I find help? Medical professionals such as your OB, midwife, gynecologist, family practitioner, primary care provider, pediatrician, or mental health providers; [facility] support groups: Feelings After Birth, Breastfeeding Support Group, Baby and Me Group, and Fit 4 Two exercise classes. You have permission to ask for help. It will confirm your feelings, validate your experiences,  share/learn coping strategies, and gain support and encouragement as you heal. You are important! BRAINSTORM Make a list of local resources, including resources for mom and for partner. Identify support groups. Identify people to call late at night - include names and contact info. Talk with partner about perinatal mood and anxiety disorders. Talk with your OB, midwife, and doula about baby blues and about perinatal mood and anxiety disorders. Talk with your pediatrician about perinatal mood and anxiety disorders.   Support & Sanity Savers   What do you really need?  Basics In preparing for a new baby, many expectant parents spend hours shopping for baby clothes, decorating the nursery, and deciding which car seat to buy. Yet most don't think much about what the reality of parenting a newborn will be like,  and what they need to make it through that. So, here is the advice of experienced parents. We know you'll read this, and think "they're exaggerating, I don't really need that." Just trust [place] on these, OK? Plan for all of this, and if it turns out you don't need it, come back and teach [place] how you did it!  Must-Haves (Once baby's survival needs are met, make sure you attend to your own survival needs!) Sleep An average newborn sleeps 16-18 hours per day, over 6-7 sleep periods, rarely more than three hours at a time. It is normal and healthy for a newborn to wake throughout the night... but really hard on parents!! Naps. Prioritize sleep above any responsibilities like: cleaning house, visiting friends, running errands, etc.  Sleep whenever baby sleeps. If you can't nap, at least have restful times when baby eats. The more rest you get, the more patient you will be, the more emotionally stable, and better at solving problems.  Food You may not have realized it would be difficult to eat when you have a newborn. Yet, when we talk to countless new parents, they say things like "it may be 2:00 pm  when I realize I haven't had breakfast yet." Or "every time we sit down to dinner, baby needs to eat, and my food gets cold, so I don't bother to eat it." Finger food. Before your baby is born, stock up with one months' worth of food that: 1) you can eat with one hand while holding a baby, 2) doesn't need to be prepped, 3) is good hot or cold, 4) doesn't spoil when left out for a few hours, and 5) you like to eat. Think about: nuts, dried fruit, Clif bars, pretzels, jerky, gogurt, baby carrots, apples, bananas, crackers, cheez-n-crackers, string cheese, hot pockets or frozen burritos to microwave, garden burgers and breakfast pastries to put in the toaster, yogurt drinks, etc. Restaurant Menus. Make lists of your favorite restaurants & menu items. When family/friends want to help, you can give specific information without much thought. They can either bring you the food or send gift cards for just the right meals. Freezer Meals.  Take some time to make a few meals to put in the freezer ahead of time.  Easy to freeze meals can be anything such as soup, lasagna, chicken pie, or spaghetti sauce. Set up a Meal Schedule.  Ask friends and family to sign up to bring you meals during the first few weeks of being home. (It can be passed around at baby showers!) You have no idea how helpful this will be until you are in the throes of parenting.  MachineLive.it is a great website to check out. Emotional Support Know who to call when you're stressed out. Parenting a newborn is very challenging work. There are times when it totally overwhelms your normal coping abilities. EVERY NEW PARENT NEEDS TO HAVE A PLAN FOR WHO TO CALL WHEN THEY JUST CAN'T COPE ANY MORE. (And it has to be someone other than the baby's other parent!) Before your baby is born, come up with at least one person you can call for support - write their phone number down and post it on the refrigerator. Anxiety & Sadness. Baby blues are normal after  pregnancy; however, there are more severe types of anxiety & sadness which can occur and should not be ignored.  They are always treatable, but you have to take the first step by reaching out for help. [facility] offers  a "Mom Talk" group which meets every Tuesday from 10 am - 11 am.  This group is for new moms who need support and connection after their babies are born.  Call [phone].  Really, Really Helpful (Plan for them! Make sure these happen often!!) Physical Support with Taking Care of Yourselves Asking friends and family. Before your baby is born, set up a schedule of people who can come and visit and help out (or ask a friend to schedule for you). Any time someone says "let me know what I can do to help," sign them up for a day. When they get there, their job is not to take care of the baby (that's your job and your joy). Their job is to take care of you!  Postpartum doulas. If you don't have anyone you can call on for support, look into postpartum doulas:  professionals at helping parents with caring for baby, caring for themselves, getting breastfeeding started, and helping with household tasks. www.padanc.org is a helpful website for learning about doulas in our area. Peer Support / Parent Groups Why: One of the greatest ideas for new parents is to be around other new parents. Parent groups give you a chance to share and listen to others who are going through the same season of life, get a sense of what is normal infant development by watching several babies learn and grow, share your stories of triumph and struggles with empathetic ears, and forgive your own mistakes when you realize all parents are learning by trial and error. Where to find: There are many places you can meet other new parents throughout our community.  [facility] offers the following classes for new moms and their little ones:  Baby and Me (Birth to Crawling) and Breastfeeding Support Group. Go to  www.conehealthybaby.com or call [phone] for more information. Time for your Relationship It's easy to get so caught up in meeting baby's immediate needs that it's hard to find time to connect with your partner, and meet the needs of your relationship. It's also easy to forget what "quality time with your partner" actually looks like. If you take your baby on a date, you'd be amazed how much of your couple time is spent feeding the baby, diapering the baby, admiring the baby, and talking about the baby. Dating: Try to take time for just the two of you. Babysitter tip: Sometimes when moms are breastfeeding a newborn, they find it hard to figure out how to schedule outings around baby's unpredictable feeding schedules. Have the babysitter come for a three hour period. When she comes over, if baby has just eaten, you can leave right away, and come back in two hours. If baby hasn't fed recently, you start the date at home. Once baby gets hungry and gets a good feeding in, you can head out for the rest of your date time. Date Nights at Home: If you can't get out, at least set aside one evening a week to prioritize your relationship: whenever baby dozes off or doesn't have any immediate needs, spend a little time focusing on each other. Potential conflicts: The main relationship conflicts that come up for new parents are: issues related to sexuality, financial stresses, a feeling of an unfair division of household tasks, and conflicts in parenting styles. The more you can work on these issues before baby arrives, the better!  Fun and Frills (Don't forget these. and don't feel guilty for indulging in them!) Everyone has something in life that  is a fun little treat that they do just for themselves. It may be: reading the morning paper, or going for a daily jog, or having coffee with a friend once a week, or going to a movie on Friday nights, or fine chocolates, or bubble baths, or curling up with a good  book. Unless you do fun things for yourself every now and then, it's hard to have the energy for fun with your baby. Whatever your "special" treats are, make sure you find a way to continue to indulge in them after your baby is born. These special moments can recharge you, and allow you to return to baby with a new joy   PERINATAL MOOD DISORDERS: MATERNAL MENTAL HEALTH FROM CONCEPTION THROUGH THE POSTPARTUM PERIOD   _________________________________________Emergency and Crisis Resources If you are an imminent risk to self or others, are experiencing intense personal distress, and/or have noticed significant changes in activities of daily living, call:  911 [facility]: [phone]  [address] [place], [place], [address] Mobile Crisis: [phone] National Suicide Hotline: 988 Or visit the following crisis centers: Local Emergency Departments [facility]: [address] [place]  [phone]. Hours: 8:30AM-5PM. Insurance Accepted: Medicaid, Medicare, and Uninsured.  [facility]:  [address] [place]  Mon-Friday 8am-3pm, [phone]                                                                                  ___________ Non-Crisis Resources To identify specific providers that are covered by your insurance, contact your insurance company or local agencies:  [place]: [phone] [facility]: 6025939300 [place]: [phone] Postpartum Support International- Warm-line: 512-821-4288                                                      __Outpatient Therapy and Medication Management   Providers:  [facility]: [phone] Hours: 9AM-5PM  Insurance Accepted: Pilar Jarvis, Chase Caller, [place], Medicare  [facility] Care): [phone] Hours: 8AM-5:30PM  nsurance Accepted: All insurances EXCEPT AARP, [place],  [place], and [employer] of the [place]: [phone] Hours: 8AM-8PM Insurance Accepted: Ulla Gallo, Crista Luria, [place], Medicare, [phone] [facility]: [phone] Hours: 8:30AM-7PM Insurance Accepted: Ulla Gallo, Medicaid, Medicare, Tricare, [employer] [facility]:  [phone]   Hours:9AM-5PM Insurance Accepted:  Monia Pouch, Ezequiel Essex, [employer], [place], [employer] [facility]  [facility]: [phone] Hours: 9AM-5:30PM Insurance Accepted: Pilar Jarvis, Teodoro Spray, and Medicaid, Medicare, [facility]:  [phone] Hours: 9am-5pm Insurance Accepted: BCBS; they do not accept Medicaid/Medicare [facility]: [phone] Hours: 9am-9pm Insurance Accepted: All major insurance including Medicaid and Medicare [facility]: [phone] Hours: 9AM- 5PM Insurance Accepted: All insurances EXCEPT Medicaid and Medicare. [facility]: [phone]   ____________  Parenting Support Groups [facility]: [phone] [facility]:  [phone] Family Support Network: (support for children in the NICU and/or with special needs), [phone]   ___________                                                                 Mental Health Support Groups Mental Health Association: [phone]    _____________                                                                                  Online Resources Postpartum Support International: SeekAlumni.co.za  800-944-4PPD Supporting Moms:  www.momssupportingmoms.net

## 2023-01-28 ENCOUNTER — Encounter: Payer: Self-pay | Admitting: Family Medicine

## 2023-01-30 ENCOUNTER — Encounter: Payer: No Typology Code available for payment source | Admitting: Family Medicine

## 2023-01-31 ENCOUNTER — Encounter: Payer: No Typology Code available for payment source | Admitting: Obstetrics & Gynecology

## 2023-02-11 ENCOUNTER — Inpatient Hospital Stay (HOSPITAL_COMMUNITY)
Admission: AD | Admit: 2023-02-11 | Discharge: 2023-02-11 | Disposition: A | Discharge status: Home / Self Care | Payer: No Typology Code available for payment source | Source: Ambulatory Visit | Attending: Obstetrics and Gynecology | Admitting: Obstetrics and Gynecology

## 2023-02-11 ENCOUNTER — Other Ambulatory Visit: Payer: Self-pay

## 2023-02-11 ENCOUNTER — Encounter (HOSPITAL_COMMUNITY): Payer: Self-pay | Admitting: Obstetrics and Gynecology

## 2023-02-11 DIAGNOSIS — O10013 Pre-existing essential hypertension complicating pregnancy, third trimester: Secondary | ICD-10-CM | POA: Insufficient documentation

## 2023-02-11 DIAGNOSIS — I10 Essential (primary) hypertension: Secondary | ICD-10-CM

## 2023-02-11 DIAGNOSIS — O98813 Other maternal infectious and parasitic diseases complicating pregnancy, third trimester: Secondary | ICD-10-CM | POA: Diagnosis not present

## 2023-02-11 DIAGNOSIS — N898 Other specified noninflammatory disorders of vagina: Secondary | ICD-10-CM | POA: Diagnosis present

## 2023-02-11 DIAGNOSIS — O26893 Other specified pregnancy related conditions, third trimester: Secondary | ICD-10-CM

## 2023-02-11 DIAGNOSIS — B379 Candidiasis, unspecified: Secondary | ICD-10-CM | POA: Insufficient documentation

## 2023-02-11 DIAGNOSIS — Z3A32 32 weeks gestation of pregnancy: Secondary | ICD-10-CM | POA: Insufficient documentation

## 2023-02-11 DIAGNOSIS — Z3493 Encounter for supervision of normal pregnancy, unspecified, third trimester: Secondary | ICD-10-CM

## 2023-02-11 LAB — COMPREHENSIVE METABOLIC PANEL
ALT: 12 U/L (ref 0–44)
AST: 14 U/L — ABNORMAL LOW (ref 15–41)
Albumin: 2.6 g/dL — ABNORMAL LOW (ref 3.5–5.0)
Alkaline Phosphatase: 150 U/L — ABNORMAL HIGH (ref 38–126)
Anion gap: 9 (ref 5–15)
BUN: <5 mg/dL — ABNORMAL LOW (ref 6–20)
CO2: 17 mmol/L — ABNORMAL LOW (ref 22–32)
Calcium: 9.4 mg/dL (ref 8.9–10.3)
Chloride: 105 mmol/L (ref 98–111)
Creatinine, Ser: 0.37 mg/dL — ABNORMAL LOW (ref 0.44–1.00)
GFR, Estimated: >60 mL/min (ref >60)
Glucose, Bld: 198 mg/dL — ABNORMAL HIGH (ref 70–99)
Potassium: 3.4 mmol/L — ABNORMAL LOW (ref 3.5–5.1)
Sodium: 131 mmol/L — ABNORMAL LOW (ref 135–145)
Total Bilirubin: 0.4 mg/dL (ref <1.2)
Total Protein: 5.8 g/dL — ABNORMAL LOW (ref 6.5–8.1)

## 2023-02-11 LAB — URINALYSIS, ROUTINE W REFLEX MICROSCOPIC
Bilirubin Urine: NEGATIVE
Glucose, UA: >=500 mg/dL — AB
Ketones, ur: 5 mg/dL — AB
Leukocytes,Ua: NEGATIVE
Nitrite: NEGATIVE
Protein, ur: 30 mg/dL — AB
Specific Gravity, Urine: 1.016 (ref 1.005–1.030)
pH: 6 (ref 5.0–8.0)

## 2023-02-11 LAB — CBC
HCT: 33 % — ABNORMAL LOW (ref 36.0–46.0)
Hemoglobin: 10.4 g/dL — ABNORMAL LOW (ref 12.0–15.0)
MCH: 25.4 pg — ABNORMAL LOW (ref 26.0–34.0)
MCHC: 31.5 g/dL (ref 30.0–36.0)
MCV: 80.5 fL (ref 80.0–100.0)
Platelets: 337 10*3/uL (ref 150–400)
RBC: 4.1 MIL/uL (ref 3.87–5.11)
RDW: 14.7 % (ref 11.5–15.5)
WBC: 8.4 10*3/uL (ref 4.0–10.5)
nRBC: 1.1 % — ABNORMAL HIGH (ref 0.0–0.2)

## 2023-02-11 LAB — WET PREP, GENITAL
Sperm: NONE SEEN
Trich, Wet Prep: NONE SEEN
WBC, Wet Prep HPF POC: <10 (ref <10)

## 2023-02-11 LAB — PROTEIN / CREATININE RATIO, URINE
Creatinine, Urine: 41 mg/dL
Protein Creatinine Ratio: 1.34 mg/mg{creat} — ABNORMAL HIGH (ref 0.00–0.15)
Total Protein, Urine: 55 mg/dL

## 2023-02-11 LAB — POCT FERN TEST: POCT Fern Test: NEGATIVE

## 2023-02-11 MED ORDER — LABETALOL HCL 5 MG/ML IV SOLN: 80.0000 mg | INTRAVENOUS | Status: DC | PRN

## 2023-02-11 MED ORDER — TERCONAZOLE 0.4 % VA CREA
1.0000 | TOPICAL_CREAM | Freq: Every day | VAGINAL | 0 refills | Status: DC
Start: 2023-02-11 — End: 2023-02-20

## 2023-02-11 MED ORDER — HYDRALAZINE HCL 20 MG/ML IJ SOLN: 10.0000 mg | INTRAMUSCULAR | Status: DC | PRN

## 2023-02-11 MED ORDER — LABETALOL HCL 5 MG/ML IV SOLN: 40.0000 mg | INTRAVENOUS | Status: DC | PRN

## 2023-02-11 MED ORDER — LABETALOL HCL 5 MG/ML IV SOLN
20.0000 mg | INTRAVENOUS | Status: DC | PRN
  Administered 2023-02-11: 20 mg via INTRAVENOUS
  Filled 2023-02-11: qty 4

## 2023-02-11 NOTE — MAU Note (Signed)
.  Kimberly Stark is a 32 y.o. at [redacted]w[redacted]d here in MAU reporting: had a gush of fluid come out - "mostly clear with yellow pieces" - continued to have a trickle afterwards and now "feels moist". Reports dull pain in lower back around to left side that is constant. Denies VB. +FM. Last took labetalol at 1500. Denies HA, visual changes, or epigastric pain.   Onset of complaint: 1842 Pain score: 3 Vitals:   02/11/23 1933  BP: (!) 160/74  Pulse: 99  Resp: 19  Temp: 98.1 F (36.7 C)  SpO2: 100%     FHT:150 Lab orders placed from triage:  UA; fern

## 2023-02-11 NOTE — MAU Provider Note (Incomplete)
History     CSN: [id number]  Arrival date and time: 02/11/23 3474   Event Date/Time   First Provider Initiated Contact with Patient 02/11/23 2119      Chief Complaint  Patient presents with  . Vaginal Discharge   Kimberly Stark , a  32 y.o. [id number] at [redacted]w[redacted]d presents to MAU with complaints of leaking fluid and pelvic pressure. Patient reports that after getting out her car this evening she noted a large gush of liquid from her vagina. She states that the fluid was clear-ish with little specs of white. She states that afterwards she continued to feel a small trickle. Since arrival to MAU she denies continuing to leak but just noted that she feels "moist." She denies vagina bleeding, and abnormal vaginal discharge. She endorses positive fetal movement.  She is also a cHTN on Labetalol TID. She states that her mother recently passed and she was cleaning out her home today. Upon arrival to MAU she has had several elevated Bps. Endorses taking morning and afternoon dosing of labetalol but has not taken her nighttime dosing. She deneis headache, blurred vision, epigastric pain or increased swelling.          OB History     Gravida  12   Para  3   Term  2   Preterm  1   AB  8   Living  3      SAB  7   IAB  1   Ectopic  0   Multiple  0   Live Births  3        Obstetric Comments  Pre-E with both preg, induced with both         Past Medical History:  Diagnosis Date  . Anemia   . Asthma   . Bipolar 1 disorder (HCC)   . Blood transfusion without reported diagnosis   . Chiari malformation type I (HCC)   . Chronic post-traumatic stress disorder 09/19/2021  . Glaucoma 09/19/2021  . Heart murmur   . History of postpartum hemorrhage   . History of pulmonary embolism 2021   in setting of covid  . Insomnia 09/19/2021  . Migraine with typical aura 09/19/2021  . 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 Pregnancy induced hypertension   . Preterm labor   .  Sickle cell trait (HCC)   . UTI (urinary tract infection)   . Vaginal Pap smear, abnormal     Past Surgical History:  Procedure Laterality Date  . CERVICAL BIOPSY  W/ LOOP ELECTRODE EXCISION    . CERVICAL CERCLAGE    . CESAREAN SECTION    . CESAREAN SECTION N/A 12/30/2021   Procedure: CESAREAN SECTION;  Surgeon: Hermina Staggers, MD;  Location: [facility];  Service: Obstetrics;  Laterality: N/A;  . DILATION AND CURETTAGE OF UTERUS      Family History  Problem Relation Age of Onset  . Stroke Mother   . Heart disease Mother        hx open heart issues  . Hypertension Mother   . Diabetes Mother   . Cancer Father        started as colon, w/mets  . Kidney disease Father        renal failure  . Diabetes Father   . Cancer Maternal Grandmother   . Diabetes Paternal Grandmother     Social History   Tobacco Use  . Smoking status: Some Days    Current packs/day: 0.25  Types: Cigarettes  . Smokeless tobacco: Never  Vaping Use  . Vaping status: Former  . Substances: Nicotine  Substance Use Topics  . Alcohol use: Not Currently  . Drug use: Not Currently    Allergies:  Allergies  Allergen Reactions  . Iodinated Contrast Media Shortness Of Breath, Rash and Swelling  . Latex Anaphylaxis  . Shellfish Allergy Anaphylaxis  . Iodine Hives  . Penicillins Hives    Medications Prior to Admission  Medication Sig Dispense Refill Last Dose  . aspirin EC 81 MG tablet Take 2 tablets (162 mg total) by mouth daily. (Patient taking differently: Take 81 mg by mouth 2 (two) times daily.) 180 tablet 3 02/11/2023  . enoxaparin (LOVENOX) 40 MG/0.4ML injection Inject 0.4 mLs (40 mg total) into the skin daily. 12 mL 5 Past Week  . labetalol (NORMODYNE) 300 MG tablet Take 1 tablet (300 mg total) by mouth 3 (three) times daily. 270 tablet 3 02/11/2023  . promethazine (PHENERGAN) 25 MG tablet Take 1 tablet (25 mg total) by mouth every 6 (six) hours as needed for nausea or vomiting. 30 tablet 1  02/11/2023 at 1200  . Accu-Chek Softclix Lancets lancets To Check blood sugars 4 times a day. Fasting and 2 hours after the first bite of breakfast, lunch and dinner. 100 each 11   . acetaminophen (TYLENOL) 500 MG tablet Take 2 tablets (1,000 mg total) by mouth every 6 (six) hours. 30 tablet 0   . Adhesive Bandages (BAND-AID FLEXIBLE) MISC 1 Device by Does not apply route as needed. 100 each 11   . albuterol (VENTOLIN HFA) 108 (90 Base) MCG/ACT inhaler Inhale 1-2 puffs into the lungs every 6 (six) hours as needed for wheezing or shortness of breath.     . Alcohol Swabs 70 % PADS 1 Swab by Does not apply route as needed. 100 each 11   . glucose blood (ACCU-CHEK GUIDE) test strip To check blood sugars 4 times a day. Fasting, and 2 hours after Breakfast, Lunch and Dinner 100 each 12   . Ondansetron HCl (ZOFRAN PO) Take by mouth.     . pantoprazole (PROTONIX) 40 MG tablet Take 1 tablet (40 mg total) by mouth daily. 30 tablet 5   . Prenatal MV & Min w/FA-DHA (PRENATAL GUMMIES PO) Take 1 tablet by mouth in the morning.       Review of Systems  Genitourinary:  Positive for vaginal discharge.   Physical Exam   Blood pressure 139/70, pulse 89, temperature 98.1 F (36.7 C), temperature source Oral, resp. rate 19, height 5\' 1"  (1.549 m), weight 104.4 kg, SpO2 99%, currently breastfeeding.  Physical Exam Vitals and nursing note reviewed. Exam conducted with a chaperone present.  Constitutional:      General: She is not in acute distress.    Appearance: Normal appearance.  HENT:     Head: Normocephalic.  Pulmonary:     Effort: Pulmonary effort is normal.  Genitourinary:    Comments: Copious amounts of chunky white vaginal discharge noted adhered to vaginal walls and cervix. Negative for pooling.   Cervix visually closed.  Musculoskeletal:     Cervical back: Normal range of motion.  Skin:    General: Skin is warm and dry.  Neurological:     Mental Status: She is alert and oriented to person,  place, and time.  Psychiatric:        Mood and Affect: Mood normal.    FHT: 135 bpm with moderate variability 10x10 accels present no decels  Toco: irregular contractions. Patient unaware   MAU Course  Procedures Orders Placed This Encounter  Procedures  . Wet prep, genital  . Culture, OB Urine  . Urinalysis, Routine w reflex microscopic -Urine, Clean Catch  . Comprehensive metabolic panel  . CBC  . Protein / creatinine ratio, urine  . Notify physician (specify) Confirmatory reading of BP> 160/110 15 minutes later  . Apply Hypertensive Disorders of Pregnancy Care Plan  . Measure blood pressure  . Fern Test  . Insert peripheral IV  . Discharge patient   Results for orders placed or performed during the hospital encounter of 02/11/23 (from the past 24 hour(s))  Urinalysis, Routine w reflex microscopic -Urine, Clean Catch     Status: Abnormal   Collection Time: 02/11/23  7:55 PM  Result Value Ref Range   Color, Urine YELLOW YELLOW   APPearance HAZY (A) CLEAR   Specific Gravity, Urine 1.016 1.005 - 1.030   pH 6.0 5.0 - 8.0   Glucose, UA >=500 (A) NEGATIVE mg/dL   Hgb urine dipstick SMALL (A) NEGATIVE   Bilirubin Urine NEGATIVE NEGATIVE   Ketones, ur 5 (A) NEGATIVE mg/dL   Protein, ur 30 (A) NEGATIVE mg/dL   Nitrite NEGATIVE NEGATIVE   Leukocytes,Ua NEGATIVE NEGATIVE   RBC / HPF 6-10 0 - 5 RBC/hpf   WBC, UA 6-10 0 - 5 WBC/hpf   Bacteria, UA RARE (A) NONE SEEN   Squamous Epithelial / HPF 0-5 0 - 5 /HPF   Budding Yeast PRESENT    Hyphae Yeast PRESENT   Protein / creatinine ratio, urine     Status: Abnormal   Collection Time: 02/11/23  7:55 PM  Result Value Ref Range   Creatinine, Urine 41 mg/dL   Total Protein, Urine 55 mg/dL   Protein Creatinine Ratio 1.34 (H) 0.00 - 0.15 mg/mg[Cre]  Comprehensive metabolic panel     Status: Abnormal   Collection Time: 02/11/23  8:08 PM  Result Value Ref Range   Sodium 131 (L) 135 - 145 mmol/L   Potassium 3.4 (L) 3.5 - 5.1 mmol/L    Chloride 105 98 - 111 mmol/L   CO2 17 (L) 22 - 32 mmol/L   Glucose, Bld 198 (H) 70 - 99 mg/dL   BUN <5 (L) 6 - 20 mg/dL   Creatinine, Ser 1.61 (L) 0.44 - 1.00 mg/dL   Calcium 9.4 8.9 - 09.6 mg/dL   Total Protein 5.8 (L) 6.5 - 8.1 g/dL   Albumin 2.6 (L) 3.5 - 5.0 g/dL   AST 14 (L) 15 - 41 U/L   ALT 12 0 - 44 U/L   Alkaline Phosphatase 150 (H) 38 - 126 U/L   Total Bilirubin 0.4 <1.2 mg/dL   GFR, Estimated >04 >54 mL/min   Anion gap 9 5 - 15  CBC     Status: Abnormal   Collection Time: 02/11/23  8:08 PM  Result Value Ref Range   WBC 8.4 4.0 - 10.5 K/uL   RBC 4.10 3.87 - 5.11 MIL/uL   Hemoglobin 10.4 (L) 12.0 - 15.0 g/dL   HCT 09.8 (L) 11.9 - 14.7 %   MCV 80.5 80.0 - 100.0 fL   MCH 25.4 (L) 26.0 - 34.0 pg   MCHC 31.5 30.0 - 36.0 g/dL   RDW 82.9 56.2 - 13.0 %   Platelets 337 150 - 400 K/uL   nRBC 1.1 (H) 0.0 - 0.2 %  Fern Test     Status: Normal   Collection Time: 02/11/23  8:20 PM  Result Value Ref Range   POCT Fern Test Negative = intact amniotic membranes   Wet prep, genital     Status: Abnormal   Collection Time: 02/11/23  9:31 PM  Result Value Ref Range   Yeast Wet Prep HPF POC PRESENT (A) NONE SEEN   Trich, Wet Prep NONE SEEN NONE SEEN   Clue Cells Wet Prep HPF POC PRESENT (A) NONE SEEN   WBC, Wet Prep HPF POC <10 <10   Sperm NONE SEEN    Patient Vitals for the past 24 hrs:  BP Temp Temp src Pulse Resp SpO2 Height Weight  02/11/23 2246 (!) 156/68 -- -- 91 -- -- -- --  02/11/23 2245 -- -- -- -- -- 100 % -- --  02/11/23 2240 -- -- -- -- -- 99 % -- --  02/11/23 2235 -- -- -- -- -- 100 % -- --  02/11/23 2230 (!) 157/62 -- -- 86 -- 100 % -- --  02/11/23 2216 (!) 143/62 -- -- 81 -- -- -- --  02/11/23 2215 -- -- -- -- -- 100 % -- --  02/11/23 2210 -- -- -- -- -- 100 % -- --  02/11/23 2205 -- -- -- -- -- 100 % -- --  02/11/23 2201 (!) 146/60 -- -- 89 -- -- -- --  02/11/23 2200 -- -- -- -- -- 100 % -- --  02/11/23 2145 (!) 156/73 -- -- 86 -- 99 % -- --  02/11/23 2131 (!)  158/87 -- -- 97 -- -- -- --  02/11/23 2116 139/70 -- -- 89 -- -- -- --  02/11/23 2100 136/67 -- -- 90 -- 99 % -- --  02/11/23 2055 -- -- -- -- -- 98 % -- --  02/11/23 2050 -- -- -- -- -- 98 % -- --  02/11/23 2046 (!) 143/71 -- -- 93 -- -- -- --  02/11/23 2045 -- -- -- -- -- 99 % -- --  02/11/23 2040 -- -- -- -- -- 99 % -- --  02/11/23 2035 -- -- -- -- -- 98 % -- --  02/11/23 2030 (!) 154/71 -- -- 95 -- 99 % -- --  02/11/23 2025 -- -- -- -- -- 99 % -- --  02/11/23 2020 -- -- -- -- -- 99 % -- --  02/11/23 2015 (!) 150/74 -- -- 89 -- 99 % -- --  02/11/23 2002 (!) 167/86 -- -- 98 -- 99 % -- --  02/11/23 1950 (!) 162/74 -- -- 99 -- 100 % -- --  02/11/23 1933 (!) 160/74 98.1 F (36.7 C) Oral 99 19 100 % 5\' 1"  (1.549 m) 104.4 kg    MDM - Night time dose of labetalol given - Pre E labs normal except PCR elevated at 1.34. No other signs and symptoms of PreE.  - After nighttime labetalol given, BPs remained elvated bu  Assessment and Plan  ***  Claudette Head 02/11/2023, 9:19 PM

## 2023-02-11 NOTE — MAU Provider Note (Signed)
History     CSN: [id number]  Arrival date and time: 02/11/23 8657   Event Date/Time   First Provider Initiated Contact with Patient 02/11/23 2119      Chief Complaint  Patient presents with   Vaginal Discharge   Kimberly Stark , a  32 y.o. [id number] at [redacted]w[redacted]d presents to MAU with complaints of leaking fluid and pelvic pressure. Patient reports that after getting out her car this evening she noted a large gush of liquid from her vagina. She states that the fluid was clear-ish with little specs of white. She states that afterwards she continued to feel a small trickle. Since arrival to MAU she denies continuing to leak but just noted that she feels "moist." She denies vagina bleeding, and abnormal vaginal discharge. She endorses positive fetal movement.  She is also a cHTN on Labetalol TID. She states that her mother recently passed and she was cleaning out her home today. Upon arrival to MAU she has had several elevated Bps. Endorses taking morning and afternoon dosing of labetalol but has not taken her nighttime dosing. She deneis headache, blurred vision, epigastric pain or increased swelling.          OB History     Gravida  12   Para  3   Term  2   Preterm  1   AB  8   Living  3      SAB  7   IAB  1   Ectopic  0   Multiple  0   Live Births  3        Obstetric Comments  Pre-E with both preg, induced with both         Past Medical History:  Diagnosis Date   Anemia    Asthma    Bipolar 1 disorder (HCC)    Blood transfusion without reported diagnosis    Chiari malformation type I (HCC)    Chronic post-traumatic stress disorder 09/19/2021   Glaucoma 09/19/2021   Heart murmur    History of postpartum hemorrhage    History of pulmonary embolism 2021   in setting of covid   Insomnia 09/19/2021   Migraine with typical aura 09/19/2021   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/2021   Pregnancy induced hypertension    Preterm labor    Sickle cell trait  (HCC)    UTI (urinary tract infection)    Vaginal Pap smear, abnormal     Past Surgical History:  Procedure Laterality Date   CERVICAL BIOPSY  W/ LOOP ELECTRODE EXCISION     CERVICAL CERCLAGE     CESAREAN SECTION     CESAREAN SECTION N/A 12/30/2021   Procedure: CESAREAN SECTION;  Surgeon: Hermina Staggers, MD;  Location: [facility];  Service: Obstetrics;  Laterality: N/A;   DILATION AND CURETTAGE OF UTERUS      Family History  Problem Relation Age of Onset   Stroke Mother    Heart disease Mother        hx open heart issues   Hypertension Mother    Diabetes Mother    Cancer Father        started as colon, w/mets   Kidney disease Father        renal failure   Diabetes Father    Cancer Maternal Grandmother    Diabetes Paternal Grandmother     Social History   Tobacco Use   Smoking status: Some Days    Current packs/day: 0.25  Types: Cigarettes   Smokeless tobacco: Never  Vaping Use   Vaping status: Former   Substances: Nicotine  Substance Use Topics   Alcohol use: Not Currently   Drug use: Not Currently    Allergies:  Allergies  Allergen Reactions   Iodinated Contrast Media Shortness Of Breath, Rash and Swelling   Latex Anaphylaxis   Shellfish Allergy Anaphylaxis   Iodine Hives   Penicillins Hives    No medications prior to admission.    Review of Systems  Genitourinary:  Positive for vaginal discharge.   Physical Exam   Blood pressure (!) 156/68, pulse 91, temperature 98.1 F (36.7 C), temperature source Oral, resp. rate 19, height 5\' 1"  (1.549 m), weight 104.4 kg, SpO2 100%, currently breastfeeding.  Physical Exam Vitals and nursing note reviewed. Exam conducted with a chaperone present.  Constitutional:      General: She is not in acute distress.    Appearance: Normal appearance.  HENT:     Head: Normocephalic.  Pulmonary:     Effort: Pulmonary effort is normal.  Genitourinary:    Comments: Copious amounts of chunky white vaginal discharge  noted adhered to vaginal walls and cervix. Negative for pooling.   Cervix visually closed.  Musculoskeletal:     Cervical back: Normal range of motion.  Skin:    General: Skin is warm and dry.  Neurological:     Mental Status: She is alert and oriented to person, place, and time.  Psychiatric:        Mood and Affect: Mood normal.    FHT: 135 bpm with moderate variability 10x10 accels present no decels  Toco: irregular contractions. Patient unaware   MAU Course  Procedures Orders Placed This Encounter  Procedures   Wet prep, genital   Culture, OB Urine   Urinalysis, Routine w reflex microscopic -Urine, Clean Catch   Comprehensive metabolic panel   CBC   Protein / creatinine ratio, urine   Notify physician (specify) Confirmatory reading of BP> 160/110 15 minutes later   Apply Hypertensive Disorders of Pregnancy Care Plan   Measure blood pressure   Fern Test   Insert peripheral IV   Discharge patient   Results for orders placed or performed during the hospital encounter of 02/11/23 (from the past 24 hour(s))  Urinalysis, Routine w reflex microscopic -Urine, Clean Catch     Status: Abnormal   Collection Time: 02/11/23  7:55 PM  Result Value Ref Range   Color, Urine YELLOW YELLOW   APPearance HAZY (A) CLEAR   Specific Gravity, Urine 1.016 1.005 - 1.030   pH 6.0 5.0 - 8.0   Glucose, UA >=500 (A) NEGATIVE mg/dL   Hgb urine dipstick SMALL (A) NEGATIVE   Bilirubin Urine NEGATIVE NEGATIVE   Ketones, ur 5 (A) NEGATIVE mg/dL   Protein, ur 30 (A) NEGATIVE mg/dL   Nitrite NEGATIVE NEGATIVE   Leukocytes,Ua NEGATIVE NEGATIVE   RBC / HPF 6-10 0 - 5 RBC/hpf   WBC, UA 6-10 0 - 5 WBC/hpf   Bacteria, UA RARE (A) NONE SEEN   Squamous Epithelial / HPF 0-5 0 - 5 /HPF   Budding Yeast PRESENT    Hyphae Yeast PRESENT   Protein / creatinine ratio, urine     Status: Abnormal   Collection Time: 02/11/23  7:55 PM  Result Value Ref Range   Creatinine, Urine 41 mg/dL   Total Protein, Urine  55 mg/dL   Protein Creatinine Ratio 1.34 (H) 0.00 - 0.15 mg/mg[Cre]  Comprehensive metabolic panel  Status: Abnormal   Collection Time: 02/11/23  8:08 PM  Result Value Ref Range   Sodium 131 (L) 135 - 145 mmol/L   Potassium 3.4 (L) 3.5 - 5.1 mmol/L   Chloride 105 98 - 111 mmol/L   CO2 17 (L) 22 - 32 mmol/L   Glucose, Bld 198 (H) 70 - 99 mg/dL   BUN <5 (L) 6 - 20 mg/dL   Creatinine, Ser 9.60 (L) 0.44 - 1.00 mg/dL   Calcium 9.4 8.9 - 45.4 mg/dL   Total Protein 5.8 (L) 6.5 - 8.1 g/dL   Albumin 2.6 (L) 3.5 - 5.0 g/dL   AST 14 (L) 15 - 41 U/L   ALT 12 0 - 44 U/L   Alkaline Phosphatase 150 (H) 38 - 126 U/L   Total Bilirubin 0.4 <1.2 mg/dL   GFR, Estimated >09 >81 mL/min   Anion gap 9 5 - 15  CBC     Status: Abnormal   Collection Time: 02/11/23  8:08 PM  Result Value Ref Range   WBC 8.4 4.0 - 10.5 K/uL   RBC 4.10 3.87 - 5.11 MIL/uL   Hemoglobin 10.4 (L) 12.0 - 15.0 g/dL   HCT 19.1 (L) 47.8 - 29.5 %   MCV 80.5 80.0 - 100.0 fL   MCH 25.4 (L) 26.0 - 34.0 pg   MCHC 31.5 30.0 - 36.0 g/dL   RDW 62.1 30.8 - 65.7 %   Platelets 337 150 - 400 K/uL   nRBC 1.1 (H) 0.0 - 0.2 %  Fern Test     Status: Normal   Collection Time: 02/11/23  8:20 PM  Result Value Ref Range   POCT Fern Test Negative = intact amniotic membranes   Wet prep, genital     Status: Abnormal   Collection Time: 02/11/23  9:31 PM  Result Value Ref Range   Yeast Wet Prep HPF POC PRESENT (A) NONE SEEN   Trich, Wet Prep NONE SEEN NONE SEEN   Clue Cells Wet Prep HPF POC PRESENT (A) NONE SEEN   WBC, Wet Prep HPF POC <10 <10   Sperm NONE SEEN    Patient Vitals for the past 24 hrs:  BP Temp Temp src Pulse Resp SpO2 Height Weight  02/11/23 2246 (!) 156/68 -- -- 91 -- -- -- --  02/11/23 2245 -- -- -- -- -- 100 % -- --  02/11/23 2240 -- -- -- -- -- 99 % -- --  02/11/23 2235 -- -- -- -- -- 100 % -- --  02/11/23 2230 (!) 157/62 -- -- 86 -- 100 % -- --  02/11/23 2216 (!) 143/62 -- -- 81 -- -- -- --  02/11/23 2215 -- -- -- --  -- 100 % -- --  02/11/23 2210 -- -- -- -- -- 100 % -- --  02/11/23 2205 -- -- -- -- -- 100 % -- --  02/11/23 2201 (!) 146/60 -- -- 89 -- -- -- --  02/11/23 2200 -- -- -- -- -- 100 % -- --  02/11/23 2145 (!) 156/73 -- -- 86 -- 99 % -- --  02/11/23 2131 (!) 158/87 -- -- 97 -- -- -- --  02/11/23 2116 139/70 -- -- 89 -- -- -- --  02/11/23 2100 136/67 -- -- 90 -- 99 % -- --  02/11/23 2055 -- -- -- -- -- 98 % -- --  02/11/23 2050 -- -- -- -- -- 98 % -- --  02/11/23 2046 (!) 143/71 -- -- 93 -- -- -- --  02/11/23 2045 -- -- -- -- -- 99 % -- --  02/11/23 2040 -- -- -- -- -- 99 % -- --  02/11/23 2035 -- -- -- -- -- 98 % -- --  02/11/23 2030 (!) 154/71 -- -- 95 -- 99 % -- --  02/11/23 2025 -- -- -- -- -- 99 % -- --  02/11/23 2020 -- -- -- -- -- 99 % -- --  02/11/23 2015 (!) 150/74 -- -- 89 -- 99 % -- --  02/11/23 2002 (!) 167/86 -- -- 98 -- 99 % -- --  02/11/23 1950 (!) 162/74 -- -- 99 -- 100 % -- --  02/11/23 1933 (!) 160/74 98.1 F (36.7 C) Oral 99 19 100 % 5\' 1"  (1.549 m) 104.4 kg    MDM - Night time dose of labetalol given - Pre E labs normal except PCR elevated at 1.34. No other signs and symptoms of PreE.  - After nighttime labetalol given, BPs remained elevated but no longer severe range.  - Membranes intact.  - Wet prep and Urine positive for Yeast.  - plan for discharge.   Assessment and Plan   1. Intact amniotic membranes during pregnancy in third trimester   2. [redacted] weeks gestation of pregnancy   3. Yeast infection   4. Chronic hypertension    - Reviewed signs and symptoms of labor and when to return to MAU.  - Recommended Terazole 7 nightly. Patient verbalized understanding and medication sent to outpatient.   - Reviewed worsnening signs of hypertension. Patient has a [facility] appt for Tuesday, recommended to keep appt. Discussed the option of increasing Labetalol dosing.  - Patient discharged home in stable condition and may return to MAU as needed.   Claudette Head, MSN CNM   02/12/2023, 12:05 AM

## 2023-02-13 ENCOUNTER — Other Ambulatory Visit: Payer: Self-pay

## 2023-02-13 ENCOUNTER — Encounter: Payer: Self-pay | Admitting: *Deleted

## 2023-02-13 ENCOUNTER — Ambulatory Visit: Payer: No Typology Code available for payment source | Admitting: *Deleted

## 2023-02-13 ENCOUNTER — Ambulatory Visit: Payer: No Typology Code available for payment source | Attending: Obstetrics and Gynecology

## 2023-02-13 DIAGNOSIS — O403XX0 Polyhydramnios, third trimester, not applicable or unspecified: Secondary | ICD-10-CM | POA: Diagnosis not present

## 2023-02-13 DIAGNOSIS — O10013 Pre-existing essential hypertension complicating pregnancy, third trimester: Secondary | ICD-10-CM | POA: Diagnosis not present

## 2023-02-13 DIAGNOSIS — Z3A32 32 weeks gestation of pregnancy: Secondary | ICD-10-CM

## 2023-02-13 DIAGNOSIS — O09899 Supervision of other high risk pregnancies, unspecified trimester: Secondary | ICD-10-CM | POA: Diagnosis present

## 2023-02-13 DIAGNOSIS — O09293 Supervision of pregnancy with other poor reproductive or obstetric history, third trimester: Secondary | ICD-10-CM | POA: Insufficient documentation

## 2023-02-13 DIAGNOSIS — O09213 Supervision of pregnancy with history of pre-term labor, third trimester: Secondary | ICD-10-CM

## 2023-02-13 DIAGNOSIS — O24410 Gestational diabetes mellitus in pregnancy, diet controlled: Secondary | ICD-10-CM | POA: Diagnosis not present

## 2023-02-13 DIAGNOSIS — Z141 Cystic fibrosis carrier: Secondary | ICD-10-CM

## 2023-02-13 DIAGNOSIS — O99333 Smoking (tobacco) complicating pregnancy, third trimester: Secondary | ICD-10-CM

## 2023-02-13 DIAGNOSIS — O24419 Gestational diabetes mellitus in pregnancy, unspecified control: Secondary | ICD-10-CM | POA: Diagnosis present

## 2023-02-13 DIAGNOSIS — O2623 Pregnancy care for patient with recurrent pregnancy loss, third trimester: Secondary | ICD-10-CM

## 2023-02-13 DIAGNOSIS — O99213 Obesity complicating pregnancy, third trimester: Secondary | ICD-10-CM | POA: Insufficient documentation

## 2023-02-13 DIAGNOSIS — O10913 Unspecified pre-existing hypertension complicating pregnancy, third trimester: Secondary | ICD-10-CM | POA: Insufficient documentation

## 2023-02-13 DIAGNOSIS — E669 Obesity, unspecified: Secondary | ICD-10-CM

## 2023-02-13 DIAGNOSIS — O285 Abnormal chromosomal and genetic finding on antenatal screening of mother: Secondary | ICD-10-CM

## 2023-02-13 LAB — CULTURE, OB URINE: Culture: 40000 — AB

## 2023-02-14 ENCOUNTER — Encounter (HOSPITAL_COMMUNITY): Payer: Self-pay | Admitting: Obstetrics and Gynecology

## 2023-02-14 ENCOUNTER — Ambulatory Visit: Payer: No Typology Code available for payment source | Admitting: Obstetrics & Gynecology

## 2023-02-14 ENCOUNTER — Inpatient Hospital Stay (HOSPITAL_COMMUNITY)
Admission: AD | Admit: 2023-02-14 | Discharge: 2023-02-20 | DRG: 785 | Disposition: A | Discharge status: Home / Self Care | Payer: No Typology Code available for payment source | Attending: Obstetrics and Gynecology | Admitting: Obstetrics and Gynecology

## 2023-02-14 VITALS — BP 140/83 | HR 99 | Wt 225.7 lb

## 2023-02-14 DIAGNOSIS — Z86711 Personal history of pulmonary embolism: Secondary | ICD-10-CM

## 2023-02-14 DIAGNOSIS — Q0700 Arnold-Chiari syndrome without spina bifida or hydrocephalus: Secondary | ICD-10-CM

## 2023-02-14 DIAGNOSIS — O24420 Gestational diabetes mellitus in childbirth, diet controlled: Secondary | ICD-10-CM | POA: Diagnosis not present

## 2023-02-14 DIAGNOSIS — O99334 Smoking (tobacco) complicating childbirth: Secondary | ICD-10-CM | POA: Diagnosis present

## 2023-02-14 DIAGNOSIS — Z141 Cystic fibrosis carrier: Secondary | ICD-10-CM

## 2023-02-14 DIAGNOSIS — I10 Essential (primary) hypertension: Secondary | ICD-10-CM | POA: Diagnosis present

## 2023-02-14 DIAGNOSIS — O119 Pre-existing hypertension with pre-eclampsia, unspecified trimester: Secondary | ICD-10-CM | POA: Diagnosis present

## 2023-02-14 DIAGNOSIS — O24425 Gestational diabetes mellitus in childbirth, controlled by oral hypoglycemic drugs: Secondary | ICD-10-CM | POA: Diagnosis present

## 2023-02-14 DIAGNOSIS — O09893 Supervision of other high risk pregnancies, third trimester: Secondary | ICD-10-CM

## 2023-02-14 DIAGNOSIS — O169 Unspecified maternal hypertension, unspecified trimester: Secondary | ICD-10-CM | POA: Insufficient documentation

## 2023-02-14 DIAGNOSIS — O9089 Other complications of the puerperium, not elsewhere classified: Secondary | ICD-10-CM

## 2023-02-14 DIAGNOSIS — Z841 Family history of disorders of kidney and ureter: Secondary | ICD-10-CM

## 2023-02-14 DIAGNOSIS — O114 Pre-existing hypertension with pre-eclampsia, complicating childbirth: Secondary | ICD-10-CM | POA: Diagnosis present

## 2023-02-14 DIAGNOSIS — D573 Sickle-cell trait: Secondary | ICD-10-CM | POA: Diagnosis present

## 2023-02-14 DIAGNOSIS — Z7982 Long term (current) use of aspirin: Secondary | ICD-10-CM | POA: Diagnosis not present

## 2023-02-14 DIAGNOSIS — Z8632 Personal history of gestational diabetes: Secondary | ICD-10-CM | POA: Diagnosis present

## 2023-02-14 DIAGNOSIS — O1092 Unspecified pre-existing hypertension complicating childbirth: Secondary | ICD-10-CM | POA: Diagnosis present

## 2023-02-14 DIAGNOSIS — Z823 Family history of stroke: Secondary | ICD-10-CM

## 2023-02-14 DIAGNOSIS — Z880 Allergy status to penicillin: Secondary | ICD-10-CM | POA: Diagnosis not present

## 2023-02-14 DIAGNOSIS — F17210 Nicotine dependence, cigarettes, uncomplicated: Secondary | ICD-10-CM | POA: Diagnosis present

## 2023-02-14 DIAGNOSIS — O9902 Anemia complicating childbirth: Secondary | ICD-10-CM | POA: Diagnosis present

## 2023-02-14 DIAGNOSIS — O113 Pre-existing hypertension with pre-eclampsia, third trimester: Secondary | ICD-10-CM | POA: Diagnosis not present

## 2023-02-14 DIAGNOSIS — O99892 Other specified diseases and conditions complicating childbirth: Secondary | ICD-10-CM | POA: Diagnosis present

## 2023-02-14 DIAGNOSIS — Z91041 Radiographic dye allergy status: Secondary | ICD-10-CM

## 2023-02-14 DIAGNOSIS — Z9851 Tubal ligation status: Secondary | ICD-10-CM

## 2023-02-14 DIAGNOSIS — O24419 Gestational diabetes mellitus in pregnancy, unspecified control: Secondary | ICD-10-CM | POA: Diagnosis present

## 2023-02-14 DIAGNOSIS — Z8249 Family history of ischemic heart disease and other diseases of the circulatory system: Secondary | ICD-10-CM

## 2023-02-14 DIAGNOSIS — Z79899 Other long term (current) drug therapy: Secondary | ICD-10-CM | POA: Diagnosis not present

## 2023-02-14 DIAGNOSIS — Z98891 History of uterine scar from previous surgery: Principal | ICD-10-CM

## 2023-02-14 DIAGNOSIS — O09293 Supervision of pregnancy with other poor reproductive or obstetric history, third trimester: Secondary | ICD-10-CM

## 2023-02-14 DIAGNOSIS — O34211 Maternal care for low transverse scar from previous cesarean delivery: Secondary | ICD-10-CM | POA: Diagnosis present

## 2023-02-14 DIAGNOSIS — O10913 Unspecified pre-existing hypertension complicating pregnancy, third trimester: Secondary | ICD-10-CM

## 2023-02-14 DIAGNOSIS — O24410 Gestational diabetes mellitus in pregnancy, diet controlled: Secondary | ICD-10-CM

## 2023-02-14 DIAGNOSIS — O10919 Unspecified pre-existing hypertension complicating pregnancy, unspecified trimester: Secondary | ICD-10-CM

## 2023-02-14 DIAGNOSIS — Z3A33 33 weeks gestation of pregnancy: Secondary | ICD-10-CM

## 2023-02-14 DIAGNOSIS — Z91040 Latex allergy status: Secondary | ICD-10-CM | POA: Diagnosis not present

## 2023-02-14 DIAGNOSIS — O24414 Gestational diabetes mellitus in pregnancy, insulin controlled: Secondary | ICD-10-CM | POA: Diagnosis not present

## 2023-02-14 DIAGNOSIS — Z23 Encounter for immunization: Secondary | ICD-10-CM

## 2023-02-14 DIAGNOSIS — Z91013 Allergy to seafood: Secondary | ICD-10-CM

## 2023-02-14 DIAGNOSIS — Z3009 Encounter for other general counseling and advice on contraception: Secondary | ICD-10-CM | POA: Diagnosis present

## 2023-02-14 DIAGNOSIS — Z833 Family history of diabetes mellitus: Secondary | ICD-10-CM | POA: Diagnosis not present

## 2023-02-14 DIAGNOSIS — Z302 Encounter for sterilization: Secondary | ICD-10-CM | POA: Diagnosis not present

## 2023-02-14 DIAGNOSIS — O0990 Supervision of high risk pregnancy, unspecified, unspecified trimester: Secondary | ICD-10-CM

## 2023-02-14 DIAGNOSIS — R519 Headache, unspecified: Secondary | ICD-10-CM

## 2023-02-14 DIAGNOSIS — O1414 Severe pre-eclampsia complicating childbirth: Secondary | ICD-10-CM | POA: Diagnosis not present

## 2023-02-14 DIAGNOSIS — O99344 Other mental disorders complicating childbirth: Secondary | ICD-10-CM | POA: Diagnosis not present

## 2023-02-14 LAB — URINALYSIS, ROUTINE W REFLEX MICROSCOPIC
Bilirubin Urine: NEGATIVE
Glucose, UA: >=500 mg/dL — AB
Hgb urine dipstick: NEGATIVE
Ketones, ur: 5 mg/dL — AB
Leukocytes,Ua: NEGATIVE
Nitrite: NEGATIVE
Protein, ur: 100 mg/dL — AB
Specific Gravity, Urine: 1.02 (ref 1.005–1.030)
pH: 6 (ref 5.0–8.0)

## 2023-02-14 LAB — COMPREHENSIVE METABOLIC PANEL
ALT: 10 U/L (ref 0–44)
AST: 19 U/L (ref 15–41)
Albumin: 2.5 g/dL — ABNORMAL LOW (ref 3.5–5.0)
Alkaline Phosphatase: 137 U/L — ABNORMAL HIGH (ref 38–126)
Anion gap: 10 (ref 5–15)
BUN: <5 mg/dL — ABNORMAL LOW (ref 6–20)
CO2: 17 mmol/L — ABNORMAL LOW (ref 22–32)
Calcium: 9.3 mg/dL (ref 8.9–10.3)
Chloride: 105 mmol/L (ref 98–111)
Creatinine, Ser: 0.38 mg/dL — ABNORMAL LOW (ref 0.44–1.00)
GFR, Estimated: >60 mL/min (ref >60)
Glucose, Bld: 161 mg/dL — ABNORMAL HIGH (ref 70–99)
Potassium: 3.6 mmol/L (ref 3.5–5.1)
Sodium: 132 mmol/L — ABNORMAL LOW (ref 135–145)
Total Bilirubin: 0.6 mg/dL (ref <1.2)
Total Protein: 5.8 g/dL — ABNORMAL LOW (ref 6.5–8.1)

## 2023-02-14 LAB — CBC
HCT: 32.8 % — ABNORMAL LOW (ref 36.0–46.0)
Hemoglobin: 10 g/dL — ABNORMAL LOW (ref 12.0–15.0)
MCH: 24.8 pg — ABNORMAL LOW (ref 26.0–34.0)
MCHC: 30.5 g/dL (ref 30.0–36.0)
MCV: 81.2 fL (ref 80.0–100.0)
Platelets: 364 10*3/uL (ref 150–400)
RBC: 4.04 MIL/uL (ref 3.87–5.11)
RDW: 14.8 % (ref 11.5–15.5)
WBC: 7.6 10*3/uL (ref 4.0–10.5)
nRBC: 2.2 % — ABNORMAL HIGH (ref 0.0–0.2)

## 2023-02-14 LAB — GLUCOSE, CAPILLARY
Glucose-Capillary: 253 mg/dL — ABNORMAL HIGH (ref 70–99)
Glucose-Capillary: 290 mg/dL — ABNORMAL HIGH (ref 70–99)
Glucose-Capillary: 336 mg/dL — ABNORMAL HIGH (ref 70–99)
Glucose-Capillary: 362 mg/dL — ABNORMAL HIGH (ref 70–99)
Glucose-Capillary: 315 mg/dL — ABNORMAL HIGH (ref 70–99)
Glucose-Capillary: 315 mg/dL — ABNORMAL HIGH (ref 70–99)

## 2023-02-14 MED ORDER — LABETALOL HCL 200 MG PO TABS
600.0000 mg | ORAL_TABLET | Freq: Three times a day (TID) | ORAL | Status: DC
  Administered 2023-02-14 – 2023-02-16 (×5): 600 mg via ORAL
  Filled 2023-02-14 (×5): qty 3

## 2023-02-14 MED ORDER — PRENATAL MULTIVITAMIN CH
1.0000 | ORAL_TABLET | Freq: Every day | ORAL | Status: DC
  Administered 2023-02-14 – 2023-02-15 (×2): 1 via ORAL
  Filled 2023-02-14 (×2): qty 1

## 2023-02-14 MED ORDER — AMLODIPINE BESYLATE 5 MG PO TABS
5.0000 mg | ORAL_TABLET | Freq: Every day | ORAL | Status: DC
  Administered 2023-02-14 – 2023-02-16 (×3): 5 mg via ORAL
  Filled 2023-02-14 (×3): qty 1

## 2023-02-14 MED ORDER — INSULIN REGULAR(HUMAN) IN NACL 100-0.9 UT/100ML-% IV SOLN
INTRAVENOUS | Status: DC
  Administered 2023-02-14: 8.5 [IU]/h via INTRAVENOUS
  Administered 2023-02-15: 11.5 [IU]/h via INTRAVENOUS
  Filled 2023-02-14 (×2): qty 100

## 2023-02-14 MED ORDER — ACETAMINOPHEN-CAFFEINE 500-65 MG PO TABS
2.0000 | ORAL_TABLET | Freq: Four times a day (QID) | ORAL | Status: DC | PRN
  Administered 2023-02-14 – 2023-02-16 (×4): 2 via ORAL
  Filled 2023-02-14 (×5): qty 2

## 2023-02-14 MED ORDER — LABETALOL HCL 300 MG PO TABS: 600.0000 mg | ORAL_TABLET | Freq: Three times a day (TID) | ORAL | 2 refills | Status: DC

## 2023-02-14 MED ORDER — SODIUM CHLORIDE 0.9% FLUSH
10.0000 mL | Freq: Two times a day (BID) | INTRAVENOUS | Status: DC
Start: 2023-02-14 — End: 2023-02-16
  Administered 2023-02-14 – 2023-02-15 (×2): 10 mL via INTRAVENOUS

## 2023-02-14 MED ORDER — ACETAMINOPHEN 325 MG PO TABS: 650.0000 mg | ORAL_TABLET | ORAL | Status: DC | PRN

## 2023-02-14 MED ORDER — CALCIUM CARBONATE ANTACID 500 MG PO CHEW
2.0000 | CHEWABLE_TABLET | ORAL | Status: DC | PRN
  Administered 2023-02-15 – 2023-02-16 (×4): 400 mg via ORAL
  Filled 2023-02-14 (×5): qty 2

## 2023-02-14 MED ORDER — CLOTRIMAZOLE 1 % VA CREA
1.0000 | TOPICAL_CREAM | Freq: Every day | VAGINAL | Status: DC
  Administered 2023-02-14 – 2023-02-15 (×2): 1 via VAGINAL
  Filled 2023-02-14: qty 45

## 2023-02-14 MED ORDER — METOCLOPRAMIDE HCL 5 MG/ML IJ SOLN
10.0000 mg | Freq: Four times a day (QID) | INTRAMUSCULAR | Status: DC | PRN
  Administered 2023-02-14 (×2): 10 mg via INTRAVENOUS
  Filled 2023-02-14 (×3): qty 2

## 2023-02-14 MED ORDER — ENOXAPARIN SODIUM 40 MG/0.4ML IJ SOSY
40.0000 mg | PREFILLED_SYRINGE | INTRAMUSCULAR | Status: DC
  Administered 2023-02-15: 40 mg via SUBCUTANEOUS
  Filled 2023-02-14: qty 0.4

## 2023-02-14 MED ORDER — ACETAMINOPHEN 500 MG PO TABS: 1000.0000 mg | ORAL_TABLET | Freq: Four times a day (QID) | ORAL | Status: DC | PRN

## 2023-02-14 MED ORDER — ALBUTEROL SULFATE (2.5 MG/3ML) 0.083% IN NEBU
3.0000 mL | INHALATION_SOLUTION | Freq: Four times a day (QID) | RESPIRATORY_TRACT | Status: DC | PRN
  Administered 2023-02-14: 3 mL via RESPIRATORY_TRACT
  Filled 2023-02-14: qty 3

## 2023-02-14 MED ORDER — LACTATED RINGERS IV SOLN: INTRAVENOUS | Status: DC

## 2023-02-14 MED ORDER — DEXTROSE 50 % IV SOLN: 0.0000 mL | INTRAVENOUS | Status: DC | PRN

## 2023-02-14 MED ORDER — PROMETHAZINE HCL 25 MG PO TABS
25.0000 mg | ORAL_TABLET | Freq: Four times a day (QID) | ORAL | Status: DC | PRN
  Administered 2023-02-14 – 2023-02-16 (×3): 25 mg via ORAL
  Filled 2023-02-14 (×3): qty 1

## 2023-02-14 MED ORDER — DIPHENHYDRAMINE HCL 25 MG PO CAPS
50.0000 mg | ORAL_CAPSULE | Freq: Four times a day (QID) | ORAL | Status: DC | PRN
  Administered 2023-02-14 (×2): 50 mg via ORAL
  Filled 2023-02-14 (×3): qty 2

## 2023-02-14 MED ORDER — DOCUSATE SODIUM 100 MG PO CAPS
100.0000 mg | ORAL_CAPSULE | Freq: Every day | ORAL | Status: DC
  Administered 2023-02-14 – 2023-02-15 (×2): 100 mg via ORAL
  Filled 2023-02-14 (×2): qty 1

## 2023-02-14 NOTE — Patient Instructions (Addendum)
Return to office for any scheduled appointments. Call the office or go to the MAU at [facility] at [facility] if: You begin to have strong, frequent contractions Your water breaks.  Sometimes it is a big gush of fluid, sometimes it is just a trickle that keeps getting your underwear wet or running down your legs You have vaginal bleeding.  It is normal to have a small amount of spotting if your cervix was checked.  You do not feel your baby moving like normal.  If you do not, get something to eat and drink and lay down and focus on feeling your baby move.   If your baby is still not moving like normal, you should call the office or go to MAU. Any other obstetric concerns.    RSV Vaccination for Pregnant People  CDC recommends two ways to protect babies from getting very sick with Respiratory Syncytial Virus (RSV):  An RSV vaccination given during pregnancy  Pf[employer]'s vaccine Verdis Frederickson) is recommended for use during pregnancy. It is given during RSV season to people who are 32 through [redacted] weeks pregnant.  Or, An RSV immunization given directly to infants and some older babies  Babies born to mothers who get RSV vaccine at least 2 weeks before delivery will have protection and, in most cases, should not need an RSV immunization later.    When is RSV season?  In most regions of the [place] RSV season starts in the fall and peaks in the winter, but the timing and severity of RSV season can vary from place to place and year to year.   The goal of maternal RSV vaccination is to protect babies from getting very sick with RSV during their first RSV season.  In most of the [place], this means maternal RSV vaccine will be given in September through January.  Who should get the maternal RSV vaccine?  People who are 1 through [redacted] weeks pregnant during September through January should get one dose of maternal RSV vaccine to protect their babies. RSV season can  vary around the country.   How is the maternal RSV vaccine administered?  Maternal RSV vaccine is given as a shot into the mother's upper arm. Only a single dose (one shot) of maternal RSV vaccine is recommended.   It is not yet known whether another dose might be needed in later pregnancies.  How well does the maternal RSV vaccine work?  When someone gets RSV vaccine, their body responds by making a protein that protects against the virus that causes RSV. The process takes about 2 weeks. When a pregnant person gets RSV vaccine, their protective proteins (called antibodies) also pass to their baby. So, babies who are born at least 2 weeks after their mother gets RSV vaccine are protected at birth, when infants are at the highest risk of severe RSV disease.   The vaccine can reduce a baby's risk of being hospitalized from RSV by 57% in the first six months after birth.  What are the possible side effects of the maternal RSV vaccine?  In the clinical trials, the side effects most often reported by pregnant people who received the maternal RSV vaccine were pain at the injection site, headache, muscle pain, and nausea.  Although not common, a dangerous high blood pressure condition called pre-eclampsia occurred in 1.8% of pregnant people who received the maternal RSV vaccine compared to 1.4% of pregnant people who received a placebo.  The clinical trials  a small increase in the number of preterm births in vaccinated pregnant people. It is not clear if this is a true safety problem related to RSV vaccine or if this occurred for reasons unrelated to vaccination.  To reduce the potential risk of preterm birth and complications from RSV disease, FDA approved the maternal RSV vaccine for use during weeks 32 through 36 of pregnancy while additional studies are conducted.  FDA is requiring the manufacturer to do additional studies that will look more closely at the potential risk of preterm  births and pregnancy-related high blood pressure issues in mothers, including pre-eclampsia.  Severe allergic reactions to vaccines are rare but can happen after any vaccine and can be life-threatening. If you see signs of a severe allergic reaction after vaccination (hives, swelling of the face and throat, difficulty breathing, a fast heartbeat, dizziness, or weakness), seek immediate medical care by calling 911.  As with any medicine or vaccine there is a very remote chance of the vaccine causing other serious injury or death after vaccination.  Adverse events following vaccination should be reported to the Vaccine Adverse Event Reporting System (VAERS), even if it's not clear that the vaccine caused the adverse event. You or your doctor can report an adverse event to CDC and FDA through VAERS. If you need further assistance reporting to VAERS, please email [email or web address] or call 1-800-822-7967.  If you have any questions about side effects from the maternal RSV vaccine, talk with your healthcare provider.  Do I need a prescription for a maternal RSV vaccine?  Until the vaccine available in the office, you will need a prescription to take to a local pharmacy that is providing the vaccine.   How do I pay for the maternal RSV vaccine?  Most private health insurance plans cover the maternal RSV vaccine, but there may be a cost to you depending on your plan.  Contact your insurer to find out.  Medicaid Beginning December 11, 2021, most people with coverage from Medicaid and Children's Health Insurance Program (CHIP) will be guaranteed coverage of all vaccines recommended by the Advisory Committee on Immunization Practice at no cost to them.   Source: National Center for Immunization and Respiratory Diseases  

## 2023-02-14 NOTE — H&P (Addendum)
FACULTY PRACTICE ANTEPARTUM ADMISSION HISTORY AND PHYSICAL NOTE   History of Present Illness: Kimberly Stark is a 32 y.o. [id number] at [redacted]w[redacted]d with hx cHTN, PE, prior CS x2, and GDM admitted for SIPE and HA, r/o SF  Seen in office by Dr. Mervyn Skeeters today with BP 167/89 with recheck 140/83. Reported mild headache despite tylenol. Was offered direct admission. She initially declined with plan to increase labetalol 300 TID to 600mg  TID. She presented to MAU when her headache began to worsen and she developed floaters in her vision. She took tylenol 1000mg  at 11:30am and phenergan earlier this morning. Has not eaten much today due to decreased appetite. Denies RUQ/epigastric pain. Denies ctx, VB, LOF. Reports good fetal movement.   In MAU patient has had mild range blood pressures despite taking labetalol 600mg  around 11:30a. Her labs are pending.  Her pregnancy is complicated by the following: - Prior CS x2: planned for repeat with tubal - cHTN: on labetalol 300mg  TID until today. Dx with SIPE in MAU 3 days ago by worsening Bps and PCR 1.34. Growth [place] yesterday with AGA fetus 2280g (71%), mild poly, and cephalic presentation.  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/13/2021   History of LEEP (loop electrosurgical excision procedure) of cervix complicating pregnancy in third trimester 11/21/2021   BMI 38.0-38.9,adult 11/21/2021   History of preterm delivery, currently pregnant in third trimester 11/21/2021   Post-traumatic stress disorder, chronic 09/19/2021   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   History of pulmonary embolus (PE) 06/22/2021   Tobacco abuse 06/22/2021    History of 2 cesarean sections 06/22/2021   History of HELLP syndrome, currently pregnant, third trimester 06/09/2021   Supervision of high risk pregnancy, antepartum 06/09/2021   History of cervical cerclage 06/09/2021   Iron deficiency anemia 07/03/2019    Past Medical History:  Diagnosis Date   Anemia    Asthma    Bipolar 1 disorder (HCC)    Blood transfusion without reported diagnosis    Chiari malformation type I (HCC)    Chronic post-traumatic stress disorder 09/19/2021   Glaucoma 09/19/2021   Heart murmur    History of postpartum hemorrhage    History of pulmonary embolism 2021   in setting of covid   Insomnia 09/19/2021   Migraine with typical aura 09/19/2021   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/2021   Pregnancy induced hypertension    Preterm labor    Sickle cell trait (HCC)    UTI (urinary tract infection)    Vaginal Pap smear, abnormal     Past Surgical History:  Procedure Laterality Date   CERVICAL BIOPSY  W/ LOOP ELECTRODE EXCISION     CERVICAL CERCLAGE     CESAREAN SECTION     CESAREAN SECTION N/A 12/30/2021   Procedure: CESAREAN SECTION;  Surgeon: Hermina Staggers, MD;  Location: [facility];  Service: Obstetrics;  Laterality: N/A;   DILATION AND CURETTAGE OF UTERUS      OB History  Gravida Para Term Preterm AB Living  12 3 2 1 8 3   SAB IAB Ectopic Multiple Live Births  7 1 0 0 3    # Outcome Date GA Lbr Len/2nd Weight  Sex Type Anes PTL Lv  12 Current           11 Term 12/30/21 [redacted]w[redacted]d  2900 g F CS-LTranv Spinal  LIV  10 Term 08/13/19 [redacted]w[redacted]d  3714 g  CS-LTranv  Y LIV     Complications: Preeclampsia, Cervical cerclage suture present, unspecified trimester  9 Preterm 11/23/12 [redacted]w[redacted]d  2500 g  Vag-Spont   LIV     Complications: Preeclampsia, History of cervical cerclage  8 IAB           7 SAB           6 SAB           5 SAB           4 SAB           3 SAB           2 SAB           1 SAB             Obstetric Comments  Pre-E with both preg,  induced with both    Social History   Socioeconomic History   Marital status: Single    Spouse name: Not on file   Number of children: Not on file   Years of education: Not on file   Highest education level: Bachelor's degree (e.g., BA, AB, BS)  Occupational History   Not on file  Tobacco Use   Smoking status: Some Days    Current packs/day: 0.25    Types: Cigarettes   Smokeless tobacco: Never  Vaping Use   Vaping status: Former   Substances: Nicotine  Substance and Sexual Activity   Alcohol use: Not Currently   Drug use: Not Currently   Sexual activity: Yes    Birth control/protection: None  Other Topics Concern   Not on file  Social History Narrative   Not on file   Social Determinants of Health   Financial Resource Strain: Not on File (11/14/2022)   Received from [employer]    Financial Resource Strain: 0  Food Insecurity: Not on File (12/08/2022)   Received from [facility] Insecurity    Food: 0  Transportation Needs: Not on File (11/14/2022)   Received from [employer] Needs    Transportation: 0  Physical Activity: Not on File (11/14/2022)   Received from [facility]   Physical Activity    Physical Activity: 0  Stress: Not on File (11/14/2022)   Received from [facility]   Stress    Stress: 0  Social Connections: Not on File (11/24/2022)   Received from [employer]   Social Connections    Connectedness: 0    Family History  Problem Relation Age of Onset   Stroke Mother    Heart disease Mother        hx open heart issues   Hypertension Mother    Diabetes Mother    Cancer Father        started as colon, w/mets   Kidney disease Father        renal failure   Diabetes Father    Cancer Maternal Grandmother    Diabetes Paternal Grandmother     Allergies  Allergen Reactions   Iodinated Contrast Media Shortness Of Breath, Rash and Swelling   Latex Anaphylaxis   Shellfish Allergy Anaphylaxis   Iodine Hives   Penicillins Hives     Medications Prior to Admission  Medication Sig Dispense Refill  Last Dose   acetaminophen (TYLENOL) 500 MG tablet Take 2 tablets (1,000 mg total) by mouth every 6 (six) hours. 30 tablet 0 02/14/2023 at 1130   aspirin EC 81 MG tablet Take 2 tablets (162 mg total) by mouth daily. (Patient taking differently: Take 81 mg by mouth 2 (two) times daily.) 180 tablet 3 02/14/2023   enoxaparin (LOVENOX) 40 MG/0.4ML injection Inject 0.4 mLs (40 mg total) into the skin daily. 12 mL 5 02/14/2023   labetalol (NORMODYNE) 300 MG tablet Take 2 tablets (600 mg total) by mouth 3 (three) times daily. 180 tablet 2 02/14/2023 at 1130   Prenatal MV & Min w/FA-DHA (PRENATAL GUMMIES PO) Take 1 tablet by mouth in the morning.   02/14/2023   promethazine (PHENERGAN) 25 MG tablet Take 1 tablet (25 mg total) by mouth every 6 (six) hours as needed for nausea or vomiting. 30 tablet 1 02/14/2023   Accu-Chek Softclix Lancets lancets To Check blood sugars 4 times a day. Fasting and 2 hours after the first bite of breakfast, lunch and dinner. 100 each 11    Adhesive Bandages (BAND-AID FLEXIBLE) MISC 1 Device by Does not apply route as needed. 100 each 11    albuterol (VENTOLIN HFA) 108 (90 Base) MCG/ACT inhaler Inhale 1-2 puffs into the lungs every 6 (six) hours as needed for wheezing or shortness of breath.      Alcohol Swabs 70 % PADS 1 Swab by Does not apply route as needed. 100 each 11    glucose blood (ACCU-CHEK GUIDE) test strip To check blood sugars 4 times a day. Fasting, and 2 hours after Breakfast, Lunch and Dinner 100 each 12    Ondansetron HCl (ZOFRAN PO) Take by mouth.      pantoprazole (PROTONIX) 40 MG tablet Take 1 tablet (40 mg total) by mouth daily. 30 tablet 5    terconazole (TERAZOL 7) 0.4 % vaginal cream Place 1 applicator vaginally at bedtime. Use for seven days 45 g 0     Review of Systems - Negative except as noted in HPI  Vitals:  BP (!) 155/82   Pulse 89   Temp 98.3 F (36.8 C) (Oral)   Resp 18   SpO2  100%  Physical Examination: CONSTITUTIONAL: Well-developed, well-nourished female in no acute distress.  NEUROLOGIC: Alert and oriented to person, place, and time.  CARDIOVASCULAR: Normal heart rate noted RESPIRATORY: Effort normal, no problems with respiration noted ABDOMEN: Soft, nontender, nondistended, gravid.  Cervix: deferred Membranes:intact Fetal Monitoring: 140bpm, moderate variability, no accels, no decels Tocometer: Flat  Labs:  Results for orders placed or performed during the hospital encounter of 02/14/23 (from the past 24 hour(s))  Urinalysis, Routine w reflex microscopic -Urine, Clean Catch   Collection Time: 02/14/23  1:32 PM  Result Value Ref Range   Color, Urine YELLOW YELLOW   APPearance HAZY (A) CLEAR   Specific Gravity, Urine 1.020 1.005 - 1.030   pH 6.0 5.0 - 8.0   Glucose, UA >=500 (A) NEGATIVE mg/dL   Hgb urine dipstick NEGATIVE NEGATIVE   Bilirubin Urine NEGATIVE NEGATIVE   Ketones, ur 5 (A) NEGATIVE mg/dL   Protein, ur 295 (A) NEGATIVE mg/dL   Nitrite NEGATIVE NEGATIVE   Leukocytes,Ua NEGATIVE NEGATIVE   RBC / HPF 6-10 0 - 5 RBC/hpf   WBC, UA 6-10 0 - 5 WBC/hpf   Bacteria, UA FEW (A) NONE SEEN   Squamous Epithelial / HPF 0-5 0 - 5 /HPF   Mucus PRESENT    Budding Yeast  PRESENT   Type and screen [facility]   Collection Time: 02/14/23  1:45 PM  Result Value Ref Range   ABO/RH(D) PENDING    Antibody Screen PENDING    Sample Expiration      02/17/2023,2359 Performed at [facility], [address] [place], [place] [address]     Imaging Studies: [place] MFM FETAL BPP WO NON STRESS  Result Date: 02/13/2023 ----------------------------------------------------------------------  OBSTETRICS REPORT                       (Signed Final 02/13/2023 04:33 pm) ---------------------------------------------------------------------- Patient Info  ID #:       [id number]                          D.O.B.:  02-08-1991 (32 yrs)(F)  Name:       [name]                   Visit Date: 02/13/2023 03:21 pm ---------------------------------------------------------------------- Performed By  Attending:        Ma Rings MD         Ref. Address:     [address]                                                             [place], [place]                                                             [address]  Performed By:     Isac Sarna        [facility]                                  [facility]  Referred By:      [facility] ---------------------------------------------------------------------- Orders  #  Description  Code        Ordered By  1  [place] MFM FETAL BPP WO NON               [id number]    RAVI SHANKAR     STRESS  2  [place] MFM OB FOLLOW UP                   [id number]    RAVI SHANKAR ----------------------------------------------------------------------  #  Order #                     Accession #                Episode #  1  [id number]                   [id number]                 [id number]  2  [id number]                   [id number]                 [id number] ---------------------------------------------------------------------- Indications  Hypertension - Chronic/Pre-existing            O10.019  (Labetalol)  Gestational diabetes in pregnancy, diet        O24.410  controlled  Medical complication of pregnancy (history     O26.90  of PE, Arnold-Chiari malformation, asthma,  Bipolar disease)  Obesity complicating pregnancy, third          O99.213  trimester  Cystic Fibrosis (CF) Carrier, third trimester  O09.893  Anemia during pregnancy in third trimester     O99.013  Poor obstetric history-Recurrent (habitual     O26.20  abortion, previous LEEP x 2, previous  HELLP, previous C/S x 2)  Poor obstetric history: Previous  preterm       O09.219  delivery, antepartum (30 weeks)  History of cervical cerclage, currently        O09.299  pregnant  Tobacco use complicating pregnancy, third      O99.333  trimester  [redacted] weeks gestation of pregnancy                Z3A.32  Encounter for other antenatal screening        Z36.2  follow-up  Polyhydramnios, third trimester, antepartum    O40.3XX0  condition or complication, unspecified fetus ---------------------------------------------------------------------- Fetal Evaluation  Num Of Fetuses:         1  Fetal Heart Rate(bpm):  149  Cardiac Activity:       Observed  Presentation:           Cephalic  Placenta:               Anterior  P. Cord Insertion:      Previously seen  Amniotic Fluid  AFI FV:      Polyhydramnios  AFI Sum(cm)     %Tile       Largest Pocket(cm)  25.98           96          9.94  RUQ(cm)       RLQ(cm)       LUQ(cm)        LLQ(cm)  9.94          6.89          4.48  4.67 ---------------------------------------------------------------------- Biophysical Evaluation  Amniotic F.V:   Pocket => 2 cm             F. Tone:        Observed  F. Movement:    Observed                   Score:          8/8  F. Breathing:   Observed ---------------------------------------------------------------------- Biometry  BPD:      79.4  mm     G. Age:  31w 6d         17  %    CI:        75.39   %    70 - 86                                                          FL/HC:      20.1   %    19.9 - 21.5  HC:       290   mm     G. Age:  32w 0d        4.1  %    HC/AC:      0.90        0.96 - 1.11  AC:      322.7  mm     G. Age:  36w 1d       > 99  %    FL/BPD:     73.4   %    71 - 87  FL:       58.3  mm     G. Age:  30w 3d        1.9  %    FL/AC:      18.1   %    20 - 24  Est. FW:    2280  gm           5 lb     71  % ---------------------------------------------------------------------- OB History  Blood Type:   O+  Gravidity:    12        Term:   2        Prem:   1        SAB:   7  TOP:          1         Living:  3 ---------------------------------------------------------------------- Gestational Age  U/S Today:     32w 4d                                        EDD:   04/06/23  Best:          32w 6d     Det. By:  U/S C R L  (09/21/22)    EDD:   04/04/23 ---------------------------------------------------------------------- Targeted Anatomy  Central Nervous System  Calvarium/Cranial V.:  Appears normal         Cereb./Vermis:          Previously seen  Cavum:                 Previously seen        [place]:  Previously seen  Lateral Ventricles:    Previously seen        Midline Falx:           Previously seen  Choroid Plexus:        Previously seen  Spine  Cervical:              Previously seen        Sacral:                 Previously seen  Thoracic:              Previously seen        Shape/Curvature:        Previously seen  Lumbar:                Previously seen  Head/Neck  Lips:                  Previously seen        Profile:                Previously seen  Neck:                  Previously seen        Orbits/Eyes:            Previously seen  Nuchal Fold:           Previously seen        Mandible:               Previously seen  Nasal Bone:            Present                Maxilla:                Previously seen  Thorax  4 Chamber View:        Previously seen        Interventr. Septum:     Previously seen  Cardiac Rhythm:        Normal                 Cardiac Axis:           Normal  Cardiac Situs:         Previously seen        Diaphragm:              Appears normal  Rt Outflow Tract:      Previously seen        3 Vessel View:          Previously seen  Lt Outflow Tract:      Previously seen        3 V Trachea View:       Previously seen  Aortic Arch:           Appears normal         IVC:                    Previously seen  Ductal Arch:           Previously seen        Crossing:               Previously seen  SVC:                   Previously seen  Abdomen  Ventral Wall:  Previously seen         Lt Kidney:              Appears normal  Cord Insertion:        Previously seen        Rt Kidney:              Appears normal  Situs:                 Previously seen        Bladder:                Appears normal  Stomach:               Appears normal  Extremities  Lt Humerus:            Previously seen        Lt Femur:               Previously seen  Rt Humerus:            Previously seen        Rt Femur:               Previously seen  Lt Forearm:            Previously seen        Lt Lower Leg:           Previously seen  Rt Forearm:            Previously seen        Rt Lower Leg:           Previously seen  Lt Hand:               Previously seen        Lt Foot:                Previously seen  Rt Hand:               Previously seen        Rt Foot:                Previously seen  Other  Umbilical Cord:        Previously seen        Genitalia:              Female prev seen ---------------------------------------------------------------------- Comments  This patient was seen for a follow up exam due to chronic  hypertension treated with labetalol 300 mg 3 times a day,  history of a prior pulmonary embolus treated with prophylactic  Lovenox, and recently diagnosed diet-controlled gestational  diabetes.  The patient was evaluated in the MAU 2 days ago due to  elevated blood pressures.  Her PIH labs were all within  normal limits.  However, her P/C ratio was 1.34 indicating that  she is probably developing preeclampsia.  The patient's  blood pressures today were 162/72 and 154/79.  She denies  any signs or symptoms of preeclampsia.  She was informed that the fetal growth appears appropriate  for her gestational age.  Mild polyhydramnios with a total AFI  of 25.98 cm was noted.  A BPP performed today was 8 out of 8.  Due to chronic hypertension with superimposed  preeclampsia, the patient was advised that the goal for her  delivery would be at 34 weeks or greater.  Preeclampsia precautions were reviewed today.  She was  advised  to  continue to monitor her blood pressures at home  and to call should her blood pressures be in the severe  range.  She will return in 1 week for another BPP. ----------------------------------------------------------------------                   Ma Rings, MD Electronically Signed Final Report   02/13/2023 04:33 pm ----------------------------------------------------------------------   [place] MFM OB FOLLOW UP  Result Date: 02/13/2023 ----------------------------------------------------------------------  OBSTETRICS REPORT                       (Signed Final 02/13/2023 04:33 pm) ---------------------------------------------------------------------- Patient Info  ID #:       [id number]                          D.O.B.:  03-14-1990 (32 yrs)(F)  Name:       [name]                   Visit Date: 02/13/2023 03:21 pm ---------------------------------------------------------------------- Performed By  Attending:        Ma Rings MD         Ref. Address:     [address]                                                             [place], [place]                                                             [address]  Performed By:     Isac Sarna        [facility]                                  [facility]  Referred By:      [facility] ---------------------------------------------------------------------- Orders  #  Description  Code        Ordered By  1  [place] MFM FETAL BPP WO NON               [id number]    RAVI SHANKAR     STRESS  2  [place] MFM OB FOLLOW UP                   [id number]    RAVI SHANKAR ----------------------------------------------------------------------  #  Order #                     Accession #                Episode #  1  [id number]                    [id number]                 [id number]  2  [id number]                   [id number]                 [id number] ---------------------------------------------------------------------- Indications  Hypertension - Chronic/Pre-existing            O10.019  (Labetalol)  Gestational diabetes in pregnancy, diet        O24.410  controlled  Medical complication of pregnancy (history     O26.90  of PE, Arnold-Chiari malformation, asthma,  Bipolar disease)  Obesity complicating pregnancy, third          O99.213  trimester  Cystic Fibrosis (CF) Carrier, third trimester  O09.893  Anemia during pregnancy in third trimester     O99.013  Poor obstetric history-Recurrent (habitual     O26.20  abortion, previous LEEP x 2, previous  HELLP, previous C/S x 2)  Poor obstetric history: Previous preterm       O09.219  delivery, antepartum (30 weeks)  History of cervical cerclage, currently        O09.299  pregnant  Tobacco use complicating pregnancy, third      O99.333  trimester  [redacted] weeks gestation of pregnancy                Z3A.32  Encounter for other antenatal screening        Z36.2  follow-up  Polyhydramnios, third trimester, antepartum    O40.3XX0  condition or complication, unspecified fetus ---------------------------------------------------------------------- Fetal Evaluation  Num Of Fetuses:         1  Fetal Heart Rate(bpm):  149  Cardiac Activity:       Observed  Presentation:           Cephalic  Placenta:               Anterior  P. Cord Insertion:      Previously seen  Amniotic Fluid  AFI FV:      Polyhydramnios  AFI Sum(cm)     %Tile       Largest Pocket(cm)  25.98           96          9.94  RUQ(cm)       RLQ(cm)       LUQ(cm)        LLQ(cm)  9.94          6.89          4.48  4.67 ---------------------------------------------------------------------- Biophysical Evaluation  Amniotic F.V:   Pocket => 2 cm             F. Tone:        Observed  F. Movement:    Observed                   Score:          8/8  F. Breathing:    Observed ---------------------------------------------------------------------- Biometry  BPD:      79.4  mm     G. Age:  31w 6d         17  %    CI:        75.39   %    70 - 86                                                          FL/HC:      20.1   %    19.9 - 21.5  HC:       290   mm     G. Age:  32w 0d        4.1  %    HC/AC:      0.90        0.96 - 1.11  AC:      322.7  mm     G. Age:  36w 1d       > 99  %    FL/BPD:     73.4   %    71 - 87  FL:       58.3  mm     G. Age:  30w 3d        1.9  %    FL/AC:      18.1   %    20 - 24  Est. FW:    2280  gm           5 lb     71  % ---------------------------------------------------------------------- OB History  Blood Type:   O+  Gravidity:    12        Term:   2        Prem:   1        SAB:   7  TOP:          1        Living:  3 ---------------------------------------------------------------------- Gestational Age  U/S Today:     32w 4d                                        EDD:   04/06/23  Best:          32w 6d     Det. By:  U/S C R L  (09/21/22)    EDD:   04/04/23 ---------------------------------------------------------------------- Targeted Anatomy  Central Nervous System  Calvarium/Cranial V.:  Appears normal         Cereb./Vermis:          Previously seen  Cavum:                 Previously seen        [place]:  Previously seen  Lateral Ventricles:    Previously seen        Midline Falx:           Previously seen  Choroid Plexus:        Previously seen  Spine  Cervical:              Previously seen        Sacral:                 Previously seen  Thoracic:              Previously seen        Shape/Curvature:        Previously seen  Lumbar:                Previously seen  Head/Neck  Lips:                  Previously seen        Profile:                Previously seen  Neck:                  Previously seen        Orbits/Eyes:            Previously seen  Nuchal Fold:           Previously seen        Mandible:               Previously seen  Nasal  Bone:            Present                Maxilla:                Previously seen  Thorax  4 Chamber View:        Previously seen        Interventr. Septum:     Previously seen  Cardiac Rhythm:        Normal                 Cardiac Axis:           Normal  Cardiac Situs:         Previously seen        Diaphragm:              Appears normal  Rt Outflow Tract:      Previously seen        3 Vessel View:          Previously seen  Lt Outflow Tract:      Previously seen        3 V Trachea View:       Previously seen  Aortic Arch:           Appears normal         IVC:                    Previously seen  Ductal Arch:           Previously seen        Crossing:               Previously seen  SVC:                   Previously seen  Abdomen  Ventral Wall:  Previously seen        Lt Kidney:              Appears normal  Cord Insertion:        Previously seen        Rt Kidney:              Appears normal  Situs:                 Previously seen        Bladder:                Appears normal  Stomach:               Appears normal  Extremities  Lt Humerus:            Previously seen        Lt Femur:               Previously seen  Rt Humerus:            Previously seen        Rt Femur:               Previously seen  Lt Forearm:            Previously seen        Lt Lower Leg:           Previously seen  Rt Forearm:            Previously seen        Rt Lower Leg:           Previously seen  Lt Hand:               Previously seen        Lt Foot:                Previously seen  Rt Hand:               Previously seen        Rt Foot:                Previously seen  Other  Umbilical Cord:        Previously seen        Genitalia:              Female prev seen ---------------------------------------------------------------------- Comments  This patient was seen for a follow up exam due to chronic  hypertension treated with labetalol 300 mg 3 times a day,  history of a prior pulmonary embolus treated with prophylactic  Lovenox, and recently  diagnosed diet-controlled gestational  diabetes.  The patient was evaluated in the MAU 2 days ago due to  elevated blood pressures.  Her PIH labs were all within  normal limits.  However, her P/C ratio was 1.34 indicating that  she is probably developing preeclampsia.  The patient's  blood pressures today were 162/72 and 154/79.  She denies  any signs or symptoms of preeclampsia.  She was informed that the fetal growth appears appropriate  for her gestational age.  Mild polyhydramnios with a total AFI  of 25.98 cm was noted.  A BPP performed today was 8 out of 8.  Due to chronic hypertension with superimposed  preeclampsia, the patient was advised that the goal for her  delivery would be at 34 weeks or greater.  Preeclampsia precautions were reviewed today.  She was  advised to continue  to monitor her blood pressures at home  and to call should her blood pressures be in the severe  range.  She will return in 1 week for another BPP. ----------------------------------------------------------------------                   Ma Rings, MD Electronically Signed Final Report   02/13/2023 04:33 pm ----------------------------------------------------------------------   [place] [facility] OB FOLLOW UP  Result Date: 01/15/2023 ----------------------------------------------------------------------  OBSTETRICS REPORT                       (Signed Final 01/15/2023 04:24 pm) ---------------------------------------------------------------------- Patient Info  ID #:       [id number]                          D.O.B.:  1990/04/04 (32 yrs)  Name:       [name]                   Visit Date: 01/15/2023 03:35 pm ---------------------------------------------------------------------- Performed By  Attending:        Noralee Space MD        Ref. Address:     [address]                                                             [place], [place]                                                             [address]  Performed By:     Marcellina Millin        [facility]  Referred By:      [facility] ---------------------------------------------------------------------- Orders  #  Description  Code        Ordered By  1  [place] MFM OB FOLLOW UP                   [id number]    Braxton Feathers ----------------------------------------------------------------------  #  Order #                     Accession #                Episode #  1  [id number]                   [id number]                 [id number] ---------------------------------------------------------------------- Indications  Hypertension - Chronic/Pre-existing            O10.019  (Labetalol)  Gestational diabetes in pregnancy, diet        O24.410  controlled  Medical complication of pregnancy (history     O26.90  of PE, Arnold-Chiari malformation, asthma,  Bipolar disease)  Obesity complicating pregnancy, third          O99.213  trimester  Cystic Fibrosis (CF) Carrier, third trimester  O09.893  Anemia during pregnancy in third trimester     O99.013  Poor obstetric history-Recurrent (habitual     O26.20  abortion, previous LEEP x 2, previous  HELLP, previous C/S x 2)  Poor obstetric history: Previous preterm       O09.219  delivery, antepartum (30 weeks)  History of cervical cerclage, currently        O09.299  pregnant  Tobacco use complicating pregnancy, third      O99.333  trimester  Encounter for other antenatal screening        Z36.2  follow-up  [redacted] weeks gestation of pregnancy                Z3A.28 ---------------------------------------------------------------------- Vital Signs  BP:          145/66 ---------------------------------------------------------------------- Fetal Evaluation  Num Of Fetuses:         1  Fetal Heart  Rate(bpm):  145  Cardiac Activity:       Observed  Presentation:           Breech  Placenta:               Anterior  P. Cord Insertion:      Previously seen  Amniotic Fluid  AFI FV:      Within normal limits  AFI Sum(cm)     %Tile       Largest Pocket(cm)  17.81           67          6.26  RUQ(cm)       RLQ(cm)       LUQ(cm)        LLQ(cm)  6.26          3.23          4.9            3.42 ---------------------------------------------------------------------- Biometry  BPD:        69  mm     G. Age:  27w 5d         12  %    CI:        70.77   %    70 - 86  FL/HC:      20.2   %    19.6 - 20.8  HC:      261.4  mm     G. Age:  28w 3d         13  %    HC/AC:      1.09        0.99 - 1.21  AC:      239.9  mm     G. Age:  28w 2d         30  %    FL/BPD:     76.7   %    71 - 87  FL:       52.9  mm     G. Age:  28w 1d         20  %    FL/AC:      22.1   %    20 - 24  Est. FW:    1190  gm    2 lb 10 oz      20  % ---------------------------------------------------------------------- OB History  Blood Type:   O+  Gravidity:    12        Term:   2        Prem:   1        SAB:   7  TOP:          1        Living:  3 ---------------------------------------------------------------------- Gestational Age  U/S Today:     28w 1d                                        EDD:   04/08/23  Best:          28w 5d     Det. By:  U/S C R L  (09/21/22)    EDD:   04/04/23 ---------------------------------------------------------------------- Targeted Anatomy  Central Nervous System  Calvarium/Cranial V.:  Appears normal         Cereb./Vermis:          Previously seen  Cavum:                 Previously seen        Sales executive:         Previously seen  Lateral Ventricles:    Previously seen        Midline Falx:           Previously seen  Choroid Plexus:        Previously seen  Spine  Cervical:              Previously seen        Sacral:                 Previously seen  Thoracic:               Previously seen        Shape/Curvature:        Previously seen  Lumbar:                Previously seen  Head/Neck  Lips:                  Previously seen        Profile:  Previously seen  Neck:                  Previously seen        Orbits/Eyes:            Previously seen  Nuchal Fold:           Previously seen        Mandible:               Previously seen  Nasal Bone:            Present                Maxilla:                Previously seen  Thorax  4 Chamber View:        Previously seen        Interventr. Septum:     Previously seen  Cardiac Rhythm:        Normal                 Cardiac Axis:           Normal  Cardiac Situs:         Previously seen        Diaphragm:              Appears normal  Rt Outflow Tract:      Previously seen        3 Vessel View:          Previously seen  Lt Outflow Tract:      Previously seen        3 V Trachea View:       Previously seen  Aortic Arch:           Appears normal         IVC:                    Previously seen  Ductal Arch:           Previously seen        Crossing:               Previously seen  SVC:                   Previously seen  Abdomen  Ventral Wall:          Previously seen        Lt Kidney:              Appears normal  Cord Insertion:        Previously seen        Rt Kidney:              Appears normal  Situs:                 Previously seen        Bladder:                Appears normal  Stomach:               Appears normal  Extremities  Lt Humerus:            Previously seen        Lt Femur:               Previously seen  Rt Humerus:  Previously seen        Rt Femur:               Previously seen  Lt Forearm:            Previously seen        Lt Lower Leg:           Previously seen  Rt Forearm:            Previously seen        Rt Lower Leg:           Previously seen  Lt Hand:               Previously seen        Lt Foot:                Previously seen  Rt Hand:               Previously seen        Rt Foot:                Previously seen   Other  Umbilical Cord:        Previously seen        Genitalia:              Female prev seen  Comment:     Fetal anatomic survey complete. ---------------------------------------------------------------------- Impression  Chronic pretension.  Patient takes labetalol.  Newly diagnosed gestational diabetes.  Patient will be  meeting with our diabetic educator.  History of pulmonary embolism.  Patient is on Lovenox.  Patient had a death in the family.  Her mother recently  passed away.  Fetal growth is appropriate for gestational age.  Amniotic fluid  normal good fetal activity seen.  I reassured the patient of normal fetal growth assessment. ---------------------------------------------------------------------- Recommendations  - Appointment was made for her to return in 4 weeks for fetal  growth assessment and BPP.  -Weekly BPP from [redacted] weeks gestation till delivery. ----------------------------------------------------------------------                 Noralee Space, MD Electronically Signed Final Report   01/15/2023 04:24 pm ----------------------------------------------------------------------   Assessment and Plan: Patient Active Problem List   Diagnosis Date Noted   Chronic hypertension with superimposed preeclampsia 02/14/2023   GDM (gestational diabetes mellitus) 01/03/2023   Unwanted fertility 10/24/2022   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/13/2021   History of LEEP (loop electrosurgical excision procedure) of cervix complicating pregnancy in third trimester 11/21/2021   BMI 38.0-38.9,adult 11/21/2021   History of preterm delivery, currently pregnant in third trimester 11/21/2021   Post-traumatic stress disorder, chronic 09/19/2021   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   History of pulmonary embolus (PE) 06/22/2021   Tobacco abuse 06/22/2021   History of 2 cesarean sections 06/22/2021   History of HELLP syndrome, currently pregnant, third trimester 06/09/2021   Supervision of high risk pregnancy,  antepartum 06/09/2021   History of cervical cerclage 06/09/2021   Iron deficiency anemia 07/03/2019   SIPE Admit to Antenatal Will monitor BP q1min x 2 hours then space vitals per unit routine CBC, CMP, PCR pending Continue labetalol 600mg  TID Will treat HA with reglan/benadryl as patient recently took tylenol If HA persists, will administer BMZ, mag for seizure ppx and consider moving forward with delivery  2. History of PE p lovenox (last dose prior to 11 AM today), SCDs  3. GDM CBG fasting and 2h PP  4. FWB EFW  2280g (71%), cephalic on 12/3 Daily NST Will consult NICU if moving towards delivery  5. Prior CS, request for permanent sterilization For repeat with tubal  Harvie Bridge, MD Obstetrician & Gynecologist, [facility], [facility]

## 2023-02-14 NOTE — Progress Notes (Signed)
PRENATAL VISIT NOTE  Subjective:  Kimberly Stark is a 32 y.o. [id number] at [redacted]w[redacted]d being seen today for ongoing prenatal care.  She is currently monitored for the following issues for this high-risk pregnancy and has History of HELLP syndrome, currently pregnant, third trimester; Supervision of high risk pregnancy, antepartum; History of cervical cerclage; History of pulmonary embolus (PE); Tobacco abuse; History of 2 cesarean sections; Cystic fibrosis carrier; Post-traumatic stress disorder, chronic; History of LEEP (loop electrosurgical excision procedure) of cervix complicating pregnancy in third trimester; BMI 38.0-38.9,adult; Obesity complicating pregnancy in third trimester; History of preterm delivery, currently pregnant in third trimester; Arnold-Chiari malformation (HCC); Iron deficiency anemia; Chronic hypertension affecting pregnancy; Unwanted fertility; History of preterm delivery; GDM (gestational diabetes mellitus); and Chronic hypertension with superimposed preeclampsia on their problem list.  Patient reports headache that is currently 3/10 now after taking Tylenol.  No visual changes, no RUQ pain.  Had a few severe range BPs last few days, but improved. Had a MAU visit.  Diagnosed with superimposed preeclampsia.  Contractions: Not present. Vag. Bleeding: None.  Movement: Present. Denies leaking of fluid.   The following portions of the patient's history were reviewed and updated as appropriate: allergies, current medications, past family history, past medical history, past social history, past surgical history and problem list.   Objective:   Vitals:   02/14/23 1042 02/14/23 1049  BP: (!) 167/89 (!) 140/83  Pulse: 99   Weight: 225 lb 11.2 oz (102.4 kg)     Fetal Status:     Movement: Present     General:  Alert, oriented and cooperative. Patient is in no acute distress.  Skin: Skin is warm and dry. No rash noted.   Cardiovascular: Normal heart rate noted  Respiratory: Normal  respiratory effort, no problems with respiration noted  Abdomen: Soft, gravid, appropriate for gestational age.  Pain/Pressure: Absent     Pelvic: Cervical exam deferred        Extremities: Normal range of motion.  Edema: Trace  Mental Status: Normal mood and affect. Normal behavior. Normal judgment and thought content.    [place] MFM FETAL BPP WO NON STRESS  Result Date: 02/13/2023 ----------------------------------------------------------------------  OBSTETRICS REPORT                       (Signed Final 02/13/2023 04:33 pm) ---------------------------------------------------------------------- Patient Info  ID #:       [id number]                          D.O.B.:  Jul 02, 1990 (32 yrs)(F)  Name:       Kimberly Stark                   Visit Date: 02/13/2023 03:21 pm ---------------------------------------------------------------------- Performed By  Attending:        Ma Rings MD         Ref. Address:     [address]                                                             [place], [place]  [address]  Performed By:     Isac Sarna        [facility]  Referred By:      [facility] ---------------------------------------------------------------------- Orders  #  Description                           Code        Ordered By  1  [place] MFM FETAL BPP WO NON               76819.01    RAVI SHANKAR     STRESS  2  [place] MFM OB FOLLOW UP                   76816.01    RAVI SHANKAR ----------------------------------------------------------------------  #  Order #                     Accession #                Episode #  1  [id number]                   [id number]                 [id number]  2  [id number]                    [id number]                 [id number] ---------------------------------------------------------------------- Indications  Hypertension - Chronic/Pre-existing            O10.019  (Labetalol)  Gestational diabetes in pregnancy, diet        O24.410  controlled  Medical complication of pregnancy (history     O26.90  of PE, Arnold-Chiari malformation, asthma,  Bipolar disease)  Obesity complicating pregnancy, third          O99.213  trimester  Cystic Fibrosis (CF) Carrier, third trimester  O09.893  Anemia during pregnancy in third trimester     O99.013  Poor obstetric history-Recurrent (habitual     O26.20  abortion, previous LEEP x 2, previous  HELLP, previous C/S x 2)  Poor obstetric history: Previous preterm  O09.219  delivery, antepartum (30 weeks)  History of cervical cerclage, currently        O09.299  pregnant  Tobacco use complicating pregnancy, third      O99.333  trimester  [redacted] weeks gestation of pregnancy                Z3A.32  Encounter for other antenatal screening        Z36.2  follow-up  Polyhydramnios, third trimester, antepartum    O40.3XX0  condition or complication, unspecified fetus ---------------------------------------------------------------------- Fetal Evaluation  Num Of Fetuses:         1  Fetal Heart Rate(bpm):  149  Cardiac Activity:       Observed  Presentation:           Cephalic  Placenta:               Anterior  P. Cord Insertion:      Previously seen  Amniotic Fluid  AFI FV:      Polyhydramnios  AFI Sum(cm)     %Tile       Largest Pocket(cm)  25.98           96          9.94  RUQ(cm)       RLQ(cm)       LUQ(cm)        LLQ(cm)  9.94          6.89          4.48           4.67 ---------------------------------------------------------------------- Biophysical Evaluation  Amniotic F.V:   Pocket => 2 cm             F. Tone:        Observed  F. Movement:    Observed                   Score:          8/8  F. Breathing:   Observed  ---------------------------------------------------------------------- Biometry  BPD:      79.4  mm     G. Age:  31w 6d         17  %    CI:        75.39   %    70 - 86                                                          FL/HC:      20.1   %    19.9 - 21.5  HC:       290   mm     G. Age:  32w 0d        4.1  %    HC/AC:      0.90        0.96 - 1.11  AC:      322.7  mm     G. Age:  36w 1d       > 99  %    FL/BPD:     73.4   %    71 - 87  FL:       58.3  mm     G. Age:  30w 3d        1.9  %  FL/AC:      18.1   %    20 - 24  Est. FW:    2280  gm           5 lb     71  % ---------------------------------------------------------------------- OB History  Blood Type:   O+  Gravidity:    12        Term:   2        Prem:   1        SAB:   7  TOP:          1        Living:  3 ---------------------------------------------------------------------- Gestational Age  U/S Today:     32w 4d                                        EDD:   04/06/23  Best:          32w 6d     Det. By:  U/S C R L  (09/21/22)    EDD:   04/04/23 ---------------------------------------------------------------------- Targeted Anatomy  Central Nervous System  Calvarium/Cranial V.:  Appears normal         Cereb./Vermis:          Previously seen  Cavum:                 Previously seen        [place] [employer]:         Previously seen  Lateral Ventricles:    Previously seen        Midline Falx:           Previously seen  Choroid Plexus:        Previously seen  Spine  Cervical:              Previously seen        Sacral:                 Previously seen  Thoracic:              Previously seen        Shape/Curvature:        Previously seen  Lumbar:                Previously seen  Head/Neck  Lips:                  Previously seen        Profile:                Previously seen  Neck:                  Previously seen        Orbits/Eyes:            Previously seen  Nuchal Fold:           Previously seen        Mandible:               Previously seen  Nasal Bone:             Present                Maxilla:                Previously seen  Thorax  4 Chamber View:  Previously seen        Interventr. Septum:     Previously seen  Cardiac Rhythm:        Normal                 Cardiac Axis:           Normal  Cardiac Situs:         Previously seen        Diaphragm:              Appears normal  Rt Outflow Tract:      Previously seen        3 Vessel View:          Previously seen  Lt Outflow Tract:      Previously seen        3 V Trachea View:       Previously seen  Aortic Arch:           Appears normal         IVC:                    Previously seen  Ductal Arch:           Previously seen        Crossing:               Previously seen  SVC:                   Previously seen  Abdomen  Ventral Wall:          Previously seen        Lt Kidney:              Appears normal  Cord Insertion:        Previously seen        Rt Kidney:              Appears normal  Situs:                 Previously seen        Bladder:                Appears normal  Stomach:               Appears normal  Extremities  Lt Humerus:            Previously seen        Lt Femur:               Previously seen  Rt Humerus:            Previously seen        Rt Femur:               Previously seen  Lt Forearm:            Previously seen        Lt Lower Leg:           Previously seen  Rt Forearm:            Previously seen        Rt Lower Leg:           Previously seen  Lt Hand:               Previously seen        Lt Foot:  Previously seen  Rt Hand:               Previously seen        Rt Foot:                Previously seen  Other  Umbilical Cord:        Previously seen        Genitalia:              Female prev seen ---------------------------------------------------------------------- Comments  This patient was seen for a follow up exam due to chronic  hypertension treated with labetalol 300 mg 3 times a day,  history of a prior pulmonary embolus treated with prophylactic  Lovenox, and recently diagnosed  diet-controlled gestational  diabetes.  The patient was evaluated in the MAU 2 days ago due to  elevated blood pressures.  Her PIH labs were all within  normal limits.  However, her P/C ratio was 1.34 indicating that  she is probably developing preeclampsia.  The patient's  blood pressures today were 162/72 and 154/79.  She denies  any signs or symptoms of preeclampsia.  She was informed that the fetal growth appears appropriate  for her gestational age.  Mild polyhydramnios with a total AFI  of 25.98 cm was noted.  A BPP performed today was 8 out of 8.  Due to chronic hypertension with superimposed  preeclampsia, the patient was advised that the goal for her  delivery would be at 34 weeks or greater.  Preeclampsia precautions were reviewed today.  She was  advised to continue to monitor her blood pressures at home  and to call should her blood pressures be in the severe  range.  She will return in 1 week for another BPP. ----------------------------------------------------------------------                   Ma Rings, MD Electronically Signed Final Report   02/13/2023 04:33 pm ----------------------------------------------------------------------   [place] MFM OB FOLLOW UP  Result Date: 02/13/2023 ----------------------------------------------------------------------  OBSTETRICS REPORT                       (Signed Final 02/13/2023 04:33 pm) ---------------------------------------------------------------------- Patient Info  ID #:       [id number]                          D.O.B.:  [birth year] (32 yrs)(F)  Name:       Kimberly Stark                   Visit Date: 02/13/2023 03:21 pm ---------------------------------------------------------------------- Performed By  Attending:        Ma Rings MD         Ref. Address:     [address]                                                             [place], [place]  [address]  Performed By:     Isac Sarna         [facility]                                  [facility]  Referred By:      [facility] ---------------------------------------------------------------------- Orders  #  Description                           Code        Ordered By  1  [place] MFM FETAL BPP WO NON               76819.01    RAVI SHANKAR     STRESS  2  [place] MFM OB FOLLOW UP                   76816.01    RAVI SHANKAR ----------------------------------------------------------------------  #  Order #                     Accession #                Episode #  1  [id number]                   [id number]                 [id number]  2  [id number]                   [id number]                 [id number] ---------------------------------------------------------------------- Indications  Hypertension - Chronic/Pre-existing            O10.019  (Labetalol)  Gestational diabetes in pregnancy, diet        O24.410  controlled  Medical complication of pregnancy (history     O26.90  of PE, Arnold-Chiari malformation, asthma,  Bipolar disease)  Obesity complicating pregnancy, third          O99.213  trimester  Cystic Fibrosis (CF) Carrier, third trimester  O09.893  Anemia during pregnancy in third trimester     O99.013  Poor obstetric history-Recurrent (habitual     O26.20  abortion, previous LEEP x 2, previous  HELLP, previous C/S x 2)  Poor obstetric history: Previous preterm  O09.219  delivery, antepartum (30 weeks)  History of cervical cerclage, currently        O09.299  pregnant  Tobacco use complicating pregnancy, third      O99.333  trimester  [redacted] weeks gestation of pregnancy                Z3A.32  Encounter for other antenatal screening        Z36.2  follow-up  Polyhydramnios, third trimester, antepartum    O40.3XX0  condition or complication,  unspecified fetus ---------------------------------------------------------------------- Fetal Evaluation  Num Of Fetuses:         1  Fetal Heart Rate(bpm):  149  Cardiac Activity:       Observed  Presentation:           Cephalic  Placenta:               Anterior  P. Cord Insertion:      Previously seen  Amniotic Fluid  AFI FV:      Polyhydramnios  AFI Sum(cm)     %Tile       Largest Pocket(cm)  25.98           96          9.94  RUQ(cm)       RLQ(cm)       LUQ(cm)        LLQ(cm)  9.94          6.89          4.48           4.67 ---------------------------------------------------------------------- Biophysical Evaluation  Amniotic F.V:   Pocket => 2 cm             F. Tone:        Observed  F. Movement:    Observed                   Score:          8/8  F. Breathing:   Observed ---------------------------------------------------------------------- Biometry  BPD:      79.4  mm     G. Age:  31w 6d         17  %    CI:        75.39   %    70 - 86                                                          FL/HC:      20.1   %    19.9 - 21.5  HC:       290   mm     G. Age:  32w 0d        4.1  %    HC/AC:      0.90        0.96 - 1.11  AC:      322.7  mm     G. Age:  36w 1d       > 99  %    FL/BPD:     73.4   %    71 - 87  FL:       58.3  mm     G. Age:  30w 3d        1.9  %  FL/AC:      18.1   %    20 - 24  Est. FW:    2280  gm           5 lb     71  % ---------------------------------------------------------------------- OB History  Blood Type:   O+  Gravidity:    12        Term:   2        Prem:   1        SAB:   7  TOP:          1        Living:  3 ---------------------------------------------------------------------- Gestational Age  U/S Today:     32w 4d                                        EDD:   04/06/23  Best:          32w 6d     Det. By:  U/S C R L  (09/21/22)    EDD:   04/04/23 ---------------------------------------------------------------------- Targeted Anatomy  Central Nervous System  Calvarium/Cranial V.:   Appears normal         Cereb./Vermis:          Previously seen  Cavum:                 Previously seen         [employer] [place]:         Previously seen  Lateral Ventricles:    Previously seen        Midline Falx:           Previously seen  Choroid Plexus:        Previously seen  Spine  Cervical:              Previously seen        Sacral:                 Previously seen  Thoracic:              Previously seen        Shape/Curvature:        Previously seen  Lumbar:                Previously seen  Head/Neck  Lips:                  Previously seen        Profile:                Previously seen  Neck:                  Previously seen        Orbits/Eyes:            Previously seen  Nuchal Fold:           Previously seen        Mandible:               Previously seen  Nasal Bone:            Present                Maxilla:                Previously seen  Thorax  4 Chamber View:  Previously seen        Interventr. Septum:     Previously seen  Cardiac Rhythm:        Normal                 Cardiac Axis:           Normal  Cardiac Situs:         Previously seen        Diaphragm:              Appears normal  Rt Outflow Tract:      Previously seen        3 Vessel View:          Previously seen  Lt Outflow Tract:      Previously seen        3 V Trachea View:       Previously seen  Aortic Arch:           Appears normal         IVC:                    Previously seen  Ductal Arch:           Previously seen        Crossing:               Previously seen  SVC:                   Previously seen  Abdomen  Ventral Wall:          Previously seen        Lt Kidney:              Appears normal  Cord Insertion:        Previously seen        Rt Kidney:              Appears normal  Situs:                 Previously seen        Bladder:                Appears normal  Stomach:               Appears normal  Extremities  Lt Humerus:            Previously seen        Lt Femur:               Previously seen  Rt Humerus:            Previously seen         Rt Femur:               Previously seen  Lt Forearm:            Previously seen        Lt Lower Leg:           Previously seen  Rt Forearm:            Previously seen        Rt Lower Leg:           Previously seen  Lt Hand:               Previously seen        Lt Foot:  Previously seen  Rt Hand:               Previously seen        Rt Foot:                Previously seen  Other  Umbilical Cord:        Previously seen        Genitalia:              Female prev seen ---------------------------------------------------------------------- Comments  This patient was seen for a follow up exam due to chronic  hypertension treated with labetalol 300 mg 3 times a day,  history of a prior pulmonary embolus treated with prophylactic  Lovenox, and recently diagnosed diet-controlled gestational  diabetes.  The patient was evaluated in the MAU 2 days ago due to  elevated blood pressures.  Her PIH labs were all within  normal limits.  However, her P/C ratio was 1.34 indicating that  she is probably developing preeclampsia.  The patient's  blood pressures today were 162/72 and 154/79.  She denies  any signs or symptoms of preeclampsia.  She was informed that the fetal growth appears appropriate  for her gestational age.  Mild polyhydramnios with a total AFI  of 25.98 cm was noted.  A BPP performed today was 8 out of 8.  Due to chronic hypertension with superimposed  preeclampsia, the patient was advised that the goal for her  delivery would be at 34 weeks or greater.  Preeclampsia precautions were reviewed today.  She was  advised to continue to monitor her blood pressures at home  and to call should her blood pressures be in the severe  range.  She will return in 1 week for another BPP. ----------------------------------------------------------------------                   Ma Rings, MD Electronically Signed Final Report   02/13/2023 04:33 pm  ----------------------------------------------------------------------   [place] MFM OB FOLLOW UP  Result Date: 01/15/2023 ----------------------------------------------------------------------  OBSTETRICS REPORT                       (Signed Final 01/15/2023 04:24 pm) ---------------------------------------------------------------------- Patient Info  ID #:       [id number]                          D.O.B.:  11-17-1990 (32 yrs)  Name:       Kimberly Stark                   Visit Date: 01/15/2023 03:35 pm ---------------------------------------------------------------------- Performed By  Attending:        Noralee Space MD        Ref. Address:     [address]                                                             [place], [place]  [address]  Performed By:     Marcellina Millin       [facility]                                     [facility]  Referred By:      [facility] ---------------------------------------------------------------------- Orders  #  Description                           Code        Ordered By  1  [place] MFM OB FOLLOW UP                   [phone]    Braxton Feathers ----------------------------------------------------------------------  #  Order #                     Accession #                Episode #  1  [id number]                   [id number]                 [id number] ---------------------------------------------------------------------- Indications  Hypertension - Chronic/Pre-existing            O10.019  (Labetalol)  Gestational diabetes in pregnancy, diet        O24.410  controlled  Medical complication of pregnancy (history     O26.90  of PE, Arnold-Chiari malformation, asthma,  Bipolar disease)  Obesity complicating  pregnancy, third          O99.213  trimester  Cystic Fibrosis (CF) Carrier, third trimester  O09.893  Anemia during pregnancy in third trimester     O99.013  Poor obstetric history-Recurrent (habitual     O26.20  abortion, previous LEEP x 2, previous  HELLP, previous C/S x 2)  Poor obstetric history: Previous preterm       O09.219  delivery, antepartum (30 weeks)  History of cervical cerclage, currently        O09.299  pregnant  Tobacco use complicating pregnancy, third      O99.333  trimester  Encounter for other antenatal screening        Z36.2  follow-up  [redacted] weeks gestation of pregnancy  Z3A.28 ---------------------------------------------------------------------- Vital Signs  BP:          145/66 ---------------------------------------------------------------------- Fetal Evaluation  Num Of Fetuses:         1  Fetal Heart Rate(bpm):  145  Cardiac Activity:       Observed  Presentation:           Breech  Placenta:               Anterior  P. Cord Insertion:      Previously seen  Amniotic Fluid  AFI FV:      Within normal limits  AFI Sum(cm)     %Tile       Largest Pocket(cm)  17.81           67          6.26  RUQ(cm)       RLQ(cm)       LUQ(cm)        LLQ(cm)  6.26          3.23          4.9            3.42 ---------------------------------------------------------------------- Biometry  BPD:        69  mm     G. Age:  27w 5d         12  %    CI:        70.77   %    70 - 86                                                          FL/HC:      20.2   %    19.6 - 20.8  HC:      261.4  mm     G. Age:  28w 3d         13  %    HC/AC:      1.09        0.99 - 1.21  AC:      239.9  mm     G. Age:  28w 2d         30  %    FL/BPD:     76.7   %    71 - 87  FL:       52.9  mm     G. Age:  28w 1d         20  %    FL/AC:      22.1   %    20 - 24  Est. FW:    1190  gm    2 lb 10 oz      20  % ---------------------------------------------------------------------- OB History  Blood Type:   O+  Gravidity:    12         Term:   2        Prem:   1        SAB:   7  TOP:          1        Living:  3 ---------------------------------------------------------------------- Gestational Age  U/S Today:     28w 1d  EDD:   04/08/23  Best:          28w 5d     Det. By:  U/S C R L  (09/21/22)    EDD:   04/04/23 ---------------------------------------------------------------------- Targeted Anatomy  Central Nervous System  Calvarium/Cranial V.:  Appears normal         Cereb./Vermis:          Previously seen  Cavum:                 Previously seen        [place] [employer]:         Previously seen  Lateral Ventricles:    Previously seen        Midline Falx:           Previously seen  Choroid Plexus:        Previously seen  Spine  Cervical:              Previously seen        Sacral:                 Previously seen  Thoracic:              Previously seen        Shape/Curvature:        Previously seen  Lumbar:                Previously seen  Head/Neck  Lips:                  Previously seen        Profile:                Previously seen  Neck:                  Previously seen        Orbits/Eyes:            Previously seen  Nuchal Fold:           Previously seen        Mandible:               Previously seen  Nasal Bone:            Present                Maxilla:                Previously seen  Thorax  4 Chamber View:        Previously seen        Interventr. Septum:     Previously seen  Cardiac Rhythm:        Normal                 Cardiac Axis:           Normal  Cardiac Situs:         Previously seen        Diaphragm:              Appears normal  Rt Outflow Tract:      Previously seen        3 Vessel View:          Previously seen  Lt Outflow Tract:      Previously seen        3 V Trachea View:       Previously seen  Aortic Arch:  Appears normal         IVC:                    Previously seen  Ductal Arch:           Previously seen        Crossing:               Previously seen  SVC:                    Previously seen  Abdomen  Ventral Wall:          Previously seen        Lt Kidney:              Appears normal  Cord Insertion:        Previously seen        Rt Kidney:              Appears normal  Situs:                 Previously seen        Bladder:                Appears normal  Stomach:               Appears normal  Extremities  Lt Humerus:            Previously seen        Lt Femur:               Previously seen  Rt Humerus:            Previously seen        Rt Femur:               Previously seen  Lt Forearm:            Previously seen        Lt Lower Leg:           Previously seen  Rt Forearm:            Previously seen        Rt Lower Leg:           Previously seen  Lt Hand:               Previously seen        Lt Foot:                Previously seen  Rt Hand:               Previously seen        Rt Foot:                Previously seen  Other  Umbilical Cord:        Previously seen        Genitalia:              Female prev seen  Comment:     Fetal anatomic survey complete. ---------------------------------------------------------------------- Impression  Chronic pretension.  Patient takes labetalol.  Newly diagnosed gestational diabetes.  Patient will be  meeting with our diabetic educator.  History of pulmonary embolism.  Patient is on Lovenox.  Patient had a death in the family.  Her mother recently  passed away.  Fetal growth is appropriate for gestational age.  Amniotic fluid  normal good fetal activity seen.  I reassured  the patient of normal fetal growth assessment. ---------------------------------------------------------------------- Recommendations  - Appointment was made for her to return in 4 weeks for fetal  growth assessment and BPP.  -Weekly BPP from [redacted] weeks gestation till delivery. ----------------------------------------------------------------------                 Noralee Space, MD Electronically Signed Final Report   01/15/2023 04:24 pm  ----------------------------------------------------------------------    Assessment and Plan:  Pregnancy: [id number] at [redacted]w[redacted]d 1. Chronic hypertension with superimposed preeclampsia 2. History of HELLP syndrome, currently pregnant, third trimester Stable recheck.  Increased Labetalol to 600 mg po tid for now.  She was offered admission/observation, she is trying to avoid this for now but knows this is recommended.  Discussed severe preeclampsia precautions, she agreed to go to hospital if headache becomes severe again or for any other features.   Will check labs today, continue antenatal testing and scans as per MFM.  High likelihood of needing admission/delivery soon, this was discussed with patient. - Comprehensive metabolic panel - CBC - Protein / creatinine ratio, urine - labetalol (NORMODYNE) 300 MG tablet; Take 2 tablets (600 mg total) by mouth 3 (three) times daily.  Dispense: 180 tablet; Refill: 2   3. Diet controlled gestational diabetes mellitus (GDM) in third trimester Stable BS, continue diet.   4. History of pulmonary embolus (PE) On prophylactic Lovenox.  5. Arnold-Chiari malformation (HCC) No concerns about regional anesthesia.  6. History of preterm delivery, currently pregnant in third trimester Iatrogenic at 30 and 34 weeks in setting of HELLP/PreE   7. [redacted] weeks gestation of pregnancy 8. Supervision of high risk pregnancy, antepartum Preterm labor symptoms and general obstetric precautions including but not limited to vaginal bleeding, contractions, leaking of fluid and fetal movement were reviewed in detail with the patient. Please refer to After Visit Summary for other counseling recommendations.   Return in about 2 weeks (around 02/28/2023) for OFFICE OB VISIT (MD only).  Future Appointments  Date Time Provider Department Center  02/19/2023  3:15 PM [facility] NURSE [facility]  02/19/2023  3:30 PM [facility] US4 [facility]  02/26/2023  3:15 PM [facility] NURSE [facility]  02/26/2023  3:30 PM [facility] US4 [facility]  02/28/2023 10:15 AM Rafal Archuleta, Jethro Bastos, MD [facility]  03/05/2023  3:15 PM [facility] NURSE [facility]  03/05/2023  3:30 PM [facility] US4 [facility]  03/12/2023  3:15 PM [facility] NURSE [facility]  03/12/2023  3:30 PM [facility] US4 [facility]  04/05/2023  8:00 AM Tobb, Lavona Mound, DO [facility] None    Jaynie Collins, MD

## 2023-02-14 NOTE — MAU Note (Signed)
.  Kimberly Stark is a 32 y.o. at [redacted]w[redacted]d here in MAU reporting: She was in office today with elevated b/p and a headache. Dr. Macon Large wanted to admit her she did not want to be admitted. Plan was for her to got home take 600mg  of labetalol at 1120 today and lay down to see if headache went away.  Headache did not go away and pe is here in MAU now stated she would be ok to be admitted. Notified Dr. Berton Lan and told to do work up in MAU. Denies any vag bleeding or leaking or ctx. Good fetal movement reported.  LMP:  Onset of complaint: today Pain score: 6 Vitals:   02/14/23 1304  BP: (!) 157/84  Pulse: 94  Resp: 18     FHT:135 Lab orders placed from triage:

## 2023-02-14 NOTE — MAU Note (Signed)
Dr Berton Lan at bedside

## 2023-02-15 DIAGNOSIS — O24414 Gestational diabetes mellitus in pregnancy, insulin controlled: Secondary | ICD-10-CM

## 2023-02-15 DIAGNOSIS — O119 Pre-existing hypertension with pre-eclampsia, unspecified trimester: Secondary | ICD-10-CM

## 2023-02-15 DIAGNOSIS — Z3A33 33 weeks gestation of pregnancy: Secondary | ICD-10-CM

## 2023-02-15 LAB — CBC
HCT: 31.5 % — ABNORMAL LOW (ref 36.0–46.0)
Hematocrit: 31.9 % — ABNORMAL LOW (ref 34.0–46.6)
Hemoglobin: 10.2 g/dL — ABNORMAL LOW (ref 11.1–15.9)
Hemoglobin: 9.8 g/dL — ABNORMAL LOW (ref 12.0–15.0)
MCH: 25.7 pg — ABNORMAL LOW (ref 26.6–33.0)
MCH: 24.8 pg — ABNORMAL LOW (ref 26.0–34.0)
MCHC: 32 g/dL (ref 31.5–35.7)
MCHC: 31.1 g/dL (ref 30.0–36.0)
MCV: 80 fL (ref 79–97)
MCV: 79.7 fL — ABNORMAL LOW (ref 80.0–100.0)
NRBC: 3 % — ABNORMAL HIGH (ref 0–0)
Platelets: 392 10*3/uL (ref 150–450)
Platelets: 365 10*3/uL (ref 150–400)
RBC: 3.97 x10E6/uL (ref 3.77–5.28)
RBC: 3.95 MIL/uL (ref 3.87–5.11)
RDW: 14.7 % (ref 11.7–15.4)
RDW: 15 % (ref 11.5–15.5)
WBC: 7.3 10*3/uL (ref 3.4–10.8)
WBC: 9.8 10*3/uL (ref 4.0–10.5)
nRBC: 2.5 % — ABNORMAL HIGH (ref 0.0–0.2)

## 2023-02-15 LAB — COMPREHENSIVE METABOLIC PANEL
ALT: 8 [IU]/L (ref 0–32)
ALT: 11 U/L (ref 0–44)
AST: 10 [IU]/L (ref 0–40)
AST: 15 U/L (ref 15–41)
Albumin: 3.3 g/dL — ABNORMAL LOW (ref 3.9–4.9)
Albumin: 2.5 g/dL — ABNORMAL LOW (ref 3.5–5.0)
Alkaline Phosphatase: 191 [IU]/L — ABNORMAL HIGH (ref 44–121)
Alkaline Phosphatase: 153 U/L — ABNORMAL HIGH (ref 38–126)
Anion gap: 9 (ref 5–15)
BUN/Creatinine Ratio: 4 — ABNORMAL LOW (ref 9–23)
BUN: 2 mg/dL — ABNORMAL LOW (ref 6–20)
BUN: 5 mg/dL — ABNORMAL LOW (ref 6–20)
Bilirubin Total: 0.2 mg/dL (ref 0.0–1.2)
CO2: 19 mmol/L — ABNORMAL LOW (ref 20–29)
CO2: 18 mmol/L — ABNORMAL LOW (ref 22–32)
Calcium: 9.5 mg/dL (ref 8.7–10.2)
Calcium: 9.6 mg/dL (ref 8.9–10.3)
Chloride: 102 mmol/L (ref 96–106)
Chloride: 106 mmol/L (ref 98–111)
Creatinine, Ser: 0.49 mg/dL — ABNORMAL LOW (ref 0.57–1.00)
Creatinine, Ser: 0.56 mg/dL (ref 0.44–1.00)
GFR, Estimated: >60 mL/min (ref >60)
Globulin, Total: 2.1 g/dL (ref 1.5–4.5)
Glucose, Bld: 198 mg/dL — ABNORMAL HIGH (ref 70–99)
Glucose: 212 mg/dL — ABNORMAL HIGH (ref 70–99)
Potassium: 3.9 mmol/L (ref 3.5–5.2)
Potassium: 3.5 mmol/L (ref 3.5–5.1)
Sodium: 134 mmol/L (ref 134–144)
Sodium: 133 mmol/L — ABNORMAL LOW (ref 135–145)
Total Bilirubin: 0.4 mg/dL (ref <1.2)
Total Protein: 5.4 g/dL — ABNORMAL LOW (ref 6.0–8.5)
Total Protein: 5.8 g/dL — ABNORMAL LOW (ref 6.5–8.1)
eGFR: 127 mL/min/{1.73_m2} (ref >59)

## 2023-02-15 LAB — GLUCOSE, CAPILLARY
Glucose-Capillary: 116 mg/dL — ABNORMAL HIGH (ref 70–99)
Glucose-Capillary: 131 mg/dL — ABNORMAL HIGH (ref 70–99)
Glucose-Capillary: 157 mg/dL — ABNORMAL HIGH (ref 70–99)
Glucose-Capillary: 158 mg/dL — ABNORMAL HIGH (ref 70–99)
Glucose-Capillary: 159 mg/dL — ABNORMAL HIGH (ref 70–99)
Glucose-Capillary: 171 mg/dL — ABNORMAL HIGH (ref 70–99)
Glucose-Capillary: 173 mg/dL — ABNORMAL HIGH (ref 70–99)
Glucose-Capillary: 176 mg/dL — ABNORMAL HIGH (ref 70–99)
Glucose-Capillary: 179 mg/dL — ABNORMAL HIGH (ref 70–99)
Glucose-Capillary: 184 mg/dL — ABNORMAL HIGH (ref 70–99)
Glucose-Capillary: 184 mg/dL — ABNORMAL HIGH (ref 70–99)
Glucose-Capillary: 190 mg/dL — ABNORMAL HIGH (ref 70–99)
Glucose-Capillary: 198 mg/dL — ABNORMAL HIGH (ref 70–99)
Glucose-Capillary: 198 mg/dL — ABNORMAL HIGH (ref 70–99)
Glucose-Capillary: 200 mg/dL — ABNORMAL HIGH (ref 70–99)
Glucose-Capillary: 211 mg/dL — ABNORMAL HIGH (ref 70–99)
Glucose-Capillary: 214 mg/dL — ABNORMAL HIGH (ref 70–99)
Glucose-Capillary: 226 mg/dL — ABNORMAL HIGH (ref 70–99)
Glucose-Capillary: 234 mg/dL — ABNORMAL HIGH (ref 70–99)
Glucose-Capillary: 235 mg/dL — ABNORMAL HIGH (ref 70–99)
Glucose-Capillary: 252 mg/dL — ABNORMAL HIGH (ref 70–99)
Glucose-Capillary: 283 mg/dL — ABNORMAL HIGH (ref 70–99)

## 2023-02-15 MED ORDER — HYDRALAZINE HCL 20 MG/ML IJ SOLN: 10.0000 mg | INTRAMUSCULAR | Status: DC | PRN

## 2023-02-15 MED ORDER — METFORMIN HCL 500 MG PO TABS
500.0000 mg | ORAL_TABLET | Freq: Two times a day (BID) | ORAL | Status: DC
  Administered 2023-02-15: 500 mg via ORAL
  Filled 2023-02-15 (×2): qty 1

## 2023-02-15 MED ORDER — HYDROXYZINE HCL 50 MG PO TABS: 50.0000 mg | ORAL_TABLET | Freq: Three times a day (TID) | ORAL | Status: DC | PRN

## 2023-02-15 MED ORDER — MAGNESIUM SULFATE 40 GM/1000ML IV SOLN
2.0000 g/h | INTRAVENOUS | Status: DC
  Administered 2023-02-15 – 2023-02-16 (×2): 2 g/h via INTRAVENOUS
  Filled 2023-02-15: qty 1000

## 2023-02-15 MED ORDER — LABETALOL HCL 5 MG/ML IV SOLN: 20.0000 mg | INTRAVENOUS | Status: DC | PRN

## 2023-02-15 MED ORDER — OXYCODONE HCL 5 MG PO TABS
5.0000 mg | ORAL_TABLET | ORAL | Status: DC | PRN
  Administered 2023-02-15 (×2): 5 mg via ORAL
  Filled 2023-02-15 (×2): qty 1

## 2023-02-15 MED ORDER — SALINE SPRAY 0.65 % NA SOLN
1.0000 | NASAL | Status: DC | PRN
  Administered 2023-02-15: 1 via NASAL
  Filled 2023-02-15: qty 44

## 2023-02-15 MED ORDER — BETAMETHASONE SOD PHOS & ACET 6 (3-3) MG/ML IJ SUSP
12.0000 mg | INTRAMUSCULAR | Status: AC
  Administered 2023-02-15 – 2023-02-16 (×2): 12 mg via INTRAMUSCULAR
  Filled 2023-02-15: qty 5

## 2023-02-15 MED ORDER — INSULIN NPH (HUMAN) (ISOPHANE) 100 UNIT/ML ~~LOC~~ SUSP
6.0000 [IU] | Freq: Two times a day (BID) | SUBCUTANEOUS | Status: DC
  Administered 2023-02-15: 6 [IU] via SUBCUTANEOUS
  Filled 2023-02-15: qty 10

## 2023-02-15 MED ORDER — MAGNESIUM SULFATE BOLUS VIA INFUSION
4.0000 g | Freq: Once | INTRAVENOUS | Status: AC
  Administered 2023-02-15: 4 g via INTRAVENOUS
  Filled 2023-02-15: qty 1000

## 2023-02-15 MED ORDER — OXYCODONE HCL 5 MG PO TABS: 10.0000 mg | ORAL_TABLET | ORAL | Status: DC | PRN

## 2023-02-15 MED ORDER — LACTATED RINGERS IV SOLN: INTRAVENOUS | Status: DC

## 2023-02-15 MED ORDER — INSULIN ASPART 100 UNIT/ML IJ SOLN
0.0000 [IU] | Freq: Three times a day (TID) | INTRAMUSCULAR | Status: DC
  Administered 2023-02-15: 3 [IU] via SUBCUTANEOUS

## 2023-02-15 MED ORDER — HYDRALAZINE HCL 20 MG/ML IJ SOLN: 5.0000 mg | INTRAMUSCULAR | Status: DC | PRN

## 2023-02-15 MED ORDER — LABETALOL HCL 5 MG/ML IV SOLN: 40.0000 mg | INTRAVENOUS | Status: DC | PRN

## 2023-02-15 MED ORDER — DEXTROSE IN LACTATED RINGERS 5 % IV SOLN: INTRAVENOUS | Status: DC

## 2023-02-15 NOTE — Inpatient Diabetes Management (Signed)
Inpatient Diabetes Program Recommendations  AACE/ADA: New Consensus Statement on Inpatient Glycemic Control (2015)  Target Ranges:  Prepandial:   less than 140 mg/dL      Peak postprandial:   less than 180 mg/dL (1-2 hours)      Critically ill patients:  140 - 180 mg/dL   Lab Results  Component Value Date   GLUCAP 157 (H) 02/15/2023   HGBA1C 5.8 (H) 09/22/2022    Review of Glycemic Control  Diabetes history: GDM Outpatient Diabetes medications: None Current orders for Inpatient glycemic control: IV insulin  Received referral for Endotool/Peripartum DM management.  Agree with Endotool at this point.  Will follow.    Thank you, Dulce Sellar, MSN, CDCES Diabetes Coordinator Inpatient Diabetes Program [phone] (team pager from 8a-5p)

## 2023-02-15 NOTE — Progress Notes (Signed)
FACULTY PRACTICE ANTEPARTUM(COMPREHENSIVE) NOTE  Kimberly Stark is a 32 y.o. [id number] with Estimated Date of Delivery: 04/04/23   By  early ultrasound [redacted]w[redacted]d  who is admitted for cHTN with superimposed preeclampsia.    Fetal presentation is cephalic. Length of Stay:  1  Days  Date of admission:02/14/2023  Subjective: Patient resting comfortably in bed.  She notes that she is still having a mild frontal headache, but states that it is better than it was yesterday.  She currently rates her pain a 2/10.  Denies blurry vision, denies RUQ pain.  Reports no acute issues overnight  Patient reports the fetal movement as active. Patient reports uterine contraction  activity as none. Patient reports  vaginal bleeding as none. Patient describes fluid per vagina as None.  Vitals:  Blood pressure 139/68, pulse 80, temperature 98.7 F (37.1 C), temperature source Oral, resp. rate 18, SpO2 100%, currently breastfeeding. Vitals:   02/14/23 1937 02/15/23 0002 02/15/23 0431 02/15/23 0810  BP: (!) 151/64 (!) 153/69 (!) 141/75 139/68  Pulse: 91 88 80 80  Resp: 16 16 19 18   Temp: 98.2 F (36.8 C) 98.6 F (37 C) 98.1 F (36.7 C) 98.7 F (37.1 C)  TempSrc: Oral Oral Oral Oral  SpO2:    100%   Physical Examination:  General appearance - alert, well appearing, and in no distress Mental status - normal mood, behavior, speech, dress, motor activity, and thought processes Chest - CTAB Heart - normal rate and regular rhythm Abdomen - gravid, soft and non-tender Extremities - no edema, no calf tenderness.  No clonus  Skin: warm an ddry  Fetal Monitoring:  Baseline: 135 bpm, Variability: moderate variability, Accelerations: +15x15 x 2, and Decelerations: Absent   reactive  Labs:  Results for orders placed or performed during the hospital encounter of 02/14/23 (from the past 24 hour(s))  Urinalysis, Routine w reflex microscopic -Urine, Clean Catch   Collection Time: 02/14/23  1:32 PM  Result Value Ref Range    Color, Urine YELLOW YELLOW   APPearance HAZY (A) CLEAR   Specific Gravity, Urine 1.020 1.005 - 1.030   pH 6.0 5.0 - 8.0   Glucose, UA >=500 (A) NEGATIVE mg/dL   Hgb urine dipstick NEGATIVE NEGATIVE   Bilirubin Urine NEGATIVE NEGATIVE   Ketones, ur 5 (A) NEGATIVE mg/dL   Protein, ur 629 (A) NEGATIVE mg/dL   Nitrite NEGATIVE NEGATIVE   Leukocytes,Ua NEGATIVE NEGATIVE   RBC / HPF 6-10 0 - 5 RBC/hpf   WBC, UA 6-10 0 - 5 WBC/hpf   Bacteria, UA FEW (A) NONE SEEN   Squamous Epithelial / HPF 0-5 0 - 5 /HPF   Mucus PRESENT    Budding Yeast PRESENT   Comprehensive metabolic panel   Collection Time: 02/14/23  1:45 PM  Result Value Ref Range   Sodium 132 (L) 135 - 145 mmol/L   Potassium 3.6 3.5 - 5.1 mmol/L   Chloride 105 98 - 111 mmol/L   CO2 17 (L) 22 - 32 mmol/L   Glucose, Bld 161 (H) 70 - 99 mg/dL   BUN <5 (L) 6 - 20 mg/dL   Creatinine, Ser 5.28 (L) 0.44 - 1.00 mg/dL   Calcium 9.3 8.9 - 41.3 mg/dL   Total Protein 5.8 (L) 6.5 - 8.1 g/dL   Albumin 2.5 (L) 3.5 - 5.0 g/dL   AST 19 15 - 41 U/L   ALT 10 0 - 44 U/L   Alkaline Phosphatase 137 (H) 38 - 126 U/L   Total Bilirubin  0.6 <1.2 mg/dL   GFR, Estimated >29 >52 mL/min   Anion gap 10 5 - 15  CBC   Collection Time: 02/14/23  1:45 PM  Result Value Ref Range   WBC 7.6 4.0 - 10.5 K/uL   RBC 4.04 3.87 - 5.11 MIL/uL   Hemoglobin 10.0 (L) 12.0 - 15.0 g/dL   HCT 84.1 (L) 32.4 - 40.1 %   MCV 81.2 80.0 - 100.0 fL   MCH 24.8 (L) 26.0 - 34.0 pg   MCHC 30.5 30.0 - 36.0 g/dL   RDW 02.7 25.3 - 66.4 %   Platelets 364 150 - 400 K/uL   nRBC 2.2 (H) 0.0 - 0.2 %  Type and screen [facility]   Collection Time: 02/14/23  1:45 PM  Result Value Ref Range   ABO/RH(D) O POS    Antibody Screen NEG    Sample Expiration      02/17/2023,2359 Performed at [facility], [address], [place], [place] [address]    Unit Number [id number]    Blood Component Type RED CELLS,LR    Unit division 00    Status of Unit ALLOCATED     Donor AG Type NEGATIVE FOR E ANTIGEN    Transfusion Status OK TO TRANSFUSE    Crossmatch Result COMPATIBLE    Unit Number [id number]    Blood Component Type RED CELLS,LR    Unit division 00    Status of Unit ALLOCATED    Donor AG Type NEGATIVE FOR E ANTIGEN    Transfusion Status OK TO TRANSFUSE    Crossmatch Result COMPATIBLE   BPAM RBC   Collection Time: 02/14/23  1:45 PM  Result Value Ref Range   Blood Product Unit Number [id number]    PRODUCT CODE E0382V00    Unit Type and Rh 5100    Blood Product Expiration Date 732202542706    Blood Product Unit Number [id number]    PRODUCT CODE E0382V00    Unit Type and Rh 5100    Blood Product Expiration Date [id number]   Glucose, capillary   Collection Time: 02/14/23  7:49 PM  Result Value Ref Range   Glucose-Capillary 253 (H) 70 - 99 mg/dL  Glucose, capillary   Collection Time: 02/14/23  8:23 PM  Result Value Ref Range   Glucose-Capillary 290 (H) 70 - 99 mg/dL  Glucose, capillary   Collection Time: 02/14/23  9:39 PM  Result Value Ref Range   Glucose-Capillary 336 (H) 70 - 99 mg/dL  Glucose, capillary   Collection Time: 02/14/23 10:21 PM  Result Value Ref Range   Glucose-Capillary 362 (H) 70 - 99 mg/dL  Glucose, capillary   Collection Time: 02/14/23 10:52 PM  Result Value Ref Range   Glucose-Capillary 315 (H) 70 - 99 mg/dL  Glucose, capillary   Collection Time: 02/14/23 11:29 PM  Result Value Ref Range   Glucose-Capillary 315 (H) 70 - 99 mg/dL  Glucose, capillary   Collection Time: 02/14/23 11:59 PM  Result Value Ref Range   Glucose-Capillary 283 (H) 70 - 99 mg/dL  Glucose, capillary   Collection Time: 02/15/23 12:32 AM  Result Value Ref Range   Glucose-Capillary 252 (H) 70 - 99 mg/dL  Glucose, capillary   Collection Time: 02/15/23  1:01 AM  Result Value Ref Range   Glucose-Capillary 234 (H) 70 - 99 mg/dL  Glucose, capillary   Collection Time: 02/15/23  1:30 AM  Result Value Ref Range    Glucose-Capillary 198 (H) 70 - 99 mg/dL  Glucose, capillary   Collection Time: 02/15/23  2:28 AM  Result Value Ref Range   Glucose-Capillary 200 (H) 70 - 99 mg/dL  Glucose, capillary   Collection Time: 02/15/23  3:26 AM  Result Value Ref Range   Glucose-Capillary 179 (H) 70 - 99 mg/dL  Glucose, capillary   Collection Time: 02/15/23  4:28 AM  Result Value Ref Range   Glucose-Capillary 173 (H) 70 - 99 mg/dL  Glucose, capillary   Collection Time: 02/15/23  5:28 AM  Result Value Ref Range   Glucose-Capillary 171 (H) 70 - 99 mg/dL  Glucose, capillary   Collection Time: 02/15/23  6:29 AM  Result Value Ref Range   Glucose-Capillary 158 (H) 70 - 99 mg/dL  Glucose, capillary   Collection Time: 02/15/23  7:27 AM  Result Value Ref Range   Glucose-Capillary 157 (H) 70 - 99 mg/dL  Glucose, capillary   Collection Time: 02/15/23  8:34 AM  Result Value Ref Range   Glucose-Capillary 131 (H) 70 - 99 mg/dL  Glucose, capillary   Collection Time: 02/15/23  9:42 AM  Result Value Ref Range   Glucose-Capillary 116 (H) 70 - 99 mg/dL  Glucose, capillary   Collection Time: 02/15/23 10:15 AM  Result Value Ref Range   Glucose-Capillary 159 (H) 70 - 99 mg/dL  Results for orders placed or performed in visit on 02/14/23 (from the past 24 hour(s))  Comprehensive metabolic panel   Collection Time: 02/14/23 11:05 AM  Result Value Ref Range   Glucose 212 (H) 70 - 99 mg/dL   BUN 2 (L) 6 - 20 mg/dL   Creatinine, Ser 1.61 (L) 0.57 - 1.00 mg/dL   eGFR 096 >04 VW/UJW/1.19   BUN/Creatinine Ratio 4 (L) 9 - 23   Sodium 134 134 - 144 mmol/L   Potassium 3.9 3.5 - 5.2 mmol/L   Chloride 102 96 - 106 mmol/L   CO2 19 (L) 20 - 29 mmol/L   Calcium 9.5 8.7 - 10.2 mg/dL   Total Protein 5.4 (L) 6.0 - 8.5 g/dL   Albumin 3.3 (L) 3.9 - 4.9 g/dL   Globulin, Total 2.1 1.5 - 4.5 g/dL   Bilirubin Total 0.2 0.0 - 1.2 mg/dL   Alkaline Phosphatase 191 (H) 44 - 121 IU/L   AST 10 0 - 40 IU/L   ALT 8 0 - 32 IU/L  CBC    Collection Time: 02/14/23 11:05 AM  Result Value Ref Range   WBC 7.3 3.4 - 10.8 x10E3/uL   RBC 3.97 3.77 - 5.28 x10E6/uL   Hemoglobin 10.2 (L) 11.1 - 15.9 g/dL   Hematocrit 14.7 (L) 82.9 - 46.6 %   MCV 80 79 - 97 fL   MCH 25.7 (L) 26.6 - 33.0 pg   MCHC 32.0 31.5 - 35.7 g/dL   RDW 56.2 13.0 - 86.5 %   Platelets 392 150 - 450 x10E3/uL   NRBC 3 (H) 0 - 0 %    Imaging Studies:    [place] MFM FETAL BPP WO NON STRESS  Result Date: 02/13/2023 ----------------------------------------------------------------------  OBSTETRICS REPORT                       (Signed Final 02/13/2023 04:33 pm) ---------------------------------------------------------------------- Patient Info  ID #:       [id number]                          D.O.B.:  1990/07/10 (32 yrs)(F)  Name:  Kimberly Stark                   Visit Date: 02/13/2023 03:21 pm ---------------------------------------------------------------------- Performed By  Attending:        Ma Rings MD         Ref. Address:     [address]                                                             [place], [place]                                                             [address]  Performed By:     Isac Sarna        [facility]                                  [facility]  Referred By:      [facility] ---------------------------------------------------------------------- Orders  #  Description                           Code        Ordered By  1  [place] MFM FETAL BPP WO NON               76819.01    RAVI SHANKAR     STRESS  2  [place] MFM OB FOLLOW UP                   81191.47    RAVI [facility] ----------------------------------------------------------------------  #  Order #                     Accession #                Episode #  1  [id number]                    [id number]  [id number]  2  [id number]                   [id number]                 [id number] ---------------------------------------------------------------------- Indications  Hypertension - Chronic/Pre-existing            O10.019  (Labetalol)  Gestational diabetes in pregnancy, diet        O24.410  controlled  Medical complication of pregnancy (history     O26.90  of PE, Arnold-Chiari malformation, asthma,  Bipolar disease)  Obesity complicating pregnancy, third          O99.213  trimester  Cystic Fibrosis (CF) Carrier, third trimester  O09.893  Anemia during pregnancy in third trimester     O99.013  Poor obstetric history-Recurrent (habitual     O26.20  abortion, previous LEEP x 2, previous  HELLP, previous C/S x 2)  Poor obstetric history: Previous preterm       O09.219  delivery, antepartum (30 weeks)  History of cervical cerclage, currently        O09.299  pregnant  Tobacco use complicating pregnancy, third      O99.333  trimester  [redacted] weeks gestation of pregnancy                Z3A.32  Encounter for other antenatal screening        Z36.2  follow-up  Polyhydramnios, third trimester, antepartum    O40.3XX0  condition or complication, unspecified fetus ---------------------------------------------------------------------- Fetal Evaluation  Num Of Fetuses:         1  Fetal Heart Rate(bpm):  149  Cardiac Activity:       Observed  Presentation:           Cephalic  Placenta:               Anterior  P. Cord Insertion:      Previously seen  Amniotic Fluid  AFI FV:      Polyhydramnios  AFI Sum(cm)     %Tile       Largest Pocket(cm)  25.98           96          9.94  RUQ(cm)       RLQ(cm)       LUQ(cm)        LLQ(cm)  9.94          6.89          4.48           4.67 ---------------------------------------------------------------------- Biophysical Evaluation  Amniotic F.V:   Pocket => 2 cm             F. Tone:        Observed  F. Movement:    Observed                   Score:          8/8  F. Breathing:    Observed ---------------------------------------------------------------------- Biometry  BPD:      79.4  mm     G. Age:  31w 6d         17  %    CI:        75.39   %    70 - 86  FL/HC:      20.1   %    19.9 - 21.5  HC:       290   mm     G. Age:  32w 0d        4.1  %    HC/AC:      0.90        0.96 - 1.11  AC:      322.7  mm     G. Age:  36w 1d       > 99  %    FL/BPD:     73.4   %    71 - 87  FL:       58.3  mm     G. Age:  30w 3d        1.9  %    FL/AC:      18.1   %    20 - 24  Est. FW:    2280  gm           5 lb     71  % ---------------------------------------------------------------------- OB History  Blood Type:   O+  Gravidity:    12        Term:   2        Prem:   1        SAB:   7  TOP:          1        Living:  3 ---------------------------------------------------------------------- Gestational Age  U/S Today:     32w 4d                                        EDD:   04/06/23  Best:          32w 6d     Det. By:  U/S C R L  (09/21/22)    EDD:   04/04/23 ---------------------------------------------------------------------- Targeted Anatomy  Central Nervous System  Calvarium/Cranial V.:  Appears normal         Cereb./Vermis:          Previously seen  Cavum:                 Previously seen        Sales executive:         Previously seen  Lateral Ventricles:    Previously seen        Midline Falx:           Previously seen  Choroid Plexus:        Previously seen  Spine  Cervical:              Previously seen        Sacral:                 Previously seen  Thoracic:              Previously seen        Shape/Curvature:        Previously seen  Lumbar:                Previously seen  Head/Neck  Lips:                  Previously seen        Profile:  Previously seen  Neck:                  Previously seen        Orbits/Eyes:            Previously seen  Nuchal Fold:           Previously seen        Mandible:               Previously seen  Nasal  Bone:            Present                Maxilla:                Previously seen  Thorax  4 Chamber View:        Previously seen        Interventr. Septum:     Previously seen  Cardiac Rhythm:        Normal                 Cardiac Axis:           Normal  Cardiac Situs:         Previously seen        Diaphragm:              Appears normal  Rt Outflow Tract:      Previously seen        3 Vessel View:          Previously seen  Lt Outflow Tract:      Previously seen        3 V Trachea View:       Previously seen  Aortic Arch:           Appears normal         IVC:                    Previously seen  Ductal Arch:           Previously seen        Crossing:               Previously seen  SVC:                   Previously seen  Abdomen  Ventral Wall:          Previously seen        Lt Kidney:              Appears normal  Cord Insertion:        Previously seen        Rt Kidney:              Appears normal  Situs:                 Previously seen        Bladder:                Appears normal  Stomach:               Appears normal  Extremities  Lt Humerus:            Previously seen        Lt Femur:               Previously seen  Rt Humerus:  Previously seen        Rt Femur:               Previously seen  Lt Forearm:            Previously seen        Lt Lower Leg:           Previously seen  Rt Forearm:            Previously seen        Rt Lower Leg:           Previously seen  Lt Hand:               Previously seen        Lt Foot:                Previously seen  Rt Hand:               Previously seen        Rt Foot:                Previously seen  Other  Umbilical Cord:        Previously seen        Genitalia:              Female prev seen ---------------------------------------------------------------------- Comments  This patient was seen for a follow up exam due to chronic  hypertension treated with labetalol 300 mg 3 times a day,  history of a prior pulmonary embolus treated with prophylactic  Lovenox, and recently  diagnosed diet-controlled gestational  diabetes.  The patient was evaluated in the MAU 2 days ago due to  elevated blood pressures.  Her PIH labs were all within  normal limits.  However, her P/C ratio was 1.34 indicating that  she is probably developing preeclampsia.  The patient's  blood pressures today were 162/72 and 154/79.  She denies  any signs or symptoms of preeclampsia.  She was informed that the fetal growth appears appropriate  for her gestational age.  Mild polyhydramnios with a total AFI  of 25.98 cm was noted.  A BPP performed today was 8 out of 8.  Due to chronic hypertension with superimposed  preeclampsia, the patient was advised that the goal for her  delivery would be at 34 weeks or greater.  Preeclampsia precautions were reviewed today.  She was  advised to continue to monitor her blood pressures at home  and to call should her blood pressures be in the severe  range.  She will return in 1 week for another BPP. ----------------------------------------------------------------------                   Ma Rings, MD Electronically Signed Final Report   02/13/2023 04:33 pm ----------------------------------------------------------------------   [place] MFM OB FOLLOW UP  Result Date: 02/13/2023 ----------------------------------------------------------------------  OBSTETRICS REPORT                       (Signed Final 02/13/2023 04:33 pm) ---------------------------------------------------------------------- Patient Info  ID #:       [id number]                          D.O.B.:  Dec 23, 1990 (32 yrs)(F)  Name:       Kimberly Stark                   Visit Date: 02/13/2023 03:21 pm ----------------------------------------------------------------------  Performed By  Attending:        Ma Rings MD         Ref. Address:     [address]                                                             [place], [place]                                                             [address]  Performed By:     Isac Sarna         Location[facility]                                  [facility]  Referred By:      [facility] ---------------------------------------------------------------------- Orders  #  Description                           Code        Ordered By  1  [place] MFM FETAL BPP WO NON               76819.01    RAVI SHANKAR     STRESS  2  [place] MFM OB FOLLOW UP                   [id number]    RAVI [facility] ----------------------------------------------------------------------  #  Order #                     Accession #                Episode #  1  [id number]                   [id number]                 [id number]  2  [id number]  [id number]                 [id number] ---------------------------------------------------------------------- Indications  Hypertension - Chronic/Pre-existing            O10.019  (Labetalol)  Gestational diabetes in pregnancy, diet        O24.410  controlled  Medical complication of pregnancy (history     O26.90  of PE, Arnold-Chiari malformation, asthma,  Bipolar disease)  Obesity complicating pregnancy, third          O99.213  trimester  Cystic Fibrosis (CF) Carrier, third trimester  O09.893  Anemia during pregnancy in third trimester     O99.013  Poor obstetric history-Recurrent (habitual     O26.20  abortion, previous LEEP x 2, previous  HELLP, previous C/S x 2)  Poor obstetric history: Previous preterm       O09.219  delivery, antepartum (30 weeks)  History of cervical cerclage, currently        O09.299  pregnant  Tobacco use complicating pregnancy, third      O99.333  trimester  [redacted] weeks gestation of pregnancy                Z3A.32  Encounter for other antenatal screening        Z36.2  follow-up  Polyhydramnios, third trimester, antepartum    O40.3XX0  condition or complication,  unspecified fetus ---------------------------------------------------------------------- Fetal Evaluation  Num Of Fetuses:         1  Fetal Heart Rate(bpm):  149  Cardiac Activity:       Observed  Presentation:           Cephalic  Placenta:               Anterior  P. Cord Insertion:      Previously seen  Amniotic Fluid  AFI FV:      Polyhydramnios  AFI Sum(cm)     %Tile       Largest Pocket(cm)  25.98           96          9.94  RUQ(cm)       RLQ(cm)       LUQ(cm)        LLQ(cm)  9.94          6.89          4.48           4.67 ---------------------------------------------------------------------- Biophysical Evaluation  Amniotic F.V:   Pocket => 2 cm             F. Tone:        Observed  F. Movement:    Observed                   Score:          8/8  F. Breathing:   Observed ---------------------------------------------------------------------- Biometry  BPD:      79.4  mm     G. Age:  31w 6d         17  %    CI:        75.39   %    70 - 86  FL/HC:      20.1   %    19.9 - 21.5  HC:       290   mm     G. Age:  32w 0d        4.1  %    HC/AC:      0.90        0.96 - 1.11  AC:      322.7  mm     G. Age:  36w 1d       > 99  %    FL/BPD:     73.4   %    71 - 87  FL:       58.3  mm     G. Age:  30w 3d        1.9  %    FL/AC:      18.1   %    20 - 24  Est. FW:    2280  gm           5 lb     71  % ---------------------------------------------------------------------- OB History  Blood Type:   O+  Gravidity:    12        Term:   2        Prem:   1        SAB:   7  TOP:          1        Living:  3 ---------------------------------------------------------------------- Gestational Age  U/S Today:     32w 4d                                        EDD:   04/06/23  Best:          32w 6d     Det. By:  U/S C R L  (09/21/22)    EDD:   04/04/23 ---------------------------------------------------------------------- Targeted Anatomy  Central Nervous System  Calvarium/Cranial V.:   Appears normal         Cereb./Vermis:          Previously seen  Cavum:                 Previously seen        Sales executive:         Previously seen  Lateral Ventricles:    Previously seen        Midline Falx:           Previously seen  Choroid Plexus:        Previously seen  Spine  Cervical:              Previously seen        Sacral:                 Previously seen  Thoracic:              Previously seen        Shape/Curvature:        Previously seen  Lumbar:                Previously seen  Head/Neck  Lips:                  Previously seen        Profile:  Previously seen  Neck:                  Previously seen        Orbits/Eyes:            Previously seen  Nuchal Fold:           Previously seen        Mandible:               Previously seen  Nasal Bone:            Present                Maxilla:                Previously seen  Thorax  4 Chamber View:        Previously seen        Interventr. Septum:     Previously seen  Cardiac Rhythm:        Normal                 Cardiac Axis:           Normal  Cardiac Situs:         Previously seen        Diaphragm:              Appears normal  Rt Outflow Tract:      Previously seen        3 Vessel View:          Previously seen  Lt Outflow Tract:      Previously seen        3 V Trachea View:       Previously seen  Aortic Arch:           Appears normal         IVC:                    Previously seen  Ductal Arch:           Previously seen        Crossing:               Previously seen  SVC:                   Previously seen  Abdomen  Ventral Wall:          Previously seen        Lt Kidney:              Appears normal  Cord Insertion:        Previously seen        Rt Kidney:              Appears normal  Situs:                 Previously seen        Bladder:                Appears normal  Stomach:               Appears normal  Extremities  Lt Humerus:            Previously seen        Lt Femur:               Previously seen  Rt Humerus:  Previously seen         Rt Femur:               Previously seen  Lt Forearm:            Previously seen        Lt Lower Leg:           Previously seen  Rt Forearm:            Previously seen        Rt Lower Leg:           Previously seen  Lt Hand:               Previously seen        Lt Foot:                Previously seen  Rt Hand:               Previously seen        Rt Foot:                Previously seen  Other  Umbilical Cord:        Previously seen        Genitalia:              Female prev seen ---------------------------------------------------------------------- Comments  This patient was seen for a follow up exam due to chronic  hypertension treated with labetalol 300 mg 3 times a day,  history of a prior pulmonary embolus treated with prophylactic  Lovenox, and recently diagnosed diet-controlled gestational  diabetes.  The patient was evaluated in the MAU 2 days ago due to  elevated blood pressures.  Her PIH labs were all within  normal limits.  However, her P/C ratio was 1.34 indicating that  she is probably developing preeclampsia.  The patient's  blood pressures today were 162/72 and 154/79.  She denies  any signs or symptoms of preeclampsia.  She was informed that the fetal growth appears appropriate  for her gestational age.  Mild polyhydramnios with a total AFI  of 25.98 cm was noted.  A BPP performed today was 8 out of 8.  Due to chronic hypertension with superimposed  preeclampsia, the patient was advised that the goal for her  delivery would be at 34 weeks or greater.  Preeclampsia precautions were reviewed today.  She was  advised to continue to monitor her blood pressures at home  and to call should her blood pressures be in the severe  range.  She will return in 1 week for another BPP. ----------------------------------------------------------------------                   Ma Rings, MD Electronically Signed Final Report   02/13/2023 04:33 pm  ----------------------------------------------------------------------     Medications:  Scheduled  amLODipine  5 mg Oral Daily   clotrimazole  1 Applicatorful Vaginal QHS   docusate sodium  100 mg Oral Daily   enoxaparin  40 mg Subcutaneous Q24H   labetalol  600 mg Oral Q8H   prenatal multivitamin  1 tablet Oral Q1200   sodium chloride flush  10 mL Intravenous Q12H   I have reviewed the patient's current medications.  ASSESSMENT: [id number] [redacted]w[redacted]d Estimated Date of Delivery: 04/04/23  Patient Active Problem List   Diagnosis Date Noted   Chronic hypertension with superimposed preeclampsia 02/14/2023   GDM (gestational diabetes mellitus) 01/03/2023   Unwanted fertility 10/24/2022   Arnold-Chiari malformation (  HCC) 12/13/2021   History of LEEP (loop electrosurgical excision procedure) of cervix complicating pregnancy in third trimester 11/21/2021   BMI 38.0-38.9,adult 11/21/2021   History of preterm delivery, currently pregnant in third trimester 11/21/2021   Post-traumatic stress disorder, chronic 09/19/2021   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   History of pulmonary embolus (PE) 06/22/2021   Tobacco abuse 06/22/2021   History of 2 cesarean sections 06/22/2021   History of HELLP syndrome, currently pregnant, third trimester 06/09/2021   Supervision of high risk pregnancy, antepartum 06/09/2021   History of cervical cerclage 06/09/2021   Iron deficiency anemia 07/03/2019    PLAN: 1) cHTN with superimposed preeclampsia -currently on Labetalol 600mg  TID and Amlodipine 5mg  daily.  BP currently stable -labs stable on 12/4, plan to complete twice weekly or as clinically indicated -Headache while not completely resolved, has improved since yesterday.  Will continue to monitor at this time. Should headache worsen or persists will reconsider Mag and moving forward towards delivery.  2) GDMA1> GDMA2 -currently on endotool -plan to start metformin twice daily  3) Fetal well being -Cat.  I- reactive NST -plan for BMZ course  -last [place] 12/3- cephalic EFW: 2280g (71%)  4) Prior PE -currently on Lovenox, will need to discontinue 24hr prior to delivery/C-section  5)Prior C-section x 2, plan for repeat C-section with salpingectomy- consent completed 10/22  6) Maternal care -PNV daily -tylenol prn -encourage ambulation as tolerated -SCDs in place  DISP: Continue inpatient monitoring as outlined above.  Should pt note worsening headache, worsening blood pressures or other acute changes of worsening preeclampsia will move towards delivery.    Myna Hidalgo, DO Attending Obstetrician & Gynecologist, [facility] for [employer], [facility]

## 2023-02-15 NOTE — Plan of Care (Signed)
  Problem: Education: Goal: Knowledge of disease or condition will improve Outcome: Progressing Goal: Knowledge of the prescribed therapeutic regimen will improve Outcome: Progressing Goal: Individualized Educational Video(s) Outcome: Progressing   Problem: Clinical Measurements: Goal: Complications related to the disease process, condition or treatment will be avoided or minimized Outcome: Progressing   Problem: Education: Goal: Knowledge of General Education information will improve Description: Including pain rating scale, medication(s)/side effects and non-pharmacologic comfort measures Outcome: Progressing   Problem: Health Behavior/Discharge Planning: Goal: Ability to manage health-related needs will improve Outcome: Progressing   Problem: Clinical Measurements: Goal: Ability to maintain clinical measurements within normal limits will improve Outcome: Progressing Goal: Will remain free from infection Outcome: Progressing Goal: Diagnostic test results will improve Outcome: Progressing Goal: Respiratory complications will improve Outcome: Progressing Goal: Cardiovascular complication will be avoided Outcome: Progressing   Problem: Health Behavior/Discharge Planning: Goal: Ability to manage health-related needs will improve Outcome: Progressing   Problem: Education: Goal: Knowledge of General Education information will improve Description: Including pain rating scale, medication(s)/side effects and non-pharmacologic comfort measures Outcome: Progressing   Problem: Education: Goal: Knowledge of General Education information will improve Description: Including pain rating scale, medication(s)/side effects and non-pharmacologic comfort measures Outcome: Progressing

## 2023-02-15 NOTE — Progress Notes (Signed)
    Faculty Practice OB/GYN Attending Note  Subjective:  Called to evaluate patient with 6/10 headache on top of her head which is not better after Excedrin Tension medicine and Phernergan.  Also reports RUQ pain. No visual symptoms or other concerning neurologic signs.  FHR reassuring, no contractions, no LOF or vaginal bleeding. Good FM.      Objective:  Blood pressure (!) 149/74, pulse 86, temperature 98.5 F (36.9 C), temperature source Oral, resp. rate 19, SpO2 100%, currently breastfeeding. FHT  Baseline 140bpm, moderate variability, +accelerations, no decelerations Toco: q 3-4 mins (not felt by patient) Gen: NAD HENT: Normocephalic, atraumatic Lungs: Normal respiratory effort Heart: Regular rate noted Abdomen: +RUQ tenderness, NT and gravid fundus, soft abdomen Cervix: Deferred Ext: 2+ DTRs, no edema, no cyanosis, negative Homan's sign  Results:    Latest Ref Rng & Units 02/14/2023    1:45 PM 02/14/2023   11:05 AM 02/11/2023    8:08 PM  CMP  Glucose 70 - 99 mg/dL 981  191  478   BUN 6 - 20 mg/dL <5  2  <5   Creatinine 0.44 - 1.00 mg/dL 2.95  6.21  3.08   Sodium 135 - 145 mmol/L 132  134  131   Potassium 3.5 - 5.1 mmol/L 3.6  3.9  3.4   Chloride 98 - 111 mmol/L 105  102  105   CO2 22 - 32 mmol/L 17  19  17    Calcium 8.9 - 10.3 mg/dL 9.3  9.5  9.4   Total Protein 6.5 - 8.1 g/dL 5.8  5.4  5.8   Total Bilirubin <1.2 mg/dL 0.6  0.2  0.4   Alkaline Phos 38 - 126 U/L 137  191  150   AST 15 - 41 U/L 19  10  14    ALT 0 - 44 U/L 10  8  12        Latest Ref Rng & Units 02/14/2023    1:45 PM 02/14/2023   11:05 AM 02/11/2023    8:08 PM  CBC  WBC 4.0 - 10.5 K/uL 7.6  7.3  8.4   Hemoglobin 12.0 - 15.0 g/dL 65.7  84.6  96.2   Hematocrit 36.0 - 46.0 % 32.8  31.9  33.0   Platelets 150 - 400 K/uL 364  392  337     Assessment & Plan:  32 y.o. [id number] at [redacted]w[redacted]d admitted for [facility] with superimposed preeclampsia, concerned about progression to severe preeclampsia given persistent  headache, RUQ pain.  - Will recheck labs, will follow up results and manage accordingly. - Magnesium sulfate started for eclampsia prophylaxis.  Oxycodone ordered to help with pain. If headache worsens or does not improve, may consider imaging - Patient received first dose of betamethasone around 12 today, will give second dose tomorrow morning - NPO ordered for now.  Patient aware that deliver could be indicated if her headache is not controlled, or for any other maternal-fetal concerning indication.  The delivery routine will be repeat cesarean delivery. - If contractions continue and are felt by patient, may need to check cervix.  Will defer for now. - Continue close observation.   Jaynie Collins, MD, FACOG Obstetrician & Gynecologist, [facility] for [employer], [facility]

## 2023-02-15 NOTE — Plan of Care (Signed)
  Problem: Education: Goal: Knowledge of disease or condition will improve Outcome: Progressing Goal: Knowledge of the prescribed therapeutic regimen will improve Outcome: Progressing Goal: Individualized Educational Video(s) Outcome: Progressing   Problem: Clinical Measurements: Goal: Complications related to the disease process, condition or treatment will be avoided or minimized Outcome: Progressing   Problem: Education: Goal: Knowledge of General Education information will improve Description: Including pain rating scale, medication(s)/side effects and non-pharmacologic comfort measures Outcome: Progressing   Problem: Health Behavior/Discharge Planning: Goal: Ability to manage health-related needs will improve Outcome: Progressing   Problem: Clinical Measurements: Goal: Ability to maintain clinical measurements within normal limits will improve Outcome: Progressing Goal: Will remain free from infection Outcome: Progressing Goal: Diagnostic test results will improve Outcome: Progressing Goal: Respiratory complications will improve Outcome: Progressing Goal: Cardiovascular complication will be avoided Outcome: Progressing   Problem: Activity: Goal: Risk for activity intolerance will decrease Outcome: Progressing   Problem: Nutrition: Goal: Adequate nutrition will be maintained Outcome: Progressing   Problem: Coping: Goal: Level of anxiety will decrease Outcome: Progressing   Problem: Elimination: Goal: Will not experience complications related to bowel motility Outcome: Progressing Goal: Will not experience complications related to urinary retention Outcome: Progressing   Problem: Pain Management: Goal: General experience of comfort will improve Outcome: Progressing   Problem: Safety: Goal: Ability to remain free from injury will improve Outcome: Progressing   Problem: Skin Integrity: Goal: Risk for impaired skin integrity will decrease Outcome:  Progressing   Problem: Education: Goal: Ability to describe self-care measures that may prevent or decrease complications (Diabetes Survival Skills Education) will improve Outcome: Progressing   Problem: Coping: Goal: Ability to adjust to condition or change in health will improve Outcome: Progressing   Problem: Fluid Volume: Goal: Ability to maintain a balanced intake and output will improve Outcome: Progressing   Problem: Health Behavior/Discharge Planning: Goal: Ability to identify and utilize available resources and services will improve Outcome: Progressing Goal: Ability to manage health-related needs will improve Outcome: Progressing   Problem: Metabolic: Goal: Ability to maintain appropriate glucose levels will improve Outcome: Progressing   Problem: Nutritional: Goal: Maintenance of adequate nutrition will improve Outcome: Progressing Goal: Progress toward achieving an optimal weight will improve Outcome: Progressing   Problem: Skin Integrity: Goal: Risk for impaired skin integrity will decrease Outcome: Progressing   Problem: Tissue Perfusion: Goal: Adequacy of tissue perfusion will improve Outcome: Progressing

## 2023-02-16 ENCOUNTER — Inpatient Hospital Stay (HOSPITAL_COMMUNITY): Payer: No Typology Code available for payment source | Admitting: Anesthesiology

## 2023-02-16 ENCOUNTER — Encounter (HOSPITAL_COMMUNITY)
Admission: AD | Disposition: A | Discharge status: Home / Self Care | Payer: Self-pay | Source: Home / Self Care | Attending: Family Medicine

## 2023-02-16 ENCOUNTER — Encounter (HOSPITAL_COMMUNITY): Payer: Self-pay | Admitting: Obstetrics and Gynecology

## 2023-02-16 ENCOUNTER — Other Ambulatory Visit: Payer: Self-pay

## 2023-02-16 DIAGNOSIS — O34211 Maternal care for low transverse scar from previous cesarean delivery: Secondary | ICD-10-CM

## 2023-02-16 DIAGNOSIS — Z302 Encounter for sterilization: Secondary | ICD-10-CM

## 2023-02-16 DIAGNOSIS — Z9851 Tubal ligation status: Secondary | ICD-10-CM

## 2023-02-16 DIAGNOSIS — Z3A33 33 weeks gestation of pregnancy: Secondary | ICD-10-CM

## 2023-02-16 DIAGNOSIS — Z98891 History of uterine scar from previous surgery: Principal | ICD-10-CM

## 2023-02-16 DIAGNOSIS — O99344 Other mental disorders complicating childbirth: Secondary | ICD-10-CM

## 2023-02-16 DIAGNOSIS — O1414 Severe pre-eclampsia complicating childbirth: Secondary | ICD-10-CM

## 2023-02-16 DIAGNOSIS — O24420 Gestational diabetes mellitus in childbirth, diet controlled: Secondary | ICD-10-CM

## 2023-02-16 HISTORY — PX: BILATERAL SALPINGECTOMY: SHX5743

## 2023-02-16 HISTORY — PX: TUBAL LIGATION: SHX77

## 2023-02-16 LAB — COMPREHENSIVE METABOLIC PANEL
ALT: 13 U/L (ref 0–44)
AST: 15 U/L (ref 15–41)
Albumin: 2.5 g/dL — ABNORMAL LOW (ref 3.5–5.0)
Alkaline Phosphatase: 149 U/L — ABNORMAL HIGH (ref 38–126)
Anion gap: 9 (ref 5–15)
BUN: <5 mg/dL — ABNORMAL LOW (ref 6–20)
CO2: 18 mmol/L — ABNORMAL LOW (ref 22–32)
Calcium: 9.3 mg/dL (ref 8.9–10.3)
Chloride: 106 mmol/L (ref 98–111)
Creatinine, Ser: 0.58 mg/dL (ref 0.44–1.00)
GFR, Estimated: >60 mL/min (ref >60)
Glucose, Bld: 203 mg/dL — ABNORMAL HIGH (ref 70–99)
Potassium: 3.6 mmol/L (ref 3.5–5.1)
Sodium: 133 mmol/L — ABNORMAL LOW (ref 135–145)
Total Bilirubin: 0.5 mg/dL (ref <1.2)
Total Protein: 5.6 g/dL — ABNORMAL LOW (ref 6.5–8.1)

## 2023-02-16 LAB — CREATININE, SERUM
Creatinine, Ser: 0.57 mg/dL (ref 0.44–1.00)
GFR, Estimated: >60 mL/min (ref >60)

## 2023-02-16 LAB — CBC
HCT: 31 % — ABNORMAL LOW (ref 36.0–46.0)
HCT: 30.6 % — ABNORMAL LOW (ref 36.0–46.0)
Hemoglobin: 9.6 g/dL — ABNORMAL LOW (ref 12.0–15.0)
Hemoglobin: 9.8 g/dL — ABNORMAL LOW (ref 12.0–15.0)
MCH: 24.7 pg — ABNORMAL LOW (ref 26.0–34.0)
MCH: 25.8 pg — ABNORMAL LOW (ref 26.0–34.0)
MCHC: 31 g/dL (ref 30.0–36.0)
MCHC: 32 g/dL (ref 30.0–36.0)
MCV: 79.9 fL — ABNORMAL LOW (ref 80.0–100.0)
MCV: 80.5 fL (ref 80.0–100.0)
Platelets: 393 10*3/uL (ref 150–400)
Platelets: 405 10*3/uL — ABNORMAL HIGH (ref 150–400)
RBC: 3.88 MIL/uL (ref 3.87–5.11)
RBC: 3.8 MIL/uL — ABNORMAL LOW (ref 3.87–5.11)
RDW: 15.2 % (ref 11.5–15.5)
RDW: 15.3 % (ref 11.5–15.5)
WBC: 12.3 10*3/uL — ABNORMAL HIGH (ref 4.0–10.5)
WBC: 16.5 10*3/uL — ABNORMAL HIGH (ref 4.0–10.5)
nRBC: 3.1 % — ABNORMAL HIGH (ref 0.0–0.2)
nRBC: 1.8 % — ABNORMAL HIGH (ref 0.0–0.2)

## 2023-02-16 LAB — RAPID HIV SCREEN (HIV 1/2 AB+AG)
HIV 1/2 Antibodies: NONREACTIVE
HIV-1 P24 Antigen - HIV24: NONREACTIVE

## 2023-02-16 LAB — GLUCOSE, CAPILLARY
Glucose-Capillary: 189 mg/dL — ABNORMAL HIGH (ref 70–99)
Glucose-Capillary: 191 mg/dL — ABNORMAL HIGH (ref 70–99)
Glucose-Capillary: 191 mg/dL — ABNORMAL HIGH (ref 70–99)

## 2023-02-16 LAB — MAGNESIUM: Magnesium: 3.5 mg/dL — ABNORMAL HIGH (ref 1.7–2.4)

## 2023-02-16 SURGERY — Surgical Case
Anesthesia: Spinal

## 2023-02-16 MED ORDER — PHENYLEPHRINE HCL-NACL 20-0.9 MG/250ML-% IV SOLN
INTRAVENOUS | Status: AC
  Filled 2023-02-16: qty 250

## 2023-02-16 MED ORDER — INSULIN NPH (HUMAN) (ISOPHANE) 100 UNIT/ML ~~LOC~~ SUSP
10.0000 [IU] | Freq: Two times a day (BID) | SUBCUTANEOUS | Status: DC
Start: 2023-02-16 — End: 2023-02-16
  Administered 2023-02-16: 10 [IU] via SUBCUTANEOUS

## 2023-02-16 MED ORDER — SIMETHICONE 80 MG PO CHEW: 80.0000 mg | CHEWABLE_TABLET | ORAL | Status: DC | PRN

## 2023-02-16 MED ORDER — MIDAZOLAM HCL 2 MG/2ML IJ SOLN
INTRAMUSCULAR | Status: AC
  Filled 2023-02-16: qty 2

## 2023-02-16 MED ORDER — PHENYLEPHRINE 80 MCG/ML (10ML) SYRINGE FOR IV PUSH (FOR BLOOD PRESSURE SUPPORT)
PREFILLED_SYRINGE | INTRAVENOUS | Status: AC
  Filled 2023-02-16: qty 10

## 2023-02-16 MED ORDER — WITCH HAZEL-GLYCERIN EX PADS: 1.0000 | MEDICATED_PAD | CUTANEOUS | Status: DC | PRN

## 2023-02-16 MED ORDER — BUPIVACAINE IN DEXTROSE 0.75-8.25 % IT SOLN
INTRATHECAL | Status: DC | PRN
  Administered 2023-02-16: 1.5 mL via INTRATHECAL

## 2023-02-16 MED ORDER — OXYTOCIN-SODIUM CHLORIDE 30-0.9 UT/500ML-% IV SOLN
INTRAVENOUS | Status: AC
Start: 2023-02-16 — End: ?
  Filled 2023-02-16: qty 500

## 2023-02-16 MED ORDER — OXYCODONE HCL 5 MG/5ML PO SOLN: 5.0000 mg | Freq: Once | ORAL | Status: DC | PRN

## 2023-02-16 MED ORDER — FENTANYL CITRATE (PF) 100 MCG/2ML IJ SOLN
INTRAMUSCULAR | Status: AC
  Filled 2023-02-16: qty 2

## 2023-02-16 MED ORDER — CEFAZOLIN SODIUM-DEXTROSE 2-4 GM/100ML-% IV SOLN
2.0000 g | INTRAVENOUS | Status: AC
  Administered 2023-02-16: 2 g via INTRAVENOUS

## 2023-02-16 MED ORDER — ACETAMINOPHEN 10 MG/ML IV SOLN
1000.0000 mg | Freq: Once | INTRAVENOUS | Status: DC | PRN
  Administered 2023-02-16: 1000 mg via INTRAVENOUS

## 2023-02-16 MED ORDER — TETANUS-DIPHTH-ACELL PERTUSSIS 5-2.5-18.5 LF-MCG/0.5 IM SUSY
0.5000 mL | PREFILLED_SYRINGE | Freq: Once | INTRAMUSCULAR | Status: AC
  Administered 2023-02-17: 0.5 mL via INTRAMUSCULAR
  Filled 2023-02-16: qty 0.5

## 2023-02-16 MED ORDER — ONDANSETRON HCL 4 MG/2ML IJ SOLN
INTRAMUSCULAR | Status: AC
  Filled 2023-02-16: qty 2

## 2023-02-16 MED ORDER — SOD CITRATE-CITRIC ACID 500-334 MG/5ML PO SOLN: 30.0000 mL | ORAL | Status: DC

## 2023-02-16 MED ORDER — AMLODIPINE BESYLATE 5 MG PO TABS
5.0000 mg | ORAL_TABLET | Freq: Every day | ORAL | Status: DC
  Administered 2023-02-17: 5 mg via ORAL
  Filled 2023-02-16: qty 1

## 2023-02-16 MED ORDER — OXYTOCIN-SODIUM CHLORIDE 30-0.9 UT/500ML-% IV SOLN
INTRAVENOUS | Status: DC | PRN
  Administered 2023-02-16: 30 [IU] via INTRAVENOUS

## 2023-02-16 MED ORDER — SOD CITRATE-CITRIC ACID 500-334 MG/5ML PO SOLN
30.0000 mL | Freq: Once | ORAL | Status: AC
  Administered 2023-02-16: 30 mL via ORAL
  Filled 2023-02-16: qty 30

## 2023-02-16 MED ORDER — LABETALOL HCL 200 MG PO TABS
200.0000 mg | ORAL_TABLET | Freq: Two times a day (BID) | ORAL | Status: DC
  Administered 2023-02-16 – 2023-02-18 (×5): 200 mg via ORAL
  Filled 2023-02-16 (×5): qty 1

## 2023-02-16 MED ORDER — ENOXAPARIN SODIUM 60 MG/0.6ML IJ SOSY
50.0000 mg | PREFILLED_SYRINGE | INTRAMUSCULAR | Status: DC
  Administered 2023-02-16 – 2023-02-19 (×4): 50 mg via SUBCUTANEOUS
  Filled 2023-02-16 (×4): qty 0.6

## 2023-02-16 MED ORDER — COCONUT OIL OIL
1.0000 | TOPICAL_OIL | Status: DC | PRN
  Administered 2023-02-17: 1 via TOPICAL

## 2023-02-16 MED ORDER — DEXAMETHASONE SODIUM PHOSPHATE 10 MG/ML IJ SOLN
INTRAMUSCULAR | Status: DC | PRN
  Administered 2023-02-16: 10 mg via INTRAVENOUS

## 2023-02-16 MED ORDER — LACTATED RINGERS IV SOLN: INTRAVENOUS | Status: DC

## 2023-02-16 MED ORDER — DIPHENHYDRAMINE HCL 50 MG/ML IJ SOLN
12.5000 mg | Freq: Once | INTRAMUSCULAR | Status: AC | PRN
  Administered 2023-02-17: 12.5 mg via INTRAVENOUS
  Filled 2023-02-16: qty 1

## 2023-02-16 MED ORDER — MAGNESIUM SULFATE 40 GM/1000ML IV SOLN
2.0000 g/h | INTRAVENOUS | Status: AC
  Administered 2023-02-16: 2 g/h via INTRAVENOUS
  Filled 2023-02-16: qty 1000

## 2023-02-16 MED ORDER — OXYTOCIN-SODIUM CHLORIDE 30-0.9 UT/500ML-% IV SOLN: 2.5000 [IU]/h | INTRAVENOUS | Status: AC

## 2023-02-16 MED ORDER — ONDANSETRON HCL 4 MG/2ML IJ SOLN
INTRAMUSCULAR | Status: DC | PRN
  Administered 2023-02-16: 4 mg via INTRAVENOUS

## 2023-02-16 MED ORDER — MENTHOL 3 MG MT LOZG: 1.0000 | LOZENGE | OROMUCOSAL | Status: DC | PRN

## 2023-02-16 MED ORDER — DIPHENHYDRAMINE HCL 25 MG PO CAPS
25.0000 mg | ORAL_CAPSULE | Freq: Four times a day (QID) | ORAL | Status: DC | PRN
  Administered 2023-02-16 (×2): 25 mg via ORAL
  Filled 2023-02-16 (×2): qty 1

## 2023-02-16 MED ORDER — FENTANYL CITRATE (PF) 100 MCG/2ML IJ SOLN
25.0000 ug | INTRAMUSCULAR | Status: DC | PRN
Start: 2023-02-16 — End: 2023-02-16
  Administered 2023-02-16 (×3): 50 ug via INTRAVENOUS

## 2023-02-16 MED ORDER — HYDROMORPHONE HCL 1 MG/ML IJ SOLN: 0.2000 mg | INTRAMUSCULAR | Status: DC | PRN

## 2023-02-16 MED ORDER — LACTATED RINGERS IV SOLN: INTRAVENOUS | Status: AC

## 2023-02-16 MED ORDER — TRANEXAMIC ACID-NACL 1000-0.7 MG/100ML-% IV SOLN
1000.0000 mg | INTRAVENOUS | Status: AC
  Administered 2023-02-16: 1000 mg via INTRAVENOUS

## 2023-02-16 MED ORDER — PHENYLEPHRINE HCL-NACL 20-0.9 MG/250ML-% IV SOLN
INTRAVENOUS | Status: DC | PRN
  Administered 2023-02-16: 60 ug/min via INTRAVENOUS

## 2023-02-16 MED ORDER — ONDANSETRON HCL 4 MG/2ML IJ SOLN: 4.0000 mg | Freq: Once | INTRAMUSCULAR | Status: DC | PRN

## 2023-02-16 MED ORDER — ZOLPIDEM TARTRATE 5 MG PO TABS: 5.0000 mg | ORAL_TABLET | Freq: Every evening | ORAL | Status: DC | PRN

## 2023-02-16 MED ORDER — PHENYLEPHRINE 80 MCG/ML (10ML) SYRINGE FOR IV PUSH (FOR BLOOD PRESSURE SUPPORT)
PREFILLED_SYRINGE | INTRAVENOUS | Status: DC | PRN
  Administered 2023-02-16: 80 ug via INTRAVENOUS

## 2023-02-16 MED ORDER — DIBUCAINE (PERIANAL) 1 % EX OINT: 1.0000 | TOPICAL_OINTMENT | CUTANEOUS | Status: DC | PRN

## 2023-02-16 MED ORDER — FAMOTIDINE IN NACL 20-0.9 MG/50ML-% IV SOLN
20.0000 mg | Freq: Two times a day (BID) | INTRAVENOUS | Status: DC
  Administered 2023-02-16: 20 mg via INTRAVENOUS
  Filled 2023-02-16: qty 50

## 2023-02-16 MED ORDER — ACETAMINOPHEN 500 MG PO TABS
1000.0000 mg | ORAL_TABLET | Freq: Four times a day (QID) | ORAL | Status: DC
  Administered 2023-02-16 – 2023-02-20 (×14): 1000 mg via ORAL
  Filled 2023-02-16 (×14): qty 2

## 2023-02-16 MED ORDER — PRENATAL MULTIVITAMIN CH
1.0000 | ORAL_TABLET | Freq: Every day | ORAL | Status: DC
  Administered 2023-02-16 – 2023-02-20 (×5): 1 via ORAL
  Filled 2023-02-16 (×5): qty 1

## 2023-02-16 MED ORDER — OXYCODONE HCL 5 MG PO TABS
5.0000 mg | ORAL_TABLET | ORAL | Status: DC | PRN
  Administered 2023-02-17 – 2023-02-19 (×5): 10 mg via ORAL
  Filled 2023-02-16 (×5): qty 2

## 2023-02-16 MED ORDER — DEXAMETHASONE SODIUM PHOSPHATE 10 MG/ML IJ SOLN
INTRAMUSCULAR | Status: AC
  Filled 2023-02-16: qty 1

## 2023-02-16 MED ORDER — GABAPENTIN 100 MG PO CAPS
100.0000 mg | ORAL_CAPSULE | Freq: Two times a day (BID) | ORAL | Status: DC
  Administered 2023-02-16 – 2023-02-18 (×5): 100 mg via ORAL
  Filled 2023-02-16 (×5): qty 1

## 2023-02-16 MED ORDER — OXYTOCIN-SODIUM CHLORIDE 30-0.9 UT/500ML-% IV SOLN
INTRAVENOUS | Status: AC
  Filled 2023-02-16: qty 500

## 2023-02-16 MED ORDER — MIDAZOLAM HCL 2 MG/2ML IJ SOLN
INTRAMUSCULAR | Status: DC | PRN
  Administered 2023-02-16: 1 mg via INTRAVENOUS

## 2023-02-16 MED ORDER — SIMETHICONE 80 MG PO CHEW
80.0000 mg | CHEWABLE_TABLET | Freq: Three times a day (TID) | ORAL | Status: DC
  Administered 2023-02-16 – 2023-02-20 (×12): 80 mg via ORAL
  Filled 2023-02-16 (×12): qty 1

## 2023-02-16 MED ORDER — OXYCODONE HCL 5 MG PO TABS: 5.0000 mg | ORAL_TABLET | Freq: Once | ORAL | Status: DC | PRN

## 2023-02-16 MED ORDER — MORPHINE SULFATE (PF) 0.5 MG/ML IJ SOLN
INTRAMUSCULAR | Status: AC
  Filled 2023-02-16: qty 10

## 2023-02-16 MED ORDER — ACETAMINOPHEN 10 MG/ML IV SOLN
INTRAVENOUS | Status: AC
  Filled 2023-02-16: qty 100

## 2023-02-16 MED ORDER — IBUPROFEN 600 MG PO TABS
600.0000 mg | ORAL_TABLET | Freq: Four times a day (QID) | ORAL | Status: DC
Start: 2023-02-17 — End: 2023-02-20
  Administered 2023-02-17 – 2023-02-20 (×11): 600 mg via ORAL
  Filled 2023-02-16 (×11): qty 1

## 2023-02-16 MED ORDER — MORPHINE SULFATE (PF) 0.5 MG/ML IJ SOLN
INTRAMUSCULAR | Status: DC | PRN
  Administered 2023-02-16: 150 ug via INTRATHECAL

## 2023-02-16 MED ORDER — KETOROLAC TROMETHAMINE 30 MG/ML IJ SOLN
30.0000 mg | Freq: Four times a day (QID) | INTRAMUSCULAR | Status: AC
  Administered 2023-02-16 – 2023-02-17 (×4): 30 mg via INTRAVENOUS
  Filled 2023-02-16 (×4): qty 1

## 2023-02-16 MED ORDER — SENNOSIDES-DOCUSATE SODIUM 8.6-50 MG PO TABS
2.0000 | ORAL_TABLET | Freq: Every day | ORAL | Status: DC
  Administered 2023-02-17 – 2023-02-20 (×4): 2 via ORAL
  Filled 2023-02-16 (×4): qty 2

## 2023-02-16 MED ORDER — TRANEXAMIC ACID-NACL 1000-0.7 MG/100ML-% IV SOLN
INTRAVENOUS | Status: AC
  Filled 2023-02-16: qty 100

## 2023-02-16 MED ORDER — FENTANYL CITRATE (PF) 100 MCG/2ML IJ SOLN
INTRAMUSCULAR | Status: DC | PRN
  Administered 2023-02-16: 15 ug via INTRATHECAL

## 2023-02-16 SURGICAL SUPPLY — 33 items
BENZOIN TINCTURE PRP APPL 2/3 (GAUZE/BANDAGES/DRESSINGS) IMPLANT
CHLORAPREP W/TINT 26 (MISCELLANEOUS) ×4 IMPLANT
CLAMP UMBILICAL CORD (MISCELLANEOUS) ×2 IMPLANT
CLOTH BEACON ORANGE TIMEOUT ST (SAFETY) ×2 IMPLANT
DRSG OPSITE POSTOP 4X10 (GAUZE/BANDAGES/DRESSINGS) ×2 IMPLANT
ELECT REM PT RETURN 9FT ADLT (ELECTROSURGICAL) ×2 IMPLANT
ELECTRODE REM PT RTRN 9FT ADLT (ELECTROSURGICAL) ×2 IMPLANT
EXTRACTOR VACUUM KIWI (MISCELLANEOUS) IMPLANT
GAUZE SPONGE 4X4 12PLY STRL LF (GAUZE/BANDAGES/DRESSINGS) IMPLANT
GLOVE BIO SURGEON STRL SZ 6 (GLOVE) ×2 IMPLANT
GLOVE BIOGEL PI IND STRL 7.0 (GLOVE) ×4 IMPLANT
GOWN STRL REUS W/TWL LRG LVL3 (GOWN DISPOSABLE) ×4 IMPLANT
KIT ABG SYR 3ML LUER SLIP (SYRINGE) IMPLANT
MAT PREVALON FULL STRYKER (MISCELLANEOUS) IMPLANT
NDL HYPO 25X5/8 SAFETYGLIDE (NEEDLE) IMPLANT
NEEDLE HYPO 22GX1.5 SAFETY (NEEDLE) IMPLANT
NEEDLE HYPO 25X5/8 SAFETYGLIDE (NEEDLE) IMPLANT
NS IRRIG 1000ML POUR BTL (IV SOLUTION) ×2 IMPLANT
PACK C SECTION WH (CUSTOM PROCEDURE TRAY) ×2 IMPLANT
PAD ABD DERMACEA PRESS 5X9 (GAUZE/BANDAGES/DRESSINGS) IMPLANT
PAD OB MATERNITY 4.3X12.25 (PERSONAL CARE ITEMS) ×2 IMPLANT
RETRACTOR WND ALEXIS 25 LRG (MISCELLANEOUS) IMPLANT
RTRCTR WOUND ALEXIS 25CM LRG (MISCELLANEOUS) IMPLANT
STRIP CLOSURE SKIN 1/2X4 (GAUZE/BANDAGES/DRESSINGS) IMPLANT
SUT MON AB 4-0 PS1 27 (SUTURE) ×2 IMPLANT
SUT PLAIN 0 NONE (SUTURE) ×2 IMPLANT
SUT VIC AB 0 CT1 36 (SUTURE) ×6 IMPLANT
SUT VIC AB 2-0 CT1 TAPERPNT 27 (SUTURE) ×2 IMPLANT
SUT VIC AB 3-0 SH 27X BRD (SUTURE) IMPLANT
SYR CONTROL 10ML LL (SYRINGE) IMPLANT
TOWEL OR 17X24 6PK STRL BLUE (TOWEL DISPOSABLE) ×2 IMPLANT
TRAY FOLEY W/BAG SLVR 14FR LF (SET/KITS/TRAYS/PACK) IMPLANT
WATER STERILE IRR 1000ML POUR (IV SOLUTION) ×2 IMPLANT

## 2023-02-16 NOTE — Op Note (Signed)
Kimberly Stark PROCEDURE DATE: 02/16/2023  PREOPERATIVE DIAGNOSES: Intrauterine pregnancy at [redacted]w[redacted]d weeks gestation; previous uterine incision 2x prior c-section and severe preeclampsia  POSTOPERATIVE DIAGNOSES: The same  PROCEDURE: Repeat Low Transverse Cesarean Section  SURGEON:  Dr. Milas Hock  ASSISTANT:  Dr. Wyn Forster An experienced assistant was required given the standard of surgical care given the complexity of the case.  This assistant was needed for exposure, dissection, suctioning, retraction, instrument exchange, and for overall help during the procedure.  ANESTHESIOLOGY TEAM: Anesthesiologist: Mariann Barter, MD CRNA: Graciela Husbands, CRNA; Elgie Congo, CRNA  INDICATIONS: Kimberly Stark is a 32 y.o. [id number] at [redacted]w[redacted]d here for cesarean section secondary to the indications listed under preoperative diagnoses; please see preoperative note for further details.  The risks of surgery were discussed with the patient including but were not limited to: bleeding which may require transfusion or reoperation; infection which may require antibiotics; injury to bowel, bladder, ureters or other surrounding organs; injury to the fetus; need for additional procedures including hysterectomy in the event of a life-threatening hemorrhage; formation of adhesions; placental abnormalities wth subsequent pregnancies; incisional problems; thromboembolic phenomenon and other postoperative/anesthesia complications.  The patient concurred with the proposed plan, giving informed written consent for the procedure.    FINDINGS:  Viable female infant in cephalic presentation.  Apgars 4,8, and 9.  Clear amniotic fluid.  Intact placenta, three vessel cord.  Normal uterus, fallopian tubes and ovaries bilaterally. Mild adhesive disease superficial to the hysterotomy of peritoneum and omentum. Minimal adhesive disease of the uterine serosa.  ANESTHESIA: Spinal INTRAVENOUS FLUIDS: 472 ml   ESTIMATED BLOOD  LOSS: 448 ml URINE OUTPUT:  550 ml SPECIMENS: Placenta sent to pathology COMPLICATIONS: None immediate  PROCEDURE IN DETAIL:  The patient preoperatively received intravenous antibiotics and had sequential compression devices applied to her lower extremities.  She was then taken to the operating room where spinal anesthesia was administered was administed. She was then placed in a dorsal supine position with a leftward tilt, and prepped and draped in a sterile manner.  A foley catheter was placed into her bladder and attached to constant gravity.    After an adequate timeout was performed, a Pfannenstiel skin incision was made with scalpel on her preexisting scar and carried through to the underlying layer of fascia. The fascia was incised in the midline, and this incision was extended bilaterally using the Mayo scissors.  Kocher clamps were applied to the superior aspect of the fascial incision and the underlying rectus muscles were dissected off bluntly and sharply.  A similar process was carried out on the inferior aspect of the fascial incision. The rectus muscles were separated in the midline and the peritoneum was entered bluntly. The Alexis self-retaining retractor was introduced into the abdominal cavity.  Attention was turned to the lower uterine segment where a low transverse hysterotomy was made with a scalpel and extended bilaterally bluntly.  The infant was successfully delivered, the cord was clamped and cut after one minute, and the infant was handed over to the awaiting neonatology team. Uterine massage was then administered, and the placenta delivered intact with a three-vessel cord. The uterus was then exteriorized and cleared of clots and debris.    The hysterotomy was closed with 0 Vicryl in a running locked fashion, and an imbricating layer was also placed with 0 Vicryl. Figure-of-eight 0 Vicryl serosal stitches were placed to help with hemostasis.  Attention was then turned to the  left fallopian tube. It was  visualized and grasped with a Babcock clamp.  Using the Babcock clamp the tube was followed all the way down to the fimbriated edge.  Kelly clamps were then placed across the lower third of the tube.  This portion of the tube was then ligated and sutured using a tie of plain gut.  An additional suture of 2-0 vicryl was placed and excellent hemostasis was confirmed. Attention was turned to the right side and in a similar fashion the lower portion of the right fallopian tube was clamped, cut and suture ligated.  Excellent hemostasis was confirmed.    The pelvis was cleared of all clot and debris. The retractor was removed.  The peritoneum was closed with a 0 Vicryl running stitch. The fascia was then closed using 0 Vicryl in a running fashion.  The subcutaneous layer was irrigated and everything was hemostatic . The skin was closed with a 4-0 Vicryl subcuticular stitch. The patient tolerated the procedure well. Sponge, instrument and needle counts were correct x 3.  She was taken to the recovery room in stable condition.    Wyn Forster, MD FMOB Fellow, Faculty practice [facility], Center for [facility]

## 2023-02-16 NOTE — Transfer of Care (Signed)
Immediate Anesthesia Transfer of Care Note  Patient: Kimberly Stark  Procedure(s) Performed: CESAREAN SECTION  Patient Location: PACU  Anesthesia Type:Spinal  Level of Consciousness: awake, alert , and oriented  Airway & Oxygen Therapy: Patient Spontanous Breathing  Post-op Assessment: Report given to RN and Post -op Vital signs reviewed and stable  Post vital signs: Reviewed and stable  Last Vitals:  Vitals Value Taken Time  BP 127/58 02/16/23 1200  Temp    Pulse 81 02/16/23 1205  Resp 18 02/16/23 1205  SpO2 99 % 02/16/23 1205  Vitals shown include unfiled device data.  Last Pain:  Vitals:   02/16/23 0834  TempSrc:   PainSc: 5          Complications: No notable events documented.

## 2023-02-16 NOTE — Progress Notes (Signed)
FACULTY PRACTICE ANTEPARTUM(COMPREHENSIVE) NOTE  Kimberly Stark is a 32 y.o. [id number] at [redacted]w[redacted]d  who is admitted for cHTN with superimposed preeclampsia, concerned about progression to severe preeclampsia.    Fetal presentation is cephalic. Length of Stay:  2  Days  Date of admission:02/14/2023  Subjective: Reports having painful contractions that she rates 3/10, similar to the ones she has during a past induction of labor.  Desires a cervical check.  Also reports having a frontal headache that is 4/10, despite initiation of magnesium sulfate and administration of Oxycodone 5 mg.  Denies blurry vision, denies RUQ pain.   Patient reports the fetal movement as active. Patient reports uterine contraction  activity as none. Patient reports  vaginal bleeding as none. Patient describes fluid per vagina as none.  Vitals:  Blood pressure 134/70, pulse 88, temperature 98.4 F (36.9 C), temperature source Oral, resp. rate 18, SpO2 99%, currently breastfeeding. Vitals:   02/16/23 0200 02/16/23 0214 02/16/23 0253 02/16/23 0300  BP:  139/68  134/70  Pulse:  84  88  Resp: 16  16 18   Temp:      TempSrc:      SpO2:    99%   Physical Examination: General appearance - alert, well appearing, and in no distress Mental status - normal mood, behavior, speech, dress, motor activity, and thought processes Chest - CTAB Heart - normal rate and regular rhythm Abdomen - gravid, soft and non-tender Extremities - no edema, no calf tenderness. 2+ DTRs, no clonus  Skin: warm and dry  Fetal Monitoring:  Baseline: 135 bpm, Variability: moderate variability, Accelerations: +15x15 , and Decelerations: Absent  >>reactive  Labs:  Results for orders placed or performed during the hospital encounter of 02/14/23 (from the past 24 hour(s))  Glucose, capillary   Collection Time: 02/15/23  4:28 AM  Result Value Ref Range   Glucose-Capillary 173 (H) 70 - 99 mg/dL  Glucose, capillary   Collection Time: 02/15/23  5:28 AM   Result Value Ref Range   Glucose-Capillary 171 (H) 70 - 99 mg/dL  Glucose, capillary   Collection Time: 02/15/23  6:29 AM  Result Value Ref Range   Glucose-Capillary 158 (H) 70 - 99 mg/dL  Glucose, capillary   Collection Time: 02/15/23  7:27 AM  Result Value Ref Range   Glucose-Capillary 157 (H) 70 - 99 mg/dL  Glucose, capillary   Collection Time: 02/15/23  8:34 AM  Result Value Ref Range   Glucose-Capillary 131 (H) 70 - 99 mg/dL  Glucose, capillary   Collection Time: 02/15/23  9:42 AM  Result Value Ref Range   Glucose-Capillary 116 (H) 70 - 99 mg/dL  Glucose, capillary   Collection Time: 02/15/23 10:15 AM  Result Value Ref Range   Glucose-Capillary 159 (H) 70 - 99 mg/dL  Glucose, capillary   Collection Time: 02/15/23 11:35 AM  Result Value Ref Range   Glucose-Capillary 184 (H) 70 - 99 mg/dL  Glucose, capillary   Collection Time: 02/15/23 12:48 PM  Result Value Ref Range   Glucose-Capillary 214 (H) 70 - 99 mg/dL  Glucose, capillary   Collection Time: 02/15/23  1:14 PM  Result Value Ref Range   Glucose-Capillary 176 (H) 70 - 99 mg/dL  Glucose, capillary   Collection Time: 02/15/23  1:48 PM  Result Value Ref Range   Glucose-Capillary 190 (H) 70 - 99 mg/dL  Glucose, capillary   Collection Time: 02/15/23  2:49 PM  Result Value Ref Range   Glucose-Capillary 211 (H) 70 - 99 mg/dL  Glucose,  capillary   Collection Time: 02/15/23  3:15 PM  Result Value Ref Range   Glucose-Capillary 226 (H) 70 - 99 mg/dL  Glucose, capillary   Collection Time: 02/15/23  3:45 PM  Result Value Ref Range   Glucose-Capillary 235 (H) 70 - 99 mg/dL  Glucose, capillary   Collection Time: 02/15/23  6:32 PM  Result Value Ref Range   Glucose-Capillary 184 (H) 70 - 99 mg/dL  Comprehensive metabolic panel   Collection Time: 02/15/23  6:58 PM  Result Value Ref Range   Sodium 133 (L) 135 - 145 mmol/L   Potassium 3.5 3.5 - 5.1 mmol/L   Chloride 106 98 - 111 mmol/L   CO2 18 (L) 22 - 32 mmol/L    Glucose, Bld 198 (H) 70 - 99 mg/dL   BUN 5 (L) 6 - 20 mg/dL   Creatinine, Ser 1.61 0.44 - 1.00 mg/dL   Calcium 9.6 8.9 - 09.6 mg/dL   Total Protein 5.8 (L) 6.5 - 8.1 g/dL   Albumin 2.5 (L) 3.5 - 5.0 g/dL   AST 15 15 - 41 U/L   ALT 11 0 - 44 U/L   Alkaline Phosphatase 153 (H) 38 - 126 U/L   Total Bilirubin 0.4 <1.2 mg/dL   GFR, Estimated >04 >54 mL/min   Anion gap 9 5 - 15  CBC   Collection Time: 02/15/23  6:58 PM  Result Value Ref Range   WBC 9.8 4.0 - 10.5 K/uL   RBC 3.95 3.87 - 5.11 MIL/uL   Hemoglobin 9.8 (L) 12.0 - 15.0 g/dL   HCT 09.8 (L) 11.9 - 14.7 %   MCV 79.7 (L) 80.0 - 100.0 fL   MCH 24.8 (L) 26.0 - 34.0 pg   MCHC 31.1 30.0 - 36.0 g/dL   RDW 82.9 56.2 - 13.0 %   Platelets 365 150 - 400 K/uL   nRBC 2.5 (H) 0.0 - 0.2 %  Glucose, capillary   Collection Time: 02/15/23  9:09 PM  Result Value Ref Range   Glucose-Capillary 198 (H) 70 - 99 mg/dL    Imaging Studies:    [place] MFM FETAL BPP WO NON STRESS  Result Date: 02/13/2023 ----------------------------------------------------------------------  OBSTETRICS REPORT                       (Signed Final 02/13/2023 04:33 pm) ---------------------------------------------------------------------- Patient Info  ID #:       [id number]                          D.O.B.:  September 29, 1990 (32 yrs)(F)  Name:       Kimberly Stark                   Visit Date: 02/13/2023 03:21 pm ---------------------------------------------------------------------- Performed By  Attending:        Ma Rings MD         Ref. Address:     [address]                                                             [place], [place]  [address]  Performed By:     Isac Sarna        [facility]                                  [facility]  Referred By:      [facility] ---------------------------------------------------------------------- Orders  #  Description                           Code        Ordered By  1  [place] MFM FETAL BPP WO NON               76819.01    RAVI SHANKAR     STRESS  2  [place] MFM OB FOLLOW UP                   76816.01    RAVI SHANKAR ----------------------------------------------------------------------  #  Order #                     Accession #                Episode #  1  [id number]                   [id number]                 [id number]  2  [id number]                   [id number]                 [id number] ---------------------------------------------------------------------- Indications  Hypertension - Chronic/Pre-existing            O10.019  (Labetalol)  Gestational diabetes in pregnancy, diet        O24.410  controlled  Medical complication of pregnancy (history     O26.90  of PE, Arnold-Chiari malformation, asthma,  Bipolar disease)  Obesity complicating pregnancy, third          O99.213  trimester  Cystic Fibrosis (CF) Carrier, third trimester  O09.893  Anemia during pregnancy in third trimester     O99.013  Poor obstetric history-Recurrent (habitual     O26.20  abortion, previous LEEP x 2, previous  HELLP, previous C/S x 2)  Poor obstetric history: Previous preterm  O09.219  delivery, antepartum (30 weeks)  History of cervical cerclage, currently        O09.299  pregnant  Tobacco use complicating pregnancy, third      O99.333  trimester  [redacted] weeks gestation of pregnancy                Z3A.32  Encounter for other antenatal screening        Z36.2  follow-up  Polyhydramnios, third trimester, antepartum    O40.3XX0  condition or complication, unspecified fetus ---------------------------------------------------------------------- Fetal Evaluation  Num Of Fetuses:         1  Fetal Heart Rate(bpm):  149  Cardiac Activity:       Observed  Presentation:           Cephalic  Placenta:                Anterior  P. Cord Insertion:      Previously seen  Amniotic Fluid  AFI FV:      Polyhydramnios  AFI Sum(cm)     %Tile       Largest Pocket(cm)  25.98           96          9.94  RUQ(cm)       RLQ(cm)       LUQ(cm)        LLQ(cm)  9.94          6.89          4.48           4.67 ---------------------------------------------------------------------- Biophysical Evaluation  Amniotic F.V:   Pocket => 2 cm             F. Tone:        Observed  F. Movement:    Observed                   Score:          8/8  F. Breathing:   Observed ---------------------------------------------------------------------- Biometry  BPD:      79.4  mm     G. Age:  31w 6d         17  %    CI:        75.39   %    70 - 86                                                          FL/HC:      20.1   %    19.9 - 21.5  HC:       290   mm     G. Age:  32w 0d        4.1  %    HC/AC:      0.90        0.96 - 1.11  AC:      322.7  mm     G. Age:  36w 1d       > 99  %    FL/BPD:     73.4   %    71 - 87  FL:       58.3  mm     G. Age:  30w 3d        1.9  %  FL/AC:      18.1   %    20 - 24  Est. FW:    2280  gm           5 lb     71  % ---------------------------------------------------------------------- OB History  Blood Type:   O+  Gravidity:    12        Term:   2        Prem:   1        SAB:   7  TOP:          1        Living:  3 ---------------------------------------------------------------------- Gestational Age  U/S Today:     32w 4d                                        EDD:   04/06/23  Best:          32w 6d     Det. By:  U/S C R L  (09/21/22)    EDD:   04/04/23 ---------------------------------------------------------------------- Targeted Anatomy  Central Nervous System  Calvarium/Cranial V.:  Appears normal         Cereb./Vermis:          Previously seen  Cavum:                 Previously seen        [place]:         Previously seen  Lateral Ventricles:    Previously seen        Midline Falx:           Previously seen  Choroid Plexus:         Previously seen  Spine  Cervical:              Previously seen        Sacral:                 Previously seen  Thoracic:              Previously seen        Shape/Curvature:        Previously seen  Lumbar:                Previously seen  Head/Neck  Lips:                  Previously seen        Profile:                Previously seen  Neck:                  Previously seen        Orbits/Eyes:            Previously seen  Nuchal Fold:           Previously seen        Mandible:               Previously seen  Nasal Bone:            Present                Maxilla:                Previously seen  Thorax  4 Chamber View:  Previously seen        Interventr. Septum:     Previously seen  Cardiac Rhythm:        Normal                 Cardiac Axis:           Normal  Cardiac Situs:         Previously seen        Diaphragm:              Appears normal  Rt Outflow Tract:      Previously seen        3 Vessel View:          Previously seen  Lt Outflow Tract:      Previously seen        3 V Trachea View:       Previously seen  Aortic Arch:           Appears normal         IVC:                    Previously seen  Ductal Arch:           Previously seen        Crossing:               Previously seen  SVC:                   Previously seen  Abdomen  Ventral Wall:          Previously seen        Lt Kidney:              Appears normal  Cord Insertion:        Previously seen        Rt Kidney:              Appears normal  Situs:                 Previously seen        Bladder:                Appears normal  Stomach:               Appears normal  Extremities  Lt Humerus:            Previously seen        Lt Femur:               Previously seen  Rt Humerus:            Previously seen        Rt Femur:               Previously seen  Lt Forearm:            Previously seen        Lt Lower Leg:           Previously seen  Rt Forearm:            Previously seen        Rt Lower Leg:           Previously seen  Lt Hand:               Previously  seen        Lt Foot:  Previously seen  Rt Hand:               Previously seen        Rt Foot:                Previously seen  Other  Umbilical Cord:        Previously seen        Genitalia:              Female prev seen ---------------------------------------------------------------------- Comments  This patient was seen for a follow up exam due to chronic  hypertension treated with labetalol 300 mg 3 times a day,  history of a prior pulmonary embolus treated with prophylactic  Lovenox, and recently diagnosed diet-controlled gestational  diabetes.  The patient was evaluated in the MAU 2 days ago due to  elevated blood pressures.  Her PIH labs were all within  normal limits.  However, her P/C ratio was 1.34 indicating that  she is probably developing preeclampsia.  The patient's  blood pressures today were 162/72 and 154/79.  She denies  any signs or symptoms of preeclampsia.  She was informed that the fetal growth appears appropriate  for her gestational age.  Mild polyhydramnios with a total AFI  of 25.98 cm was noted.  A BPP performed today was 8 out of 8.  Due to chronic hypertension with superimposed  preeclampsia, the patient was advised that the goal for her  delivery would be at 34 weeks or greater.  Preeclampsia precautions were reviewed today.  She was  advised to continue to monitor her blood pressures at home  and to call should her blood pressures be in the severe  range.  She will return in 1 week for another BPP. ----------------------------------------------------------------------                   Ma Rings, MD Electronically Signed Final Report   02/13/2023 04:33 pm ----------------------------------------------------------------------   [place] MFM OB FOLLOW UP  Result Date: 02/13/2023 ----------------------------------------------------------------------  OBSTETRICS REPORT                       (Signed Final 02/13/2023 04:33 pm)  ---------------------------------------------------------------------- Patient Info  ID #:       [id number]                          D.O.B.:  01/11/1991 (32 yrs)(F)  Name:       Kimberly Stark                   Visit Date: 02/13/2023 03:21 pm ---------------------------------------------------------------------- Performed By  Attending:        Ma Rings MD         Ref. Address:     [address]                                                             [place], [place]  [address]  Performed By:     Isac Sarna        Location[facility]                                  [facility]  Referred By:      [facility] ---------------------------------------------------------------------- Orders  #  Description                           Code        Ordered By  1  [place] MFM FETAL BPP WO NON               76819.01    RAVI SHANKAR     STRESS  2  [place] MFM OB FOLLOW UP                   76816.01    RAVI SHANKAR ----------------------------------------------------------------------  #  Order #                     Accession #                Episode #  1  [id number]                   [id number]                 [id number]  2  [id number]                   [id number]                 [id number] ---------------------------------------------------------------------- Indications  Hypertension - Chronic/Pre-existing            O10.019  (Labetalol)  Gestational diabetes in pregnancy, diet        O24.410  controlled  Medical complication of pregnancy (history     O26.90  of PE, Arnold-Chiari malformation, asthma,  Bipolar disease)  Obesity complicating pregnancy, third          O99.213  trimester  Cystic Fibrosis (CF) Carrier, third trimester  O09.893  Anemia during pregnancy in  third trimester     O99.013  Poor obstetric history-Recurrent (habitual     O26.20  abortion, previous LEEP x 2, previous  HELLP, previous C/S x 2)  Poor obstetric history: Previous preterm  O09.219  delivery, antepartum (30 weeks)  History of cervical cerclage, currently        O09.299  pregnant  Tobacco use complicating pregnancy, third      O99.333  trimester  [redacted] weeks gestation of pregnancy                Z3A.32  Encounter for other antenatal screening        Z36.2  follow-up  Polyhydramnios, third trimester, antepartum    O40.3XX0  condition or complication, unspecified fetus ---------------------------------------------------------------------- Fetal Evaluation  Num Of Fetuses:         1  Fetal Heart Rate(bpm):  149  Cardiac Activity:       Observed  Presentation:           Cephalic  Placenta:               Anterior  P. Cord Insertion:      Previously seen  Amniotic Fluid  AFI FV:      Polyhydramnios  AFI Sum(cm)     %Tile       Largest Pocket(cm)  25.98           96          9.94  RUQ(cm)       RLQ(cm)       LUQ(cm)        LLQ(cm)  9.94          6.89          4.48           4.67 ---------------------------------------------------------------------- Biophysical Evaluation  Amniotic F.V:   Pocket => 2 cm             F. Tone:        Observed  F. Movement:    Observed                   Score:          8/8  F. Breathing:   Observed ---------------------------------------------------------------------- Biometry  BPD:      79.4  mm     G. Age:  31w 6d         17  %    CI:        75.39   %    70 - 86                                                          FL/HC:      20.1   %    19.9 - 21.5  HC:       290   mm     G. Age:  32w 0d        4.1  %    HC/AC:      0.90        0.96 - 1.11  AC:      322.7  mm     G. Age:  36w 1d       > 99  %    FL/BPD:     73.4   %    71 - 87  FL:       58.3  mm     G. Age:  30w 3d        1.9  %  FL/AC:      18.1   %    20 - 24  Est. FW:    2280  gm           5 lb     71  %  ---------------------------------------------------------------------- OB History  Blood Type:   O+  Gravidity:    12        Term:   2        Prem:   1        SAB:   7  TOP:          1        Living:  3 ---------------------------------------------------------------------- Gestational Age  U/S Today:     32w 4d                                        EDD:   04/06/23  Best:          32w 6d     Det. By:  U/S C R L  (09/21/22)    EDD:   04/04/23 ---------------------------------------------------------------------- Targeted Anatomy  Central Nervous System  Calvarium/Cranial V.:  Appears normal         Cereb./Vermis:          Previously seen  Cavum:                 Previously seen        [place] [employer] [place]:         Previously seen  Lateral Ventricles:    Previously seen        Midline Falx:           Previously seen  Choroid Plexus:        Previously seen  Spine  Cervical:              Previously seen        Sacral:                 Previously seen  Thoracic:              Previously seen        Shape/Curvature:        Previously seen  Lumbar:                Previously seen  Head/Neck  Lips:                  Previously seen        Profile:                Previously seen  Neck:                  Previously seen        Orbits/Eyes:            Previously seen  Nuchal Fold:           Previously seen        Mandible:               Previously seen  Nasal Bone:            Present                Maxilla:                Previously seen  Thorax  4 Chamber View:  Previously seen        Interventr. Septum:     Previously seen  Cardiac Rhythm:        Normal                 Cardiac Axis:           Normal  Cardiac Situs:         Previously seen        Diaphragm:              Appears normal  Rt Outflow Tract:      Previously seen        3 Vessel View:          Previously seen  Lt Outflow Tract:      Previously seen        3 V Trachea View:       Previously seen  Aortic Arch:           Appears normal         IVC:                     Previously seen  Ductal Arch:           Previously seen        Crossing:               Previously seen  SVC:                   Previously seen  Abdomen  Ventral Wall:          Previously seen        Lt Kidney:              Appears normal  Cord Insertion:        Previously seen        Rt Kidney:              Appears normal  Situs:                 Previously seen        Bladder:                Appears normal  Stomach:               Appears normal  Extremities  Lt Humerus:            Previously seen        Lt Femur:               Previously seen  Rt Humerus:            Previously seen        Rt Femur:               Previously seen  Lt Forearm:            Previously seen        Lt Lower Leg:           Previously seen  Rt Forearm:            Previously seen        Rt Lower Leg:           Previously seen  Lt Hand:               Previously seen        Lt Foot:  Previously seen  Rt Hand:               Previously seen        Rt Foot:                Previously seen  Other  Umbilical Cord:        Previously seen        Genitalia:              Female prev seen ---------------------------------------------------------------------- Comments  This patient was seen for a follow up exam due to chronic  hypertension treated with labetalol 300 mg 3 times a day,  history of a prior pulmonary embolus treated with prophylactic  Lovenox, and recently diagnosed diet-controlled gestational  diabetes.  The patient was evaluated in the MAU 2 days ago due to  elevated blood pressures.  Her PIH labs were all within  normal limits.  However, her P/C ratio was 1.34 indicating that  she is probably developing preeclampsia.  The patient's  blood pressures today were 162/72 and 154/79.  She denies  any signs or symptoms of preeclampsia.  She was informed that the fetal growth appears appropriate  for her gestational age.  Mild polyhydramnios with a total AFI  of 25.98 cm was noted.  A BPP performed today was 8 out of 8.  Due to chronic  hypertension with superimposed  preeclampsia, the patient was advised that the goal for her  delivery would be at 34 weeks or greater.  Preeclampsia precautions were reviewed today.  She was  advised to continue to monitor her blood pressures at home  and to call should her blood pressures be in the severe  range.  She will return in 1 week for another BPP. ----------------------------------------------------------------------                   Ma Rings, MD Electronically Signed Final Report   02/13/2023 04:33 pm ----------------------------------------------------------------------     Medications:  Scheduled  amLODipine  5 mg Oral Daily   betamethasone acetate-betamethasone sodium phosphate  12 mg Intramuscular Q24 Hr x 2   clotrimazole  1 Applicatorful Vaginal QHS   docusate sodium  100 mg Oral Daily   insulin aspart  0-14 Units Subcutaneous TID PC   insulin NPH Human  10 Units Subcutaneous BID AC & HS   labetalol  600 mg Oral Q8H   metFORMIN  500 mg Oral BID WC   prenatal multivitamin  1 tablet Oral Q1200   sodium chloride flush  10 mL Intravenous Q12H   I have reviewed the patient's current medications.  ASSESSMENT: [id number] [redacted]w[redacted]d Estimated Date of Delivery: 04/04/23  Patient Active Problem List   Diagnosis Date Noted   Chronic hypertension with superimposed preeclampsia 02/14/2023   GDM (gestational diabetes mellitus) 01/03/2023   Unwanted fertility 10/24/2022   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/13/2021   History of LEEP (loop electrosurgical excision procedure) of cervix complicating pregnancy in third trimester 11/21/2021   BMI 38.0-38.9,adult 11/21/2021   History of preterm delivery, currently pregnant in third trimester 11/21/2021   Post-traumatic stress disorder, chronic 09/19/2021   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   History of pulmonary embolus (PE) 06/22/2021   Tobacco abuse 06/22/2021   History of 2 cesarean sections 06/22/2021   History of HELLP syndrome,  currently pregnant, third trimester 06/09/2021   Supervision of high risk pregnancy, antepartum 06/09/2021   History of cervical cerclage 06/09/2021   Iron deficiency anemia 07/03/2019    PLAN: 1)  cHTN with superimposed preeclampsia - BP stable on on Labetalol 600mg  TID and Amlodipine 5mg  daily.  -labs stable on 12/5, plan to complete as clinically indicated -Headache still 4/10. Will give second dose of Oxycodone, and also give Excedrin Tension and Phenergan,  If still worsening despite all these interventions, will need to move forward with delivery.    2) GDMA1> GDMA2 -increased NPH to 10 units bid, Novolog sliding scale -continue metformin 500 mg twice daily  3) Fetal well being -Cat. I- reactive NST -will get BMZ #2 today -last [place] 12/3- cephalic EFW: 2280g (71%) -Neonatology consulted, he will come to speak with patient very soon.  4) Prior PE -currently on Lovenox, held for now given persistent headache and possible need to move towards delivery/C-section soon.  5)Prior C-section x 2, plan for repeat C-section with salpingectomy- consent completed 10/22  6) Maternal care -PNV daily -tylenol prn -encourage ambulation as tolerated -SCDs in place  Continue close observation.   Jaynie Collins, MD, FACOG Obstetrician & Gynecologist, [facility] for [employer], [facility]

## 2023-02-16 NOTE — Anesthesia Postprocedure Evaluation (Signed)
Anesthesia Post Note  Patient: Taleaha Grosso  Procedure(s) Performed: CESAREAN SECTION BILATERAL TUBAL LIGATION (Bilateral)     Patient location during evaluation: PACU Anesthesia Type: Spinal Level of consciousness: oriented and awake and alert Pain management: pain level controlled Vital Signs Assessment: post-procedure vital signs reviewed and stable Respiratory status: spontaneous breathing, respiratory function stable and patient connected to nasal cannula oxygen Cardiovascular status: blood pressure returned to baseline and stable Postop Assessment: no headache, no backache and no apparent nausea or vomiting Anesthetic complications: no   No notable events documented.  Last Vitals:  Vitals:   02/16/23 1315 02/16/23 1336  BP: 129/67 138/68  Pulse: 82 89  Resp: 18 17  Temp:  36.8 C  SpO2: 99% 98%    Last Pain:  Vitals:   02/16/23 1336  TempSrc: Oral  PainSc: 5    Pain Goal:    LLE Motor Response: Purposeful movement (02/16/23 1315)   RLE Motor Response: Purposeful movement (02/16/23 1315)       Epidural/Spinal Function Cutaneous sensation: Able to Wiggle Toes (02/16/23 1336), Patient able to flex knees: Yes (02/16/23 1336), Patient able to lift hips off bed: No (02/16/23 1336), Back pain beyond tenderness at insertion site: No (02/16/23 1336), Progressively worsening motor and/or sensory loss: No (02/16/23 1336), Bowel and/or bladder incontinence post epidural: No (02/16/23 1336)  Mariann Barter

## 2023-02-16 NOTE — Anesthesia Procedure Notes (Signed)
Spinal  Patient location during procedure: OB Start time: 02/16/2023 10:30 AM End time: 02/16/2023 10:35 AM Staffing Performed: anesthesiologist  Anesthesiologist: Mariann Barter, MD Performed by: Mariann Barter, MD Authorized by: Mariann Barter, MD   Preanesthetic Checklist Completed: patient identified, IV checked, site marked, risks and benefits discussed, surgical consent, monitors and equipment checked, pre-op evaluation and timeout performed Spinal Block Patient position: sitting Prep: ChloraPrep Patient monitoring: heart rate, cardiac monitor, continuous pulse ox and blood pressure Approach: midline Location: L4-5 Injection technique: single-shot Needle Needle type: Quincke  Needle gauge: 24 G Needle length: 5 cm Assessment Events: CSF return

## 2023-02-16 NOTE — Consult Note (Signed)
[facility]  Prenatal Consult      02/16/2023   5:00 AM  I was asked by Dr. Macon Large to consult on this patient for possible preterm delivery. I had the pleasure of meeting with Kimberly Stark and her partner today.   She is a 32 yo [phone] female presenting at [redacted]w[redacted]d IUP with concerns for threatened need for delivery due to preeclampsia with severe features. Pregnancy has also been complicated by cHTN, GDM. She has received BMZ x1 (last dose 12/5 at 1222), and will receive her second dose this morning. Most recent estimated fetal weight of 2280 grams on 12/3 ([redacted]w[redacted]d). Prior history of delivery at 30w due to HELLP.  We discussed the possible needs for an infant born at this gestation. I explained that the neonatal intensive care team would be present for the delivery and outlined the likely delivery room course for this baby including routine resuscitation and NRP-guided approaches to the treatment of respiratory distress. We discussed the potential need for CPAP, and less likely mechanical ventilation and surfactant administration, for respiratory distress, enteral feeds and possibly supplemental IV fluids, temperature support, and monitoring.   We discussed the importance of good nutrition and various methods of providing nutrition (parenteral hyperalimentation, gavage feedings and/or oral feeding). We discussed the benefits of human milk. I encouraged breast feeding and pumping soon after birth and outlined resources that are available to support breast feeding. We discussed the possibility of using donor breast milk as a bridge.  We discussed the average length of stay but I noted that the actual LOS would depend on the severity of problems encountered and response to treatments. We discussed visitation policies and the resources available while their child is in the hospital.  They expressed understanding and agreement with the plan for resuscitation and intensive care.  All questions were answered.  Thank you for involving [place] in the care of this patient. A member of our team will be available should the family have additional questions.   Simone Curia, MD Neonatal Medicine  I spent ~40 minutes in consultation time, of which 25 minutes was spent in direct face to face counseling.

## 2023-02-16 NOTE — Anesthesia Preprocedure Evaluation (Signed)
Anesthesia Evaluation  Patient identified by MRN, date of birth, ID band Patient awake    Reviewed: Allergy & Precautions, NPO status , Patient's Chart, lab work & pertinent test results, reviewed documented beta blocker date and time   History of Anesthesia Complications Negative for: history of anesthetic complications  Airway Mallampati: III  TM Distance: >3 FB     Dental no notable dental hx.    Pulmonary asthma , neg COPD, Current Smoker, PE   breath sounds clear to auscultation       Cardiovascular hypertension, (-) Past MI, (-) Cardiac Stents and (-) CABG + Valvular Problems/Murmurs  Rhythm:Regular Rate:Normal     Neuro/Psych  Headaches, neg Seizures PSYCHIATRIC DISORDERS Anxiety  Bipolar Disorder   Chiari malformation - mild per MRI, has had spinal previously with no issue     GI/Hepatic   Endo/Other  diabetes    Renal/GU      Musculoskeletal   Abdominal   Peds  Hematology  (+) Blood dyscrasia, anemia On prophylactic lovenox due to prior PE. Last dose 24h ago.    Anesthesia Other Findings   Reproductive/Obstetrics                              Anesthesia Physical Anesthesia Plan  ASA: 3  Anesthesia Plan: Spinal   Post-op Pain Management:    Induction:   PONV Risk Score and Plan: 2  Airway Management Planned: Natural Airway  Additional Equipment:   Intra-op Plan:   Post-operative Plan:   Informed Consent: I have reviewed the patients History and Physical, chart, labs and discussed the procedure including the risks, benefits and alternatives for the proposed anesthesia with the patient or authorized representative who has indicated his/her understanding and acceptance.       Plan Discussed with: CRNA  Anesthesia Plan Comments:          Anesthesia Quick Evaluation

## 2023-02-16 NOTE — Discharge Summary (Signed)
Postpartum Discharge Summary  Date of Service updated: 02/20/23     Patient Name: Kimberly Stark DOB: 05/18/1990 MRN: [id number]  Date of admission: 02/14/2023 Delivery date:02/16/2023 Delivering provider: Milas Hock Date of discharge: 02/20/2023  Admitting diagnosis: Hypertension in pregnancy [O16.9] S/P repeat low transverse C-section [Z98.891] Intrauterine pregnancy: [redacted]w[redacted]d     Secondary diagnosis:  Principal Problem:   S/P repeat low transverse C-section Active Problems:   History of pulmonary embolus (PE)   Status post repeat low transverse cesarean section   Unwanted fertility   GDM (gestational diabetes mellitus)   Chronic hypertension with superimposed preeclampsia   History of bilateral tubal ligation   Postpartum headache  Additional problems: Hx of PE on lovenox    Discharge diagnosis: Preterm Pregnancy Delivered, Preeclampsia (severe), and CHTN with superimposed preeclampsia                                              Post partum procedures: tubal ligation done during c section Augmentation: N/A Complications: None  Hospital course: Sceduled C/S   32 y.o. yo G12P2284 at [redacted]w[redacted]d was admitted to the hospital 02/14/2023 for scheduled cesarean section with the following indication:Elective Repeat.Delivery details are as follows:  Membrane Rupture Time/Date: 10:51 AM,02/16/2023  Delivery Method:C-Section, Low Transverse Operative Delivery:N/A Details of operation can be found in separate operative note.  Patient had a postpartum course complicated by persistent headache and elevated blood sugars. Pt had neurology consult and diabetic education while still inpatient.  Pt will continue metformin and lantus.  She was educated on the use of the dexcom 7. She is ambulating, tolerating a regular diet, passing flatus, and urinating well. Patient is discharged home in stable condition on  02/20/23.  She will continue lovenox for an additional 6 weeks.        Newborn Data: Birth  date:02/16/2023 Birth time:10:54 AM Gender:Female Living status:Living Apgars:4 ,8  Weight:2530 g    Magnesium Sulfate received: Yes: Seizure prophylaxis BMZ received: Yes Rhophylac:N/A MMR:Yes T-DaP:Declined Flu: No RSV Vaccine received: No Transfusion:No  Immunizations received: Immunization History  Administered Date(s) Administered   Influenza, Seasonal, Injecte, Preservative Fre 02/17/2023   Influenza,inj,Quad PF,6+ Mos 12/11/2020   PNEUMOCOCCAL CONJUGATE-20 02/18/2023   Tdap 10/19/2021, 02/17/2023    Physical exam  Vitals:   02/19/23 1923 02/19/23 2351 02/20/23 0411 02/20/23 0806  BP: (!) 142/70 (!) 147/77 135/70 (!) 150/77  Pulse: 86 87 88 86  Resp: 16 18 16 16   Temp: 98.1 F (36.7 C) 98.4 F (36.9 C) 98.5 F (36.9 C) 98.1 F (36.7 C)  TempSrc:  Oral Oral Oral  SpO2: 100% 99% 99% 98%   General: alert, cooperative, and no distress Lochia: appropriate Uterine Fundus: firm Incision: Dressing is clean, dry, and intact DVT Evaluation: No evidence of DVT seen on physical exam. Negative Homan's sign. Labs: Lab Results  Component Value Date   WBC 17.0 (H) 02/17/2023   HGB 9.2 (L) 02/17/2023   HCT 29.8 (L) 02/17/2023   MCV 81.6 02/17/2023   PLT 443 (H) 02/17/2023      Latest Ref Rng & Units 02/17/2023    5:09 AM  CMP  Glucose 70 - 99 mg/dL 086   BUN 6 - 20 mg/dL <5   Creatinine 5.78 - 1.00 mg/dL 4.69   Sodium 629 - 528 mmol/L 133   Potassium 3.5 - 5.1 mmol/L 4.1  Chloride 98 - 111 mmol/L 104   CO2 22 - 32 mmol/L 17   Calcium 8.9 - 10.3 mg/dL 8.4   Total Protein 6.5 - 8.1 g/dL 5.4   Total Bilirubin <5.4 mg/dL <0.9   Alkaline Phos 38 - 126 U/L 150   AST 15 - 41 U/L 20   ALT 0 - 44 U/L 13    Edinburgh Score:    02/17/2023    9:15 AM  Edinburgh Postnatal Depression Scale Screening Tool  I have been able to laugh and see the funny side of things. 1  I have looked forward with enjoyment to things. 2  I have blamed myself unnecessarily when things  went wrong. 1  I have been anxious or worried for no good reason. 1  I have felt scared or panicky for no good reason. 1  Things have been getting on top of me. 2  I have been so unhappy that I have had difficulty sleeping. 2  I have felt sad or miserable. 2  I have been so unhappy that I have been crying. 1  The thought of harming myself has occurred to me. 0  Edinburgh Postnatal Depression Scale Total 13   No data recorded  After visit meds:  Allergies as of 02/20/2023       Reactions   Iodinated Contrast Media Shortness Of Breath, Rash, Swelling   Latex Anaphylaxis   Shellfish Allergy Anaphylaxis   Iodine Hives   Penicillins Hives        Medication List     STOP taking these medications    promethazine 25 MG tablet Commonly known as: PHENERGAN   terconazole 0.4 % vaginal cream Commonly known as: TERAZOL 7       TAKE these medications    albuterol 108 (90 Base) MCG/ACT inhaler Commonly known as: VENTOLIN HFA Inhale 1-2 puffs into the lungs every 6 (six) hours as needed for wheezing or shortness of breath.   amLODipine 10 MG tablet Commonly known as: NORVASC Take 1 tablet (10 mg total) by mouth daily. Start taking on: February 21, 2023   Dexcom G7 Sensor Misc Apply one sensor to skin every 10 days   enoxaparin 40 MG/0.4ML injection Commonly known as: LOVENOX Inject 0.4 mLs (40 mg total) into the skin daily.   furosemide 20 MG tablet Commonly known as: LASIX Take 1 tablet (20 mg total) by mouth daily. Start taking on: February 21, 2023   ibuprofen 600 MG tablet Commonly known as: ADVIL Take 1 tablet (600 mg total) by mouth every 6 (six) hours.   insulin glargine 100 UNIT/ML Solostar Pen Commonly known as: LANTUS Inject 12 Units into the skin daily.   labetalol 300 MG tablet Commonly known as: NORMODYNE Take 1 tablet (300 mg total) by mouth 3 (three) times daily. What changed: how much to take   metFORMIN 500 MG tablet Commonly known as:  GLUCOPHAGE Take 1 tablet (500 mg total) by mouth 2 (two) times daily with a meal.   oxyCODONE 5 MG immediate release tablet Commonly known as: Oxy IR/ROXICODONE Take 1-2 tablets (5-10 mg total) by mouth every 6 (six) hours as needed for moderate pain (pain score 4-6).   pantoprazole 40 MG tablet Commonly known as: Protonix Take 1 tablet (40 mg total) by mouth daily.   Pen Needles 32G X 4 MM Misc Use a fresh needle with each insulin administration         Discharge home in stable condition Infant Feeding: Breast Infant  Disposition:NICU Discharge instruction: per After Visit Summary and Postpartum booklet. Activity: Advance as tolerated. Pelvic rest for 6 weeks.  Diet: carb modified diet Future Appointments: Future Appointments  Date Time Provider Department Center  02/28/2023 10:15 AM Anyanwu, Jethro Bastos, MD [facility]  04/05/2023  8:00 AM Tobb, Lavona Mound, DO [facility] None   Follow up Visit:  Follow-up Information     Center for [facility] Follow up in 1 week(s).   Specialty: Obstetrics and Gynecology Contact information: [address] [place] [address] [phone]        Center for [employer] at [facility]. Schedule an appointment as soon as possible for a visit in 5 week(s).   Specialty: Obstetrics and Gynecology Why: postpartum visit with 2 hour GTT Contact information: [address] [place] [address] [phone]                 Please schedule this patient for a In person postpartum visit in 6 weeks with the following provider: MD. Additional Postpartum F/U:Postpartum Depression checkup, Incision check 1 week, and BP check 1 week  High risk pregnancy complicated by: HTN and Bleeding disorder on Lovenox Delivery mode:  C-Section, Low Transverse Anticipated Birth Control:  BTL done PP, APS workup postpartum   02/20/2023 Warden Fillers, MD

## 2023-02-16 NOTE — Inpatient Diabetes Management (Signed)
Inpatient Diabetes Program Recommendations  AACE/ADA: New Consensus Statement on Inpatient Glycemic Control (2015)  Target Ranges:  Prepandial:   less than 140 mg/dL      Peak postprandial:   less than 180 mg/dL (1-2 hours)      Critically ill patients:  140 - 180 mg/dL   Lab Results  Component Value Date   GLUCAP 191 (H) 02/16/2023   HGBA1C 5.8 (H) 09/22/2022    Inpatient Diabetes Program Recommendations:    Patient is going for C/S.  Please discontinue insulins post-partum.  Check CBGs ac/hs.  Will continue to follow while inpatient.  Thank you, Dulce Sellar, MSN, CDCES Diabetes Coordinator Inpatient Diabetes Program [phone] (team pager from 8a-5p)

## 2023-02-16 NOTE — Progress Notes (Signed)
Patient received her second betamethasone dose at 0600 today.  She has been seen by Neonatology.  Continue NPO for now, patient still reports persistent headache.   Jaynie Collins, MD, FACOG Obstetrician & Gynecologist, [facility] for [employer], [facility]

## 2023-02-16 NOTE — Progress Notes (Signed)
Pt seen this am noting persistent headache 5/10 and now with worsening nausea/vomiting.  States she has had no improvement with any of the medication overnight. Discussed plan to proceed towards delivery today.  The risks of surgery were discussed with the patient including but were not limited to: bleeding which may require transfusion or reoperation; infection which may require antibiotics; injury to bowel, bladder, ureters or other surrounding organs; injury to the fetus; need for additional procedures including hysterectomy in the event of a life-threatening hemorrhage; formation of adhesions; placental abnormalities with subsequent pregnancies; incisional problems; thromboembolic phenomenon and other postoperative/anesthesia complications.  Patient has been NPO since midnight.  Additionally, pt desires permanent sterilization as she does not want another pregnancy ever. Bilateral tubal ligation via salpingectomy reviewed with R&B including but not limited to bleeding, infection, injury to other organs, irreversibility and low failure rate.  Questions answered and pt desires to proceed.  The patient concurred with the proposed plan, giving informed written consent for the procedure.     Anesthesia and OR aware. Preoperative prophylactic antibiotics and SCDs ordered on call to the OR.  To OR when ready. -continue IV Mag -IV Pepcid ordered  Myna Hidalgo, DO Attending Obstetrician & Gynecologist, [facility] for [employer], [facility]

## 2023-02-17 DIAGNOSIS — O114 Pre-existing hypertension with pre-eclampsia, complicating childbirth: Secondary | ICD-10-CM | POA: Diagnosis not present

## 2023-02-17 LAB — CBC
HCT: 29.8 % — ABNORMAL LOW (ref 36.0–46.0)
Hemoglobin: 9.2 g/dL — ABNORMAL LOW (ref 12.0–15.0)
MCH: 25.2 pg — ABNORMAL LOW (ref 26.0–34.0)
MCHC: 30.9 g/dL (ref 30.0–36.0)
MCV: 81.6 fL (ref 80.0–100.0)
Platelets: 443 10*3/uL — ABNORMAL HIGH (ref 150–400)
RBC: 3.65 MIL/uL — ABNORMAL LOW (ref 3.87–5.11)
RDW: 15.5 % (ref 11.5–15.5)
WBC: 17 10*3/uL — ABNORMAL HIGH (ref 4.0–10.5)
nRBC: 2.8 % — ABNORMAL HIGH (ref 0.0–0.2)

## 2023-02-17 LAB — COMPREHENSIVE METABOLIC PANEL
ALT: 13 U/L (ref 0–44)
AST: 20 U/L (ref 15–41)
Albumin: 2.4 g/dL — ABNORMAL LOW (ref 3.5–5.0)
Alkaline Phosphatase: 150 U/L — ABNORMAL HIGH (ref 38–126)
Anion gap: 12 (ref 5–15)
BUN: <5 mg/dL — ABNORMAL LOW (ref 6–20)
CO2: 17 mmol/L — ABNORMAL LOW (ref 22–32)
Calcium: 8.4 mg/dL — ABNORMAL LOW (ref 8.9–10.3)
Chloride: 104 mmol/L (ref 98–111)
Creatinine, Ser: 0.73 mg/dL (ref 0.44–1.00)
GFR, Estimated: >60 mL/min (ref >60)
Glucose, Bld: 350 mg/dL — ABNORMAL HIGH (ref 70–99)
Potassium: 4.1 mmol/L (ref 3.5–5.1)
Sodium: 133 mmol/L — ABNORMAL LOW (ref 135–145)
Total Bilirubin: <0.2 mg/dL (ref <1.2)
Total Protein: 5.4 g/dL — ABNORMAL LOW (ref 6.5–8.1)

## 2023-02-17 LAB — GLUCOSE, CAPILLARY
Glucose-Capillary: 321 mg/dL — ABNORMAL HIGH (ref 70–99)
Glucose-Capillary: 191 mg/dL — ABNORMAL HIGH (ref 70–99)
Glucose-Capillary: 249 mg/dL — ABNORMAL HIGH (ref 70–99)

## 2023-02-17 LAB — RPR: RPR Ser Ql: NONREACTIVE

## 2023-02-17 MED ORDER — TRAZODONE HCL 50 MG PO TABS
50.0000 mg | ORAL_TABLET | Freq: Every day | ORAL | Status: DC
  Administered 2023-02-17 – 2023-02-19 (×3): 50 mg via ORAL
  Filled 2023-02-17 (×4): qty 1

## 2023-02-17 MED ORDER — LURASIDONE HCL 40 MG PO TABS: 40.0000 mg | ORAL_TABLET | Freq: Every day | ORAL | Status: DC

## 2023-02-17 MED ORDER — ALBUTEROL SULFATE (2.5 MG/3ML) 0.083% IN NEBU
3.0000 mL | INHALATION_SOLUTION | Freq: Four times a day (QID) | RESPIRATORY_TRACT | Status: DC | PRN
  Administered 2023-02-17: 3 mL via RESPIRATORY_TRACT
  Filled 2023-02-17: qty 3

## 2023-02-17 MED ORDER — INSULIN ASPART 100 UNIT/ML IJ SOLN
0.0000 [IU] | Freq: Three times a day (TID) | INTRAMUSCULAR | Status: DC
  Administered 2023-02-17: 3 [IU] via SUBCUTANEOUS
  Administered 2023-02-17: 5 [IU] via SUBCUTANEOUS
  Administered 2023-02-17: 11 [IU] via SUBCUTANEOUS
  Administered 2023-02-18 – 2023-02-20 (×4): 2 [IU] via SUBCUTANEOUS

## 2023-02-17 MED ORDER — INFLUENZA VIRUS VACC SPLIT PF (FLUZONE) 0.5 ML IM SUSY
0.5000 mL | PREFILLED_SYRINGE | Freq: Once | INTRAMUSCULAR | Status: AC
  Administered 2023-02-17: 0.5 mL via INTRAMUSCULAR
  Filled 2023-02-17: qty 0.5

## 2023-02-17 MED ORDER — PNEUMOCOCCAL 20-VAL CONJ VACC 0.5 ML IM SUSY
0.5000 mL | PREFILLED_SYRINGE | INTRAMUSCULAR | Status: AC
  Administered 2023-02-18: 0.5 mL via INTRAMUSCULAR
  Filled 2023-02-17: qty 0.5

## 2023-02-17 MED ORDER — SODIUM CHLORIDE 0.9% FLUSH
3.0000 mL | Freq: Two times a day (BID) | INTRAVENOUS | Status: DC
  Administered 2023-02-17 – 2023-02-19 (×5): 3 mL via INTRAVENOUS

## 2023-02-17 MED ORDER — LURASIDONE HCL 40 MG PO TABS
40.0000 mg | ORAL_TABLET | Freq: Every day | ORAL | Status: DC
Start: 2023-02-17 — End: 2023-02-20
  Administered 2023-02-17 – 2023-02-20 (×4): 40 mg via ORAL
  Filled 2023-02-17 (×4): qty 1

## 2023-02-17 MED ORDER — METFORMIN HCL 500 MG PO TABS
500.0000 mg | ORAL_TABLET | Freq: Two times a day (BID) | ORAL | Status: DC
  Administered 2023-02-17 – 2023-02-20 (×7): 500 mg via ORAL
  Filled 2023-02-17 (×7): qty 1

## 2023-02-17 NOTE — Clinical Social Work Maternal (Signed)
CLINICAL SOCIAL WORK MATERNAL/CHILD NOTE  Patient Details  Name: Kimberly Stark MRN: [id number] Date of Birth: Feb 12, 1991  Date:  02/17/2023  Clinical Social Worker Initiating Note:  Blaine Hamper Date/Time: Initiated:  02/17/23/1230     Child's Name:  Kimberly Stark   Biological Parents:  Mother, Father   Need for Interpreter:  None   Reason for Referral:  Behavioral Health Concerns   Address:  [address] [place] [address]    Phone number:  [phone] (home)     Additional phone number: FOB's number is [phone]  Household Members/Support Persons (HM/SP):   Household Member/Support Person 1, Household Member/Support Person 2, Household Member/Support Person 3, Household Member/Support Person 4   HM/SP Name Relationship DOB or Age  HM/SP -1 Zymir Lewis FOB/Husband 10/10/1997  HM/SP -2 Raelynn Kuk daughter 11/23/12  HM/SP -3 Ruschelle Haughton daughter 08/13/2019  HM/SP -4 Baxter Hire daughter 12/30/21  HM/SP -5        HM/SP -6        HM/SP -7        HM/SP -8          Natural Supports (not living in the home):  Extended Family, Parent   Professional Supports: Paramedic (MOB receives outpatient services with the [place])   Employment: Unemployed   Type of Work:     Education:  Counsellor arranged:    Surveyor, quantity Resources:  Media planner    Other Resources:  [employer] (MOB plans to apply for [employer])   Cultural/Religious Considerations Which May Impact Care:  Per [employer] Face Sheet, MOB is [employer].   Strengths:  Ability to meet basic needs  , Home prepared for child  , Understanding of illness, Pediatrician chosen   Psychotropic Medications:         Pediatrician:    [employer] area  Pediatrician List:   [facility]  [place]    Portal      Pediatrician Fax Number:    Risk Factors/Current Problems:  Mental Health Concerns      Cognitive State:  Alert  , Insightful  , Goal Oriented  , Able to Concentrate  , Linear Thinking     Mood/Affect:  Comfortable  , Interested  , Calm  , Happy  , Relaxed     CSW Assessment: CSW received consult due to MOB's 13 Edinburgh Score, history of anxiety and Bipolar Disorder I. CSW met with MOB at bedside to complete assessment. When CSW entered room, MOB and FOB were resting on the couch and in the bed, and infant was in the NICU.  CSW introduced self and explained reasons for consult. MOB was agreeable to consult and provided verbal consent for SW to complete assessment while FOB was present. FOB engaged with SW and appeared to be a valued support for MOB and family.  CSW inquired how MOB felt emotionally since giving birth. MOB shared she is doing good since having the Mag discontinued and she is looking forward to spending time with infant in NICU. CSW reviewed MOB's Edinburgh Score and MH hx.  MOB reports she is connected with a psychiatrist  and a therapist both through the [place]. MOB shared [facility].  However, MOB communicated she just spoke with [facility] provider and they agreed to initiate medication while MOB is inpt.  MOB was unable  to recall the name of the medications.    MOB reports she was diagnosed with Major Depressive Disorder, PTSD, anxiety, and panic disorder in 2016. MOB reports she experienced postpartum depression after giving birth to her 34 year old and 34 year old. MOB shares she was diagnosed with Bipolar I Disorder in 2012. MOB recalls she was hospitalized at [facility] psychiatric hospital after she had her 2 year.  MOB reports a history of suicidal ideation, reporting she has not endorsed SI since 2018. MOB reports she has taken Seroquel, Xanax, and quetiapine for management of mental health symptoms in the past.  CSW provided education regarding the baby blues period vs. perinatal mood disorders, discussed treatment and gave  resources for mental health follow up if concerns arise.  CSW recommends self-evaluation during the postpartum time period using the New Mom Checklist from Postpartum Progress and encouraged MOB to contact a medical professional if symptoms are noted at any time.  CSW assessed for safety and MOB denied HI and SI.  MOB reports having a good support team and she expressed feeling comfortable seeking help if needed.   MOB reports she has all needed items for infant, including a car seat and she and FOB are confident they will get bassinet for baby post d/c form the NICU.  The couple is aware to inform NICU SW if they are not have to get a bassinet.  Per the couple they feel well informed by NICU team and they denied having any barriers to visiting with infant post MOB's d/c.    CSW Plan/Description:  Psychosocial Support and Ongoing Assessment of Needs, Sudden Infant Death Syndrome (SIDS) Education, Perinatal Mood and Anxiety Disorder (PMADs) Education, Other Information/Referral to [employer], MSW, [employer] Clinical Social Work [phone]   Barbara Cower, LCSW 02/17/2023, 1:04 PM

## 2023-02-17 NOTE — Progress Notes (Addendum)
POSTPARTUM PROGRESS NOTE  POD #1  Subjective:  Kimberly Stark is a 32 y.o. [id number] s/p rLTCS and bilateral salpingectomy at [redacted]w[redacted]d. Today she notes that she is feeling much better.  Her headache has now resolved.  She denies blurry vision or right upper quadrant pain.  She did have 1 episode of diarrhea yesterday and thinks it is due to the metformin.  She denies any problems with ambulating, voiding or po intake. Denies nausea or vomiting.  Pain is well-controlled.  She noted some small clots yesterday, but bleeding seems to improved today.  She overall is doing well and reports no acute complaints. Denies fever/chills/chest pain/SOB.  Objective: Blood pressure 137/79, pulse 83, temperature 97.7 F (36.5 C), temperature source Oral, resp. rate 18, SpO2 98%, unknown if currently breastfeeding.  Physical Exam:  General: alert, cooperative and no distress Chest: no respiratory distress, CTAB Heart: regular rate and rhythm Abdomen: soft, nontender, +BS Uterine Fundus: firm, appropriately tender Incision: clean and dry with pressure dressing DVT Evaluation: No calf swelling or tenderness Extremities: no edema Skin: warm, dry  Results for orders placed or performed during the hospital encounter of 02/14/23 (from the past 24 hour(s))  Glucose, capillary     Status: Abnormal   Collection Time: 02/16/23 10:13 AM  Result Value Ref Range   Glucose-Capillary 191 (H) 70 - 99 mg/dL  Glucose, capillary     Status: Abnormal   Collection Time: 02/16/23 12:11 PM  Result Value Ref Range   Glucose-Capillary 191 (H) 70 - 99 mg/dL  CBC     Status: Abnormal   Collection Time: 02/16/23  2:11 PM  Result Value Ref Range   WBC 16.5 (H) 4.0 - 10.5 K/uL   RBC 3.80 (L) 3.87 - 5.11 MIL/uL   Hemoglobin 9.8 (L) 12.0 - 15.0 g/dL   HCT 64.4 (L) 03.4 - 74.2 %   MCV 80.5 80.0 - 100.0 fL   MCH 25.8 (L) 26.0 - 34.0 pg   MCHC 32.0 30.0 - 36.0 g/dL   RDW 59.5 63.8 - 75.6 %   Platelets 405 (H) 150 - 400 K/uL    nRBC 1.8 (H) 0.0 - 0.2 %  Rapid HIV screen (HIV 1/2 Ab+Ag)     Status: None   Collection Time: 02/16/23  2:11 PM  Result Value Ref Range   HIV-1 P24 Antigen - HIV24 NON REACTIVE NON REACTIVE   HIV 1/2 Antibodies NON REACTIVE NON REACTIVE   Interpretation (HIV Ag Ab)      A non reactive test result means that HIV 1 or HIV 2 antibodies and HIV 1 p24 antigen were not detected in the specimen.  Creatinine, serum     Status: None   Collection Time: 02/16/23  2:11 PM  Result Value Ref Range   Creatinine, Ser 0.57 0.44 - 1.00 mg/dL   GFR, Estimated >43 >32 mL/min  CBC     Status: Abnormal   Collection Time: 02/17/23  5:09 AM  Result Value Ref Range   WBC 17.0 (H) 4.0 - 10.5 K/uL   RBC 3.65 (L) 3.87 - 5.11 MIL/uL   Hemoglobin 9.2 (L) 12.0 - 15.0 g/dL   HCT 95.1 (L) 88.4 - 16.6 %   MCV 81.6 80.0 - 100.0 fL   MCH 25.2 (L) 26.0 - 34.0 pg   MCHC 30.9 30.0 - 36.0 g/dL   RDW 06.3 01.6 - 01.0 %   Platelets 443 (H) 150 - 400 K/uL   nRBC 2.8 (H) 0.0 - 0.2 %  Comprehensive  metabolic panel     Status: Abnormal   Collection Time: 02/17/23  5:09 AM  Result Value Ref Range   Sodium 133 (L) 135 - 145 mmol/L   Potassium 4.1 3.5 - 5.1 mmol/L   Chloride 104 98 - 111 mmol/L   CO2 17 (L) 22 - 32 mmol/L   Glucose, Bld 350 (H) 70 - 99 mg/dL   BUN <5 (L) 6 - 20 mg/dL   Creatinine, Ser 7.84 0.44 - 1.00 mg/dL   Calcium 8.4 (L) 8.9 - 10.3 mg/dL   Total Protein 5.4 (L) 6.5 - 8.1 g/dL   Albumin 2.4 (L) 3.5 - 5.0 g/dL   AST 20 15 - 41 U/L   ALT 13 0 - 44 U/L   Alkaline Phosphatase 150 (H) 38 - 126 U/L   Total Bilirubin <0.2 <1.2 mg/dL   GFR, Estimated >69 >62 mL/min   Anion gap 12 5 - 15  Glucose, capillary     Status: Abnormal   Collection Time: 02/17/23  6:36 AM  Result Value Ref Range   Glucose-Capillary 321 (H) 70 - 99 mg/dL    Assessment/Plan: Kimberly Stark is a 32 y.o. [id number] s/p rLTCS and bilateral salpingectomy at [redacted]w[redacted]d POD#1 complicated by: 1) superimposed preeclampsia -Currently on mag x  24 hours -Labetalol 200 3 times daily, Norvasc 5 mg daily, BP appropriate -Patient asymptomatic, labs stable  2) Gestational Diabetes -Accu-Cheks are significantly elevated -Currently on metformin and sliding scale insulin, will continue to closely follow  3)Postop care -Pain well-controlled -Encourage ambulation -Will continue Lovenox x 6 weeks due to history of PE -Restarted Latuda and trazodone, which she has taken in the past when breast-feeding -baby in NICU  Contraception: salpingectomy Feeding: breast  Dispo: Continue postop care as outlined above   LOS: 3 days   Myna Hidalgo, DO Faculty Attending, Center for [facility] 02/17/2023, 9:36 AM

## 2023-02-17 NOTE — Lactation Note (Signed)
This note was copied from a baby'Stark chart.  NICU Lactation Consultation Note  Patient Name: Kimberly Stark [id number] Date: 02/17/2023 Age:34 hours  Reason for consult: NICU baby; Preterm <34wks; Other (Comment); Initial assessment; Maternal endocrine disorder; Infant < 6lbs (cHTN, severe Pre-E) Type of Endocrine Disorder?: Diabetes (GDMA2 (insulin))  SUBJECTIVE Visited with family of 83 hours old pre-term NICU female; Kimberly Stark is a P4 and has some experienced breastfeeding. She voiced she planning for breastmilk exclusivity at least  for the first few months, her first baby was also in NICU and developed NEC due to formula, she'Stark now 32 y.o and still having G.I issues. Kimberly Stark has already been set up with a DEBP by her RN Kimberly Stark in [facility] but didn't initiate pumping until she got in baby'Stark room. Assisted with breast massage, hand expression and fitting of an XL pumping top prior starting her first pumping session, praised her for her efforts. Assisted with washing her pump pieces when she was done, and provided breastmilk labels, did 2-step verification process with NICU RN Kimberly Stark. Reviewed pumping schedule, pumping log, pumping diary, pump settings, lactogenesis II and anticipatory guidelines  OBJECTIVE Infant data: Mother'Stark Current Feeding Choice: Breast Milk and Donor Milk  O2 Device: Room Air FiO2 (%): 21 %  Infant feeding assessment Scale for Readiness: 2   Maternal data: [id number] Stark-Section, Low Transverse Has patient been taught Hand Expression?: Yes Hand Expression Comments: small droplet of colostrum noted Significant Breast History:: (+) breast changes during the pregnancy Current breast feeding challenges:: NICU admission Previous breastfeeding challenges?: Infant separation (NICU admission with first baby) Does the patient have breastfeeding experience prior to this delivery?: Yes How long did the patient breastfeed?: 3-4 months Pumping frequency: initated pumping at 26  hours post-partum Pumped volume: 4 mL Flange Size: 24 Hands-free pumping top sizes: X-Large (Green) Risk factor for low/delayed milk supply:: prematurity, cHTN, severe Pre-E, on Mag+, GDMA2, < 6 lbs  WIC Program: Yes WIC Referral Sent?: Yes What county?: [place]  ASSESSMENT Infant: Feeding Status: Scheduled 8-11-2-5 Feeding method: Tube/Gavage (Bolus)  Maternal: Milk volume: Normal  INTERVENTIONS/PLAN Interventions: Interventions: Breast feeding basics reviewed; Breast massage; Hand express; Coconut oil; DEBP; Education; [employer] brochure; CDC Guidelines for Breast Pump Cleaning; NICU Pumping Log Tools: Pump; Flanges; Coconut oil; Hands-free pumping top Pump Education: Setup, frequency, and cleaning; Milk Storage  Plan: Encouraged pumping every 3 hours, ideally 8 pumping sessions/24 hours Breast massage, hand expression and coconut oil were also encouraged prior pumping STS as much as possible  FOB present and supportive. All questions and concerns answered, family to contact [facility] services PRN.  Consult Status: NICU follow-up NICU Follow-up type: New admission follow up   Kimberly Stark Kimberly Stark 02/17/2023, 2:41 PM

## 2023-02-17 NOTE — Lactation Note (Signed)
This note was copied from a baby's chart. Lactation Consultation Note  Patient Name: Kimberly Stark [id number] Date: 02/17/2023 Age:34 hours   Attempted to visit with Ms. Beld but she was in the shower. NICU LC to F/U later for initial assessment, she has already been set up with a DEBP and started pumping today.   Dreyton Roessner S Seira Cody 02/17/2023, 12:07 PM

## 2023-02-18 DIAGNOSIS — O114 Pre-existing hypertension with pre-eclampsia, complicating childbirth: Secondary | ICD-10-CM | POA: Diagnosis not present

## 2023-02-18 LAB — TYPE AND SCREEN
ABO/RH(D): O POS
Antibody Screen: NEGATIVE
Donor AG Type: NEGATIVE
Donor AG Type: NEGATIVE
Unit division: 0
Unit division: 0

## 2023-02-18 LAB — GLUCOSE, CAPILLARY
Glucose-Capillary: 157 mg/dL — ABNORMAL HIGH (ref 70–99)
Glucose-Capillary: 127 mg/dL — ABNORMAL HIGH (ref 70–99)
Glucose-Capillary: 131 mg/dL — ABNORMAL HIGH (ref 70–99)
Glucose-Capillary: 100 mg/dL — ABNORMAL HIGH (ref 70–99)
Glucose-Capillary: 156 mg/dL — ABNORMAL HIGH (ref 70–99)

## 2023-02-18 LAB — BPAM RBC
Blood Product Expiration Date: 202412272359
Blood Product Expiration Date: 202412272359
Unit Type and Rh: 5100
Unit Type and Rh: 5100

## 2023-02-18 MED ORDER — CAFFEINE 200 MG PO TABS
200.0000 mg | ORAL_TABLET | Freq: Four times a day (QID) | ORAL | Status: DC | PRN
  Administered 2023-02-18 – 2023-02-19 (×2): 200 mg via ORAL
  Filled 2023-02-18 (×3): qty 1

## 2023-02-18 MED ORDER — AMLODIPINE BESYLATE 5 MG PO TABS
10.0000 mg | ORAL_TABLET | Freq: Every day | ORAL | Status: DC
  Administered 2023-02-18 – 2023-02-20 (×3): 10 mg via ORAL
  Filled 2023-02-18 (×3): qty 2

## 2023-02-18 MED ORDER — GABAPENTIN 100 MG PO CAPS
200.0000 mg | ORAL_CAPSULE | Freq: Once | ORAL | Status: AC
Start: 2023-02-18 — End: 2023-02-18
  Administered 2023-02-18: 200 mg via ORAL
  Filled 2023-02-18: qty 2

## 2023-02-18 MED ORDER — FUROSEMIDE 20 MG PO TABS
20.0000 mg | ORAL_TABLET | Freq: Every day | ORAL | Status: DC
  Administered 2023-02-18 – 2023-02-20 (×3): 20 mg via ORAL
  Filled 2023-02-18 (×3): qty 1

## 2023-02-18 MED ORDER — INSULIN GLARGINE-YFGN 100 UNIT/ML ~~LOC~~ SOLN
12.0000 [IU] | Freq: Every day | SUBCUTANEOUS | Status: DC
Start: 2023-02-18 — End: 2023-02-20
  Administered 2023-02-18 – 2023-02-20 (×3): 12 [IU] via SUBCUTANEOUS
  Filled 2023-02-18 (×3): qty 0.12

## 2023-02-18 MED ORDER — METOCLOPRAMIDE HCL 10 MG PO TABS
5.0000 mg | ORAL_TABLET | Freq: Three times a day (TID) | ORAL | Status: DC | PRN
  Administered 2023-02-18: 5 mg via ORAL
  Filled 2023-02-18 (×2): qty 1

## 2023-02-18 MED ORDER — GABAPENTIN 300 MG PO CAPS
300.0000 mg | ORAL_CAPSULE | Freq: Two times a day (BID) | ORAL | Status: DC
  Administered 2023-02-18 – 2023-02-20 (×4): 300 mg via ORAL
  Filled 2023-02-18 (×4): qty 1

## 2023-02-18 MED ORDER — NIFEDIPINE ER OSMOTIC RELEASE 30 MG PO TB24: 30.0000 mg | ORAL_TABLET | Freq: Two times a day (BID) | ORAL | Status: DC

## 2023-02-18 NOTE — Progress Notes (Signed)
Faculty Note  Down to assess patient's headache, she reports it is similar to the headache she had when she came in, she thinks it is due to her blood pressures being slightly elevated from yesterday.  Mild range BP today with lasix on board, will likely improve as she diureses. Increased gabapentin with no improvement, will try caffeine and then low dose reglan if no improvement in headache, patient agreeable to plan  K. Therese Sarah, MD, [facility] Attending Center for [facility])

## 2023-02-18 NOTE — Progress Notes (Signed)
Subjective: Postpartum Day 2: Cesarean Delivery Patient reports incisional pain, tolerating PO, and no problems voiding.    Objective: Vital signs in last 24 hours: Temp:  [97.7 F (36.5 C)-98.7 F (37.1 C)] 98.7 F (37.1 C) (12/08 0446) Pulse Rate:  [74-88] 87 (12/08 0446) Resp:  [16-18] 18 (12/08 0446) BP: (124-158)/(57-79) 158/67 (12/08 0446) SpO2:  [98 %-100 %] 100 % (12/08 0446)  Intake/Output Summary (Last 24 hours) at 02/18/2023 0746 Last data filed at 02/18/2023 0300 Gross per 24 hour  Intake 2591.48 ml  Output 2575 ml  Net 16.48 ml    Physical Exam:  General: alert, cooperative, and appears stated age 32: appropriate Uterine Fundus: firm Incision: healing well DVT Evaluation: No evidence of DVT seen on physical exam.  Recent Labs    02/16/23 1411 02/17/23 0509  HGB 9.8* 9.2*  HCT 30.6* 29.8*   Results for orders placed or performed during the hospital encounter of 02/14/23 (from the past 24 hour(s))  Glucose, capillary     Status: Abnormal   Collection Time: 02/17/23  3:03 PM  Result Value Ref Range   Glucose-Capillary 191 (H) 70 - 99 mg/dL  Glucose, capillary     Status: Abnormal   Collection Time: 02/17/23  7:44 PM  Result Value Ref Range   Glucose-Capillary 249 (H) 70 - 99 mg/dL  Glucose, capillary     Status: Abnormal   Collection Time: 02/18/23  4:48 AM  Result Value Ref Range   Glucose-Capillary 157 (H) 70 - 99 mg/dL    Assessment/Plan: Status post Cesarean section. Postoperative course complicated by pre-eclampsia on amlodipine increased to 10 mg Continue labetalol   On lasix CBGs are better today on metformin and SSI Continue current care.  Reva Bores, MD 02/18/2023, 7:43 AM

## 2023-02-19 ENCOUNTER — Other Ambulatory Visit: Payer: No Typology Code available for payment source

## 2023-02-19 ENCOUNTER — Inpatient Hospital Stay (HOSPITAL_COMMUNITY): Payer: No Typology Code available for payment source

## 2023-02-19 ENCOUNTER — Ambulatory Visit: Payer: No Typology Code available for payment source

## 2023-02-19 DIAGNOSIS — R519 Headache, unspecified: Secondary | ICD-10-CM

## 2023-02-19 LAB — GLUCOSE, CAPILLARY
Glucose-Capillary: 123 mg/dL — ABNORMAL HIGH (ref 70–99)
Glucose-Capillary: 116 mg/dL — ABNORMAL HIGH (ref 70–99)
Glucose-Capillary: 137 mg/dL — ABNORMAL HIGH (ref 70–99)
Glucose-Capillary: 105 mg/dL — ABNORMAL HIGH (ref 70–99)
Glucose-Capillary: 122 mg/dL — ABNORMAL HIGH (ref 70–99)
Glucose-Capillary: 131 mg/dL — ABNORMAL HIGH (ref 70–99)
Glucose-Capillary: 160 mg/dL — ABNORMAL HIGH (ref 70–99)

## 2023-02-19 MED ORDER — DIPHENHYDRAMINE HCL 50 MG/ML IJ SOLN
12.5000 mg | Freq: Once | INTRAMUSCULAR | Status: AC
  Administered 2023-02-19: 12.5 mg via INTRAVENOUS
  Filled 2023-02-19: qty 1

## 2023-02-19 MED ORDER — LABETALOL HCL 200 MG PO TABS
300.0000 mg | ORAL_TABLET | Freq: Two times a day (BID) | ORAL | Status: DC
  Administered 2023-02-19 – 2023-02-20 (×3): 300 mg via ORAL
  Filled 2023-02-19 (×3): qty 1

## 2023-02-19 MED ORDER — PROCHLORPERAZINE EDISYLATE 10 MG/2ML IJ SOLN
10.0000 mg | Freq: Once | INTRAMUSCULAR | Status: AC
  Administered 2023-02-19: 10 mg via INTRAVENOUS
  Filled 2023-02-19: qty 2

## 2023-02-19 MED ORDER — LORAZEPAM 1 MG PO TABS: 2.0000 mg | ORAL_TABLET | Freq: Once | ORAL | Status: DC | PRN

## 2023-02-19 NOTE — Progress Notes (Signed)
NEUROLOGY CONSULT NOTE   Date of service: February 19, 2023 Patient Name: Kimberly Stark MRN:  [id number] DOB:  08/13/1990 Chief Complaint: "headache and SIPE" Requesting Provider: Federico Flake, *  History of Present Illness  Kimberly Stark is a 32 y.o. 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 who presents with  headache and SIPE She postpartum day 2. She states she has a continued headache which had prompted her to the be evaluated on 12/4.  She states she has a history of migraines and takes topamax for this, and has a chiari malformation which causes her to have headaches in the back of her head. She states that this current headache is not like any of her typical migraines or normal headaches she gets. She states that the headache runs across the front of her head from left to right with constant pressure pushing on front of head. She endorses having photophobia. No vomiting or nausea. Currently rates the pain 5-6/10. She states nothing that she has been given has helped with the headache. She is attributing her headaches to hypertension. BP has been SBP 140-150's.    ROS   Comprehensive ROS performed and pertinent positives documented in HPI    Past History   Past Medical History:  Diagnosis Date   Anemia    Asthma    Bipolar 1 disorder (HCC)    Blood transfusion without reported diagnosis    Chiari malformation type I (HCC)    Chronic post-traumatic stress  disorder 09/19/2021   Glaucoma 09/19/2021   Heart murmur    History of postpartum hemorrhage    History of pulmonary embolism 2021   in setting of covid   Insomnia 09/19/2021   Migraine with typical aura 09/19/2021   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/2021   Pregnancy induced hypertension    Preterm labor    Sickle cell trait (HCC)    UTI (urinary tract infection)    Vaginal Pap smear, abnormal     Past Surgical History:  Procedure Laterality Date   CERVICAL BIOPSY  W/ LOOP ELECTRODE EXCISION     CERVICAL CERCLAGE     CESAREAN SECTION     CESAREAN SECTION N/A 12/30/2021   Procedure: CESAREAN SECTION;  Surgeon: Hermina Staggers, MD;  Location: [facility] LD ORS;  Service: Obstetrics;  Laterality: N/A;   CESAREAN SECTION N/A 02/16/2023   Procedure: CESAREAN SECTION;  Surgeon: Milas Hock, MD;  Location: [facility] LD ORS;  Service: Obstetrics;  Laterality: N/A;   DILATION AND CURETTAGE OF UTERUS     TUBAL LIGATION Bilateral 02/16/2023   Procedure: BILATERAL TUBAL LIGATION;  Surgeon: Milas Hock, MD;  Location: [facility] LD ORS;  Service: Obstetrics;  Laterality: Bilateral;    Family History: Family History  Problem Relation Age of Onset   Stroke Mother    Heart disease Mother        hx open heart issues   Hypertension Mother    Diabetes Mother    Cancer Father        started as colon, w/mets   Kidney disease Father  renal failure   Diabetes Father    Cancer Maternal Grandmother    Diabetes Paternal Grandmother     Social History  reports that she has been smoking cigarettes. She has never used smokeless tobacco. She reports that she does not currently use alcohol. She reports that she does not currently use drugs.  Allergies  Allergen Reactions   Iodinated Contrast Media Shortness Of Breath, Rash and Swelling   Latex Anaphylaxis   Shellfish Allergy Anaphylaxis   Iodine Hives   Penicillins Hives    Medications   Current Facility-Administered Medications:     acetaminophen (TYLENOL) tablet 1,000 mg, 1,000 mg, Oral, Q6H, Milas Hock, MD, 1,000 mg at 02/19/23 0640   albuterol (PROVENTIL) (2.5 MG/3ML) 0.083% nebulizer solution 3 mL, 3 mL, Inhalation, Q6H PRN, Ozan, Jennifer, DO, 3 mL at 02/17/23 1012   amLODipine (NORVASC) tablet 10 mg, 10 mg, Oral, Daily, Reva Bores, MD, 10 mg at 02/19/23 1027   caffeine tablet 200 mg, 200 mg, Oral, Q6H PRN, Conan Bowens, MD, 200 mg at 02/19/23 0648   coconut oil, 1 Application, Topical, PRN, Milas Hock, MD, 1 Application at 02/17/23 1103   witch hazel-glycerin (TUCKS) pad 1 Application, 1 Application, Topical, PRN **AND** dibucaine (NUPERCAINAL) 1 % rectal ointment 1 Application, 1 Application, Rectal, PRN, Milas Hock, MD   diphenhydrAMINE (BENADRYL) capsule 25 mg, 25 mg, Oral, Q6H PRN, Milas Hock, MD, 25 mg at 02/16/23 2316   enoxaparin (LOVENOX) injection 50 mg, 50 mg, Subcutaneous, Q24H, Milas Hock, MD, 50 mg at 02/18/23 2219   furosemide (LASIX) tablet 20 mg, 20 mg, Oral, Daily, Reva Bores, MD, 20 mg at 02/19/23 1027   gabapentin (NEURONTIN) capsule 300 mg, 300 mg, Oral, BID, Conan Bowens, MD, 300 mg at 02/19/23 1027   HYDROmorphone (DILAUDID) injection 0.2-0.6 mg, 0.2-0.6 mg, Intravenous, Q2H PRN, Milas Hock, MD   [COMPLETED] ketorolac (TORADOL) 30 MG/ML injection 30 mg, 30 mg, Intravenous, Q6H, 30 mg at 02/17/23 0828 **FOLLOWED BY** ibuprofen (ADVIL) tablet 600 mg, 600 mg, Oral, Q6H, Milas Hock, MD, 600 mg at 02/19/23 0640   insulin aspart (novoLOG) injection 0-15 Units, 0-15 Units, Subcutaneous, TID WC, Reva Bores, MD, 2 Units at 02/18/23 1450   insulin glargine-yfgn (SEMGLEE) injection 12 Units, 12 Units, Subcutaneous, Daily, Ozan, Jennifer, DO, 12 Units at 02/19/23 1028   labetalol (NORMODYNE) tablet 300 mg, 300 mg, Oral, BID, Reva Bores, MD, 300 mg at 02/19/23 1027   lurasidone (LATUDA) tablet 40 mg, 40 mg, Oral, Q breakfast, Federico Flake, MD, 40 mg at 02/19/23  7106   menthol-cetylpyridinium (CEPACOL) lozenge 3 mg, 1 lozenge, Oral, Q2H PRN, Milas Hock, MD   metFORMIN (GLUCOPHAGE) tablet 500 mg, 500 mg, Oral, BID WC, Reva Bores, MD, 500 mg at 02/19/23 0829   metoCLOPramide (REGLAN) tablet 5 mg, 5 mg, Oral, Q8H PRN, Conan Bowens, MD, 5 mg at 02/18/23 2322   oxyCODONE (Oxy IR/ROXICODONE) immediate release tablet 5-10 mg, 5-10 mg, Oral, Q4H PRN, Milas Hock, MD, 10 mg at 02/19/23 2694   prenatal multivitamin tablet 1 tablet, 1 tablet, Oral, Q1200, Milas Hock, MD, 1 tablet at 02/18/23 1236   senna-docusate (Senokot-S) tablet 2 tablet, 2 tablet, Oral, Daily, Milas Hock, MD, 2 tablet at 02/19/23 1027   simethicone (MYLICON) chewable tablet 80 mg, 80 mg, Oral, TID PC, Milas Hock, MD, 80 mg at 02/19/23 1027   simethicone (MYLICON) chewable tablet 80 mg, 80 mg, Oral, PRN, Milas Hock, MD   sodium  chloride flush (NS) 0.9 % injection 3 mL, 3 mL, Intravenous, Q12H, Conan Bowens, MD, 3 mL at 02/19/23 1028   traZODone (DESYREL) tablet 50 mg, 50 mg, Oral, QHS, Ozan, Jennifer, DO, 50 mg at 02/18/23 2220   zolpidem (AMBIEN) tablet 5 mg, 5 mg, Oral, QHS PRN, Milas Hock, MD  Vitals   Vitals:   02/18/23 2220 02/18/23 2318 02/19/23 0555 02/19/23 0809  BP:  (!) 149/73 (!) 148/73 (!) 158/77  Pulse: 90 90 82 88  Resp:  17 18 19   Temp:  98.4 F (36.9 C) 98.4 F (36.9 C) 98.1 F (36.7 C)  TempSrc:  Oral Oral Oral  SpO2:  100% 98% 99%    There is no height or weight on file to calculate BMI.  Physical Exam   Constitutional: Appears well-developed and well-nourished.   Psych: Affect appropriate to situation.   Eyes: No scleral injection.   HENT: No OP obstruction.   Head: Normocephalic.   Cardiovascular: Normal rate and regular rhythm.   Respiratory: Effort normal, non-labored breathing.   GI: Soft.  No distension. There is no tenderness.   Skin: WDI.    Neurologic Examination    Mental Status -  Level of arousal and orientation  to time, place, and person were intact. Language including expression, naming, repetition, comprehension was assessed and found intact. Attention span and concentration were normal. Recent and remote memory were intact. Fund of Knowledge was assessed and was intact.  Cranial Nerves II - XII - II - Visual field intact OU. III, IV, VI - Extraocular movements intact. V - Facial sensation intact bilaterally. VII - Facial movement intact bilaterally. VIII - Hearing & vestibular intact bilaterally. X - Palate elevates symmetrically. XI - Chin turning & shoulder shrug intact bilaterally. XII - Tongue protrusion intact.  Motor Strength - The patient's strength was normal in all extremities and pronator drift was absent.  Bulk was normal and fasciculations were absent.   Motor Tone - Muscle tone was assessed at the neck and appendages and was normal. Sensory - Light touch, temperature/pinprick were assessed and were symmetrical.   Coordination - The patient had normal movements in the hands and feet with no ataxia or dysmetria.  Tremor was absent. Gait and Station - deferred.  Labs/Imaging/Neurodiagnostic studies   CBC:  Recent Labs  Lab March 18, 2023 1411 02/17/23 0509  WBC 16.5* 17.0*  HGB 9.8* 9.2*  HCT 30.6* 29.8*  MCV 80.5 81.6  PLT 405* 443*    Basic Metabolic Panel:  Lab Results  Component Value Date   NA 133 (L) 02/17/2023   K 4.1 02/17/2023   CO2 17 (L) 02/17/2023   GLUCOSE 350 (H) 02/17/2023   BUN <5 (L) 02/17/2023   CREATININE 0.73 02/17/2023   CALCIUM 8.4 (L) 02/17/2023   GFRNONAA >60 02/17/2023    Lipid Panel: No results found for: "LDLCALC"  HgbA1c:  Lab Results  Component Value Date   HGBA1C 5.8 (H) 09/22/2022     Gevena Mart DNP, ACNPC-AG  [facility]  I have seen the patient and reviewed the above note.  ASSESSMENT   Kimberly Stark is a 32 y.o. female with a history of Chiari malformation as well as chronic headaches who presents with  bifrontal pressure type headache associated with photophobia.  Given the characteristic location and description, my suspicion is that this does represent migraine and I would treated as such for now.  I would start with Compazine and Benadryl and assess response.   RECOMMENDATIONS  Will give 12.5mg  IV benadryl and compazine 10mg  x 1 now  If continues to have symptoms, could consider Depacon 1 g and repeating Compazine ______________________________________________________________________  Ritta Slot, MD [facility] [phone]  If 7pm- 7am, please page neurology on call as listed in AMION.

## 2023-02-19 NOTE — Inpatient Diabetes Management (Signed)
Inpatient Diabetes Program Recommendations  AACE/ADA: New Consensus Statement on Inpatient Glycemic Control (2015)  Target Ranges:  Prepandial:   less than 140 mg/dL      Peak postprandial:   less than 180 mg/dL (1-2 hours)      Critically ill patients:  140 - 180 mg/dL   Lab Results  Component Value Date   GLUCAP 137 (H) 02/19/2023   HGBA1C 5.8 (H) 09/22/2022    Review of Glycemic Control  Latest Reference Range & Units 02/18/23 17:51 02/18/23 22:26 02/19/23 05:56 02/19/23 09:17 02/19/23 11:22  Glucose-Capillary 70 - 99 mg/dL 295 (H) 621 (H) 308 (H) 116 (H) 137 (H)  (H): Data is abnormally high Diabetes history: PreDM, GDM Outpatient Diabetes medications: Metformin 500 mg BID Current orders for Inpatient glycemic control: Novolog 0-15 units TID, Semglee 12 units every day, Metformin 500 mg BID  Inpatient Diabetes Program Recommendations:   In preparation for discharge recommend continuing with CBG monitoring and Metformin 500 mg BID. Insulin needs have been more resistant, however patient has also received Decadron and BMZ x 2 on 02/16/23.   Spoke with patient regarding outpatient diabetes management. Patient has never used insulin prior to hospitalization.  Reviewed patient's previous A1c of 5.8% and accuracy in pregnancy. Explained what a A1c is and what it measures. Also reviewed goal A1c with patient, importance of good glucose control @ home, and blood sugar goals. Reviewed patho of DM, differences between GDM vs PreDM and risk for developing Type 2 DM, survival skills, interventions, standards for CBG monitoring, stress response and associated glucose trends, differences between long acting insulin and oral medications, insulin pens, vascular changes and commorbidities.  Encouraged patient to continue checking CBGs. Reviewed Dexcom G7 with patient.  Patient has headache so wanting to wait until 12/10 to apply.  Educated patient on insulin pen use at home. Reviewed contents of  insulin flexpen starter kit. Reviewed all steps if insulin pen including attachment of needle, 2-unit air shot, dialing up dose, giving injection, removing needle, disposal of sharps, storage of unused insulin, disposal of insulin etc. Also reviewed troubleshooting with insulin pen. MD to give patient Rxs for insulin pens and insulin pen needles. Will further demonstrate on 12/10. Of note: Patient given Lovenox injections in pregnancy.  No further questions at this time.  [facility]   Thanks, Lujean Rave, MSN, RNC-OB Diabetes Coordinator [phone] (8a-5p)

## 2023-02-19 NOTE — Lactation Note (Signed)
This note was copied from a baby's chart.  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  Baby is currently on scheduled gavage feedings of EBM+/DBM and on phototherapy  Mom has been suffering with a headache.  Neuro consult done this am and Rx of Compazine prescribed.    LC provided Mom with another pumping top as her other went home to be washed.  LC assisted Mom to pump on initiation setting.  Encouraged hands on pumping.  Mom hasn't gotten a call from [facility] yet.  LC will send another message to them to inquire about offering Mom a pump loaner on discharge.  No data recorded O2 Device: Room Air  Infant feeding assessment Scale for Readiness: 2   Maternal data: [id number] C-Section, Low Transverse Pumping frequency: every 3 hrs Pumped volume: 15 mL Flange Size: 24 Hands-free pumping top sizes: Large Wallace Cullens)  WIC Program: Yes WIC Referral Sent?: Yes What county?: [place] Pump:  (advised Mom to call WIC today)  Feeding Status: Scheduled 8-11-2-5 Feeding method: Tube/Gavage (Bolus)  Maternal: Milk volume: Normal  INTERVENTIONS/PLAN Interventions: Discharge Education: Engorgement and breast care Tools: Pump; Flanges; Hands-free pumping top Pump Education: Setup, frequency, and cleaning; Milk Storage  Plan: Consult Status: NICU follow-up NICU Follow-up type: Maternal D/C visit; Verify onset of copious milk; Verify absence of engorgement   Judee Clara 02/19/2023, 1:06 PM

## 2023-02-19 NOTE — Progress Notes (Signed)
POSTPARTUM PROGRESS NOTE  POD #3  Subjective:  Kimberly Stark is a 32 y.o. [id number] s/p repeat LTCS at [redacted]w[redacted]d.  She reports she doing well. No acute events overnight. Pt still notes persistent headache.  She states it is different than her usual migraines.  Per pt her usual migraines are more in the occipital region.  These headaches are all over the head.  Multimodal treatment has been attempted with the headaches still present.  The patient  attributes the headaches to increased blood pressure; however, BP is not severe range.  She denies any problems with ambulating, voiding or po intake. Denies nausea or vomiting. She has  passed flatus. Pain is well controlled.  Lochia is normal.  Objective: Blood pressure (!) 158/77, pulse 88, temperature 98.1 F (36.7 C), temperature source Oral, resp. rate 19, SpO2 99%, unknown if currently breastfeeding.  Physical Exam:  General: alert, cooperative and no distress Chest: no respiratory distress, clear top auscultation bilaterally Heart: regular rate and rhythm Abdomen: soft, nontender, nondistended, positive bowel sounds Uterine Fundus: firm, appropriately tender DVT Evaluation: No calf swelling or tenderness Extremities: trace edema Skin: warm, dry; incision clean/dry/intact w/ honeycomb dressing in place, small amount of staining noted. Not not extending past marked borders.  Recent Labs    02/16/23 1411 02/17/23 0509  HGB 9.8* 9.2*  HCT 30.6* 29.8*    Assessment/Plan: Kimberly Stark is a 32 y.o. [id number] s/p repeat cesarean section at [redacted]w[redacted]d for severe preeclampsia.  POD#3 -  Contraception: tubal ligation Feeding: breast  Dispo: anticipate discharge on 02/20/23.   Hypertension:  labetalol increased to 300 mg BID, norvasc at 10 mg daily Will monitor and adjust if needed.  Headaches:  due to history of Arnold-chiari and continued resistance to most interventions, will get neurology consult prior to discharge for  assistance.  Hyperglycemia:  spoke with diabetic coordinator, will continue metformin  BID as well as serial glucose checks.  Lantus ordered previously, will continue with lantus (semglee) for now.    LOS: 5 days   Mariel Aloe, Md Faculty Attending, [facility] [employer] 02/19/2023, 10:25 AM

## 2023-02-19 NOTE — Progress Notes (Signed)
Little response to compazine. I would favor ruling out peripartum CVST with MRV, btu if this is negative, would not pursue other workup.   Ritta Slot, MD [facility] [phone]  If 7pm- 7am, please page neurology on call as listed in AMION.

## 2023-02-20 DIAGNOSIS — O9089 Other complications of the puerperium, not elsewhere classified: Secondary | ICD-10-CM

## 2023-02-20 DIAGNOSIS — R519 Headache, unspecified: Secondary | ICD-10-CM

## 2023-02-20 LAB — GLUCOSE, CAPILLARY
Glucose-Capillary: 135 mg/dL — ABNORMAL HIGH (ref 70–99)
Glucose-Capillary: 124 mg/dL — ABNORMAL HIGH (ref 70–99)

## 2023-02-20 LAB — SURGICAL PATHOLOGY

## 2023-02-20 MED ORDER — INSULIN GLARGINE 100 UNIT/ML SOLOSTAR PEN: 12.0000 [IU] | PEN_INJECTOR | Freq: Every day | SUBCUTANEOUS | 6 refills | Status: DC

## 2023-02-20 MED ORDER — LABETALOL HCL 200 MG PO TABS: 300.0000 mg | ORAL_TABLET | Freq: Three times a day (TID) | ORAL | Status: DC

## 2023-02-20 MED ORDER — METFORMIN HCL 500 MG PO TABS: 500.0000 mg | ORAL_TABLET | Freq: Two times a day (BID) | ORAL | 2 refills | Status: DC

## 2023-02-20 MED ORDER — IBUPROFEN 600 MG PO TABS: 600.0000 mg | ORAL_TABLET | Freq: Four times a day (QID) | ORAL | 1 refills | Status: DC

## 2023-02-20 MED ORDER — PEN NEEDLES 32G X 4 MM MISC: 2 refills | Status: DC

## 2023-02-20 MED ORDER — LABETALOL HCL 300 MG PO TABS: 300.0000 mg | ORAL_TABLET | Freq: Three times a day (TID) | ORAL | 2 refills | Status: DC

## 2023-02-20 MED ORDER — FUROSEMIDE 20 MG PO TABS: 20.0000 mg | ORAL_TABLET | Freq: Every day | ORAL | 0 refills | Status: DC

## 2023-02-20 MED ORDER — AMLODIPINE BESYLATE 10 MG PO TABS: 10.0000 mg | ORAL_TABLET | Freq: Every day | ORAL | 1 refills | Status: AC

## 2023-02-20 MED ORDER — OXYCODONE HCL 5 MG PO TABS: 5.0000 mg | ORAL_TABLET | Freq: Four times a day (QID) | ORAL | 0 refills | Status: DC | PRN

## 2023-02-20 MED ORDER — DEXCOM G7 SENSOR MISC: 2 refills | Status: DC

## 2023-02-20 NOTE — Discharge Instructions (Signed)
Pt advised to hold lantus  and metformin the day before her postpartum visit

## 2023-02-20 NOTE — Inpatient Diabetes Management (Signed)
Inpatient Diabetes Program Recommendations  AACE/ADA: New Consensus Statement on Inpatient Glycemic Control (2015)  Target Ranges:  Prepandial:   less than 140 mg/dL      Peak postprandial:   less than 180 mg/dL (1-2 hours)      Critically ill patients:  140 - 180 mg/dL   Lab Results  Component Value Date   GLUCAP 135 (H) 02/20/2023   HGBA1C 5.8 (H) 09/22/2022    Review of Glycemic Control  Latest Reference Range & Units 02/19/23 20:01 02/19/23 22:19 02/20/23 09:10 02/20/23 11:30  Glucose-Capillary 70 - 99 mg/dL 161 (H) 096 (H) 045 (H) 124 (H)  (H): Data is abnormally high Diabetes history: PreDM, GDM Outpatient Diabetes medications: Metformin 500 mg BID Current orders for Inpatient glycemic control: Novolog 0-15 units TID, Semglee 12 units every day, Metformin 500 mg BID   Inpatient Diabetes Program Recommendations:   Spoke with patient again to discuss plan for insulin at discharge.  Reviewed survival skills, interventions, plan for follow up, when to call MD, setting up appointment with pcp/va, GLPs as an option once lactation is complete, continuing to be mindful of CHO intake, impact of steroids to glucose trends and other commorbidities.  Dexcom G7 placed with Dr Donavan Foil at bedside. Reviewed application, risks/benefits, obtaining additional sensors, costs and when to contact MD. Patient has additional supplies at home. Educated patient and spouse on insulin pen use at home. Reviewed contents of insulin flexpen starter kit. Reviewed all steps if insulin pen including attachment of needle, 2-unit air shot, dialing up dose, giving injection, removing needle, disposal of sharps, storage of unused insulin, disposal of insulin etc. Patient able to provide successful return demonstration. Also reviewed troubleshooting with insulin pen. MD to give patient Rxs for insulin pens and insulin pen needles.  Thanks, Lujean Rave, MSN, RNC-OB Diabetes Coordinator [phone] (8a-5p)

## 2023-02-20 NOTE — Progress Notes (Addendum)
NEUROLOGY CONSULT NOTE   Date of service: February 20, 2023 Patient Name: Kimberly Stark MRN:  [id number] DOB:  Oct 17, 1990 Chief Complaint: "headache and SIPE" Requesting Provider: Otoe Bing, MD  History of Present Illness  Kimberly Stark is a 32 y.o. 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 who presents with  headache and SIPE She postpartum day 2. She states she has a continued headache which had prompted her to the be evaluated on 12/4.  She states she has a history of migraines and takes topamax for this, and has a chiari malformation which causes her to have headaches in the back of her head. She states that this current headache is not like any of her typical migraines or normal headaches she gets. She states that the headache runs across the front of her head from left to right with constant pressure pushing on front of head. She endorses having photophobia. No vomiting or nausea. Currently rates the pain 5-6/10. She states nothing that she has been given has helped with the headache. She is attributing her headaches to hypertension. BP has been SBP 140-150's.   Interval History    No acute overnight events, patient endorses improvement in headache to a 2-3/10 with compazine overnight which she reports is manageable. Additionally, patient reports improved photophobia.   Past History   Past Medical History:  Diagnosis Date   Anemia    Asthma    Bipolar 1 disorder  (HCC)    Blood transfusion without reported diagnosis    Chiari malformation type I (HCC)    Chronic post-traumatic stress disorder 09/19/2021   Glaucoma 09/19/2021   Heart murmur    History of postpartum hemorrhage    History of pulmonary embolism 2021   in setting of covid   Insomnia 09/19/2021   Migraine with typical aura 09/19/2021   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/2021   Pregnancy induced hypertension    Preterm labor    Sickle cell trait (HCC)    UTI (urinary tract infection)    Vaginal Pap smear, abnormal     Past Surgical History:  Procedure Laterality Date   CERVICAL BIOPSY  W/ LOOP ELECTRODE EXCISION     CERVICAL CERCLAGE     CESAREAN SECTION     CESAREAN SECTION N/A 12/30/2021   Procedure: CESAREAN SECTION;  Surgeon: Hermina Staggers, MD;  Location: [facility] LD ORS;  Service: Obstetrics;  Laterality: N/A;   CESAREAN SECTION N/A 02/16/2023   Procedure: CESAREAN SECTION;  Surgeon: Milas Hock, MD;  Location: [facility] LD ORS;  Service: Obstetrics;  Laterality: N/A;   DILATION AND CURETTAGE OF UTERUS     TUBAL LIGATION Bilateral 02/16/2023   Procedure: BILATERAL TUBAL LIGATION;  Surgeon: Milas Hock, MD;  Location: [facility] LD ORS;  Service: Obstetrics;  Laterality: Bilateral;   Family History: Family History  Problem Relation Age of Onset   Stroke Mother    Heart disease Mother        hx open heart issues   Hypertension Mother    Diabetes Mother    Cancer Father  started as colon, w/mets   Kidney disease Father        renal failure   Diabetes Father    Cancer Maternal Grandmother    Diabetes Paternal Grandmother    Social History  reports that she has been smoking cigarettes. She has never used smokeless tobacco. She reports that she does not currently use alcohol. She reports that she does not currently use drugs.  Allergies  Allergen Reactions   Iodinated Contrast Media Shortness Of Breath, Rash and Swelling   Latex Anaphylaxis   Shellfish Allergy  Anaphylaxis   Iodine Hives   Penicillins Hives   Medications   Current Facility-Administered Medications:    acetaminophen (TYLENOL) tablet 1,000 mg, 1,000 mg, Oral, Q6H, Milas Hock, MD, 1,000 mg at 02/20/23 0759   albuterol (PROVENTIL) (2.5 MG/3ML) 0.083% nebulizer solution 3 mL, 3 mL, Inhalation, Q6H PRN, Ozan, Jennifer, DO, 3 mL at 02/17/23 1012   amLODipine (NORVASC) tablet 10 mg, 10 mg, Oral, Daily, Reva Bores, MD, 10 mg at 02/19/23 1027   caffeine tablet 200 mg, 200 mg, Oral, Q6H PRN, Conan Bowens, MD, 200 mg at 02/19/23 0648   coconut oil, 1 Application, Topical, PRN, Milas Hock, MD, 1 Application at 02/17/23 1103   witch hazel-glycerin (TUCKS) pad 1 Application, 1 Application, Topical, PRN **AND** dibucaine (NUPERCAINAL) 1 % rectal ointment 1 Application, 1 Application, Rectal, PRN, Milas Hock, MD   diphenhydrAMINE (BENADRYL) capsule 25 mg, 25 mg, Oral, Q6H PRN, Milas Hock, MD, 25 mg at 02/16/23 2316   enoxaparin (LOVENOX) injection 50 mg, 50 mg, Subcutaneous, Q24H, Milas Hock, MD, 50 mg at 02/19/23 2227   furosemide (LASIX) tablet 20 mg, 20 mg, Oral, Daily, Reva Bores, MD, 20 mg at 02/19/23 1027   gabapentin (NEURONTIN) capsule 300 mg, 300 mg, Oral, BID, Conan Bowens, MD, 300 mg at 02/19/23 2226   HYDROmorphone (DILAUDID) injection 0.2-0.6 mg, 0.2-0.6 mg, Intravenous, Q2H PRN, Milas Hock, MD   [COMPLETED] ketorolac (TORADOL) 30 MG/ML injection 30 mg, 30 mg, Intravenous, Q6H, 30 mg at 02/17/23 0828 **FOLLOWED BY** ibuprofen (ADVIL) tablet 600 mg, 600 mg, Oral, Q6H, Milas Hock, MD, 600 mg at 02/20/23 0209   insulin aspart (novoLOG) injection 0-15 Units, 0-15 Units, Subcutaneous, TID WC, Reva Bores, MD, 2 Units at 02/19/23 2009   insulin glargine-yfgn (SEMGLEE) injection 12 Units, 12 Units, Subcutaneous, Daily, Ozan, Jennifer, DO, 12 Units at 02/19/23 1028   labetalol (NORMODYNE) tablet 300 mg, 300 mg, Oral, BID, Reva Bores, MD, 300 mg at 02/19/23  2226   LORazepam (ATIVAN) tablet 2 mg, 2 mg, Oral, Once PRN, Ramona Bing, MD   lurasidone (LATUDA) tablet 40 mg, 40 mg, Oral, Q breakfast, Federico Flake, MD, 40 mg at 02/19/23 4098   menthol-cetylpyridinium (CEPACOL) lozenge 3 mg, 1 lozenge, Oral, Q2H PRN, Milas Hock, MD   metFORMIN (GLUCOPHAGE) tablet 500 mg, 500 mg, Oral, BID WC, Reva Bores, MD, 500 mg at 02/19/23 1953   metoCLOPramide (REGLAN) tablet 5 mg, 5 mg, Oral, Q8H PRN, Conan Bowens, MD, 5 mg at 02/18/23 2322   oxyCODONE (Oxy IR/ROXICODONE) immediate release tablet 5-10 mg, 5-10 mg, Oral, Q4H PRN, Milas Hock, MD, 10 mg at 02/19/23 1191   prenatal multivitamin tablet 1 tablet, 1 tablet, Oral, Q1200, Milas Hock, MD, 1 tablet at 02/19/23 1302   senna-docusate (Senokot-S) tablet 2 tablet, 2 tablet, Oral, Daily, Milas Hock, MD, 2 tablet at 02/19/23 1027   simethicone (MYLICON) chewable tablet 80 mg, 80 mg,  Oral, TID PC, Milas Hock, MD, 80 mg at 02/19/23 1953   simethicone (MYLICON) chewable tablet 80 mg, 80 mg, Oral, PRN, Milas Hock, MD   sodium chloride flush (NS) 0.9 % injection 3 mL, 3 mL, Intravenous, Q12H, Conan Bowens, MD, 3 mL at 02/19/23 2227   traZODone (DESYREL) tablet 50 mg, 50 mg, Oral, QHS, Ozan, Jennifer, DO, 50 mg at 02/19/23 2226   zolpidem (AMBIEN) tablet 5 mg, 5 mg, Oral, QHS PRN, Milas Hock, MD  Vitals   Vitals:   02/19/23 1923 02/19/23 2351 02/20/23 0411 02/20/23 0806  BP: (!) 142/70 (!) 147/77 135/70 (!) 150/77  Pulse: 86 87 88 86  Resp: 16 18 16 16   Temp: 98.1 F (36.7 C) 98.4 F (36.9 C) 98.5 F (36.9 C) 98.1 F (36.7 C)  TempSrc:  Oral Oral Oral  SpO2: 100% 99% 99% 98%    There is no height or weight on file to calculate BMI.  Physical Exam   Constitutional: Appears well-developed and well-nourished.   Psych: Affect appropriate to situation.  Patient is calm and cooperative with exam.  Eyes: No scleral injection.   HENT: No OP obstruction.   Head:  Normocephalic.   Cardiovascular: Normal rate and regular rhythm.   Respiratory: Effort normal, non-labored breathing.   GI: Soft.  No distension. There is no tenderness.   Skin: WDI.    Neurologic Examination  Mental Status -  Level of arousal and orientation to time, place, and person were intact. Language including expression, naming, repetition, comprehension was assessed and found intact. Attention span and concentration were normal. Recent and remote memory were intact. Fund of Knowledge was assessed and was intact.  Cranial Nerves II - XII - II - Visual field intact OU. III, IV, VI - Extraocular movements intact. V - Facial sensation intact bilaterally. VII - Facial movement intact bilaterally. VIII - Hearing & vestibular intact bilaterally. X - Palate elevates symmetrically. XI - Chin turning & shoulder shrug intact bilaterally. XII - Tongue protrusion intact.  Motor Strength - The patient's strength was normal in all extremities and pronator drift was absent.  Bulk was normal and fasciculations were absent.  Weak grip strength bilaterally, when prompted and coached for improved effort patient states "I am just waking up".  Motor Tone - Muscle tone was assessed at the neck and appendages and was normal. Sensory - Sensation to light touch is intact and symmetric throughout Coordination - The patient had normal movements in the hands and feet with no ataxia or dysmetria.  Tremor was absent. Gait and Station - deferred.  Labs/Imaging/Neurodiagnostic studies   CBC:  Recent Labs  Lab 03-10-2023 1411 02/17/23 0509  WBC 16.5* 17.0*  HGB 9.8* 9.2*  HCT 30.6* 29.8*  MCV 80.5 81.6  PLT 405* 443*   Basic Metabolic Panel:  Lab Results  Component Value Date   NA 133 (L) 02/17/2023   K 4.1 02/17/2023   CO2 17 (L) 02/17/2023   GLUCOSE 350 (H) 02/17/2023   BUN <5 (L) 02/17/2023   CREATININE 0.73 02/17/2023   CALCIUM 8.4 (L) 02/17/2023   GFRNONAA >60 02/17/2023   Lipid  Panel: No results found for: "LDLCALC"  HgbA1c:  Lab Results  Component Value Date   HGBA1C 5.8 (H) 09/22/2022   Imaging: MRV head: Normal intracranial MRV.  No evidence for dural venous thrombosis.   ASSESSMENT   Ciara Soulia is a 32 y.o. female with a history of Chiari malformation as well as chronic headaches who presents  with bifrontal pressure type headache associated with photophobia.  Given the characteristic location and description, my suspicion is that this does represent migraine.   RECOMMENDATIONS  - Okay to repeat compazine with reported improvement in headache following overnight dose - With further headache complaints, consider Depacon 1 g if safe from breastfeeding perspective - Encourage oral hydration and rest - No further inpatient neurologic work up at this time - No neurology objection for discharge with single additional oral compazine dose as needed - Follow up with outpatient neurology for further headache complaints - Plan discussed with attending neurologist who is in agreement and communicated with primary team via secure chat ______________________________________________________________________  Lanae Boast, AGACNP-BC [facility] Pager: [phone]

## 2023-02-20 NOTE — Lactation Note (Signed)
This note was copied from a baby's chart.  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 Mom is awaiting a call back from [facility] for a breast pump. LC provided [facility] loaner form and discussed how to retrieve. Mom reported expressing 3 ounces combined at the previous pump session. Parents were on their way to visit baby to NICU. Discussed how to reach lactation. Praised mom for great progress with pumping.   Mom and dad transported the fresh MBM and four bottles ~ 15ml each to the NICU.  OBJECTIVE Infant data: Mother's Current Feeding Choice: Breast Milk and Donor Milk  O2 Device: Room Air  Infant feeding assessment Scale for Readiness: 1   Maternal data: [id number] C-Section, Low Transverse Pumping frequency: reports pumping every 3 hours Pumped volume: 90 mL Flange Size: 24 Hands-free pumping top sizes: Large Wallace Cullens)  WIC Program: Yes WIC Referral Sent?: Yes What county?: [place] Pump: WIC Loaner (provided [facility] loaner form)  ASSESSMENT Infant:  Feeding Status: Scheduled 8-11-2-5 Feeding method: Tube/Gavage (Bolus)  Maternal: Milk volume: Normal  INTERVENTIONS/PLAN Interventions: Discharge Education: Engorgement and breast care Tools: Pump; Flanges; Hands-free pumping top Pump Education: Milk Storage  Plan: Consult Status: NICU follow-up NICU Follow-up type: Verify onset of copious milk; Verify absence of engorgement  Continue to pump breasts every 3 hours Reach out to lactation for support as needed.  Su Grand 02/20/2023, 11:41 AM

## 2023-02-20 NOTE — Progress Notes (Signed)
Pt discharged home in stable condition after discharge instructions given. Pt verbalized understanding and all questions were answered. Pt was sent home with all belongings.

## 2023-02-26 ENCOUNTER — Ambulatory Visit: Payer: No Typology Code available for payment source

## 2023-02-26 ENCOUNTER — Telehealth (HOSPITAL_COMMUNITY): Payer: Self-pay | Admitting: *Deleted

## 2023-02-26 NOTE — Telephone Encounter (Signed)
02/26/2023  Name: Kimberly Stark MRN: [id number] DOB: 1990-06-18  Reason for Call:  Transition of Care Hospital Discharge Call  Contact Status: Patient Contact Status: Complete  Language assistant needed: Interpreter Mode: Interpreter Not Needed        Follow-Up Questions: Do You Have Any Concerns About Your Health As You Heal From Delivery?: Yes What Concerns Do You Have About Your Health?: Says she would like to see an LC because her breast pump broke and she went 18 hours without pumping.  She will be coming back this evening to spend the night in her baby's room.  NICU nurse has gotten her all the supplies to pump while she is in the room with her baby.  Encouraged patient to let her baby's nurse know that she would like to see the LC this evening. Do You Have Any Concerns About Your Infants Health?: Infant in NICU  Edinburgh Postnatal Depression Scale:  In the Past 7 Days:    PHQ2-9 Depression Scale:     Discharge Follow-up: Edinburgh score requires follow up?:  (Pt declined screening saying she knows it will be high. She has started taking two medications to help with mood, and she just talked to the CSW who is helping her access resources.  She feels like she is coping fine.) Patient was advised of the following resources:: Support Group, Breastfeeding Support Group  Post-discharge interventions: NA  Salena Saner, RN 02/26/2023 14:10

## 2023-02-28 ENCOUNTER — Ambulatory Visit: Payer: No Typology Code available for payment source | Admitting: Obstetrics & Gynecology

## 2023-02-28 ENCOUNTER — Other Ambulatory Visit: Payer: Self-pay

## 2023-02-28 VITALS — BP 148/88 | HR 74 | Wt 201.0 lb

## 2023-02-28 DIAGNOSIS — O119 Pre-existing hypertension with pre-eclampsia, unspecified trimester: Secondary | ICD-10-CM

## 2023-02-28 DIAGNOSIS — I973 Postprocedural hypertension: Secondary | ICD-10-CM

## 2023-02-28 DIAGNOSIS — I10 Essential (primary) hypertension: Secondary | ICD-10-CM

## 2023-02-28 MED ORDER — LABETALOL HCL 200 MG PO TABS: 600.0000 mg | ORAL_TABLET | Freq: Three times a day (TID) | ORAL | 0 refills | Status: AC

## 2023-02-28 NOTE — Progress Notes (Signed)
Incision Check Visit  Kimberly Stark is here for incision check following repeat c-section on 02/16/23.   Assessment: Incision looks well approximated, clean, dry and intact. Small string hanging out of incision. Per physician, string could be trimmed. No s/s symptoms of infection.   Education: Reviewed good wound care and s/s of infection with patient.  Patient will follow up post partum visit . Patient to schedule appointment at front office.  Blood Pressure Check Visit  Kimberly Stark is here for blood pressure check following repeat c-section on 02/16/23. BP today is 140/76. Repeat BP is 148/88 Patient denies any dizzness blurred vision headache shortness of breath peripheral edema. Per provider, adjust BP medication labetalol 600 mg TID and recheck BP in one week. Patient to schedule appointment at front office.   Quintella Reichert, RN 02/28/23

## 2023-03-02 ENCOUNTER — Ambulatory Visit (HOSPITAL_COMMUNITY): Payer: Self-pay

## 2023-03-02 NOTE — Lactation Note (Signed)
This note was copied from a baby's chart. Lactation Consultation Note  Patient Name: Kimberly Stark ZOXWR'U Date: 03/02/2023 Age:34 wk.o.   LC missed seeing Mom on baby's discharge. LC messaged Noralee Stain RN IBCLC to F/U with Mom after baby's discharge.   Judee Clara RN IBCLC 03/02/2023, 4:07 PM

## 2023-03-05 ENCOUNTER — Ambulatory Visit: Payer: No Typology Code available for payment source

## 2023-03-05 ENCOUNTER — Other Ambulatory Visit: Payer: No Typology Code available for payment source

## 2023-03-09 ENCOUNTER — Ambulatory Visit: Payer: No Typology Code available for payment source

## 2023-03-12 ENCOUNTER — Ambulatory Visit: Payer: No Typology Code available for payment source

## 2023-03-15 ENCOUNTER — Inpatient Hospital Stay (HOSPITAL_COMMUNITY): Admit: 2023-03-15 | Payer: No Typology Code available for payment source | Admitting: Family Medicine

## 2023-03-15 DIAGNOSIS — Z3009 Encounter for other general counseling and advice on contraception: Secondary | ICD-10-CM

## 2023-03-15 DIAGNOSIS — Z98891 History of uterine scar from previous surgery: Secondary | ICD-10-CM

## 2023-03-16 ENCOUNTER — Ambulatory Visit: Payer: No Typology Code available for payment source

## 2023-03-24 ENCOUNTER — Encounter: Payer: Self-pay | Admitting: Obstetrics and Gynecology

## 2023-03-27 ENCOUNTER — Other Ambulatory Visit: Payer: Self-pay

## 2023-03-27 DIAGNOSIS — O24429 Gestational diabetes mellitus in childbirth, unspecified control: Secondary | ICD-10-CM

## 2023-03-28 ENCOUNTER — Ambulatory Visit: Payer: No Typology Code available for payment source | Admitting: Obstetrics and Gynecology

## 2023-03-28 ENCOUNTER — Other Ambulatory Visit: Payer: No Typology Code available for payment source

## 2023-03-28 ENCOUNTER — Encounter: Payer: Self-pay | Admitting: Obstetrics and Gynecology

## 2023-03-28 DIAGNOSIS — I10 Essential (primary) hypertension: Secondary | ICD-10-CM

## 2023-03-28 DIAGNOSIS — Z9889 Other specified postprocedural states: Secondary | ICD-10-CM

## 2023-03-28 DIAGNOSIS — Z8632 Personal history of gestational diabetes: Secondary | ICD-10-CM

## 2023-03-28 DIAGNOSIS — N96 Recurrent pregnancy loss: Secondary | ICD-10-CM

## 2023-04-05 ENCOUNTER — Ambulatory Visit: Payer: No Typology Code available for payment source | Attending: Cardiology | Admitting: Cardiology

## 2023-04-09 ENCOUNTER — Other Ambulatory Visit: Payer: Self-pay

## 2023-04-10 ENCOUNTER — Other Ambulatory Visit: Payer: Self-pay

## 2023-04-10 ENCOUNTER — Other Ambulatory Visit (HOSPITAL_COMMUNITY)
Admission: RE | Admit: 2023-04-10 | Discharge: 2023-04-10 | Disposition: A | Discharge status: Home / Self Care | Payer: No Typology Code available for payment source | Source: Ambulatory Visit | Attending: Family Medicine | Admitting: Family Medicine

## 2023-04-10 ENCOUNTER — Ambulatory Visit: Payer: No Typology Code available for payment source | Admitting: Family Medicine

## 2023-04-10 ENCOUNTER — Other Ambulatory Visit: Payer: No Typology Code available for payment source

## 2023-04-10 ENCOUNTER — Encounter: Payer: Self-pay | Admitting: Family Medicine

## 2023-04-10 DIAGNOSIS — Z8632 Personal history of gestational diabetes: Secondary | ICD-10-CM | POA: Diagnosis not present

## 2023-04-10 DIAGNOSIS — N898 Other specified noninflammatory disorders of vagina: Secondary | ICD-10-CM

## 2023-04-10 DIAGNOSIS — I10 Essential (primary) hypertension: Secondary | ICD-10-CM | POA: Diagnosis not present

## 2023-04-10 DIAGNOSIS — Z86711 Personal history of pulmonary embolism: Secondary | ICD-10-CM

## 2023-04-10 DIAGNOSIS — Z9851 Tubal ligation status: Secondary | ICD-10-CM

## 2023-04-10 NOTE — Progress Notes (Signed)
Post Partum Visit Note  Kimberly Stark is a 33 y.o. [id number] female who presents for a postpartum visit. She is  7.6  weeks postpartum following a primary cesarean section.  I have fully reviewed the prenatal and intrapartum course. The delivery was at 33.2 gestational weeks.  Anesthesia: spinal. Postpartum course has been well. Baby is doing well. Baby is feeding by both breast and bottle - Similac Neosure. Bleeding no bleeding. Bowel function is normal. Bladder function is normal. Patient is not sexually active. Contraception method is tubal ligation. Postpartum depression screening: negative.   The pregnancy intention screening data noted above was reviewed. Potential methods of contraception were discussed. The patient elected to proceed with No data recorded.   Edinburgh Postnatal Depression Scale - 04/10/23 0823       Edinburgh Postnatal Depression Scale:  In the Past 7 Days   I have been able to laugh and see the funny side of things. 0    I have looked forward with enjoyment to things. 0    I have blamed myself unnecessarily when things went wrong. 0    I have been anxious or worried for no good reason. 0    I have felt scared or panicky for no good reason. 0    Things have been getting on top of me. 2    I have been so unhappy that I have had difficulty sleeping. 0    I have felt sad or miserable. 1    I have been so unhappy that I have been crying. 0    The thought of harming myself has occurred to me. 0    Edinburgh Postnatal Depression Scale Total 3             Health Maintenance Due  Topic Date Due   COVID-19 Vaccine (3 - 2024-25 season) 11/12/2022    The following portions of the patient's history were reviewed and updated as appropriate: allergies, current medications, past family history, past medical history, past social history, past surgical history, and problem list.  Review of Systems Pertinent positives and negative per HPI, all others reviewed and  negative  Objective:  BP 137/86   Pulse 64   Wt 200 lb (90.7 kg)   Breastfeeding Yes   BMI 37.79 kg/m    General:  alert, cooperative, and appears stated age   Breasts:  not indicated  Lungs: Comfortalbe on room air  Wound well approximated incision  GU exam:  not indicated        Assessment:   Postpartum exam  History of pulmonary embolus (PE)  History of gestational diabetes  Chronic hypertension  History of bilateral tubal ligation  Vaginal discharge - Plan: Cervicovaginal ancillary only   Normal postpartum exam.   Plan:   Essential components of care per ACOG recommendations:  1.  Mood and well being: Patient with negative depression screening today. Reviewed local resources for support.  - Patient tobacco use? Yes. Patient desires to quit? No.   - hx of drug use? No.    2. Infant care and feeding:  -Patient currently breastmilk feeding? Yes. Reviewed importance of draining breast regularly to support lactation.  -Social determinants of health (SDOH) reviewed in EPIC. No concerns  3. Sexuality, contraception and birth spacing - Patient does not want a pregnancy in the next year.  Desired family size is 4 children.  - Reviewed reproductive life planning. Reviewed contraceptive methods based on pt preferences and effectiveness.  Patient had bilateral  tubal ligation at time of cesarean.   - Discussed birth spacing of 18 months  4. Sleep and fatigue -Encouraged family/partner/community support of 4 hrs of uninterrupted sleep to help with mood and fatigue  5. Physical Recovery  - Discussed patients delivery and complications. She describes her labor as n/a - Patient had a C-section due to progression to chronic hypertension with superimposed severe pre-eclampsia. - Patient has urinary incontinence? No. - Patient is safe to resume physical and sexual activity  6.  Health Maintenance - HM due items addressed No - up to date - Last pap smear  Diagnosis   Date Value Ref Range Status  09/22/2022   Final   - Negative for intraepithelial lesion or malignancy (NILM)   Pap smear not done at today's visit.  -Breast Cancer screening indicated? No.   7. Chronic Disease/Pregnancy Condition follow up: Hypertension and Gestational Diabetes and hx of PE  #Essential hypertension BP at goal today, currently taking amlodipine 10 and labetalol 600 BID. Discussed she may benefit from switching to ACE/ARB, recommend she discuss this with PCP and continue meds for now.   #Hx of GDM Was previously on insulin and metformin in pregnancy, and actually continued on this. Reports she has been getting hypoglycemia overnight and her CGM has been alarming. 2hr GTT today cancelled, instructed to stop meds and return in 2-4 weeks for GTT.  #Hx of PE Provoked during COVID infection, has completed 6 wks of PP anticoagulation, instructed to stop.   - PCP follow up   Venora Maples, MD, MPH, FAAFP Attending Family Medicine Physician, [facility] for [facility], [facility]

## 2023-04-11 ENCOUNTER — Encounter: Payer: Self-pay | Admitting: Family Medicine

## 2023-04-11 LAB — CERVICOVAGINAL ANCILLARY ONLY
Bacterial Vaginitis (gardnerella): NEGATIVE
Candida Glabrata: NEGATIVE
Candida Vaginitis: POSITIVE — AB
Comment: NEGATIVE
Comment: NEGATIVE
Comment: NEGATIVE

## 2023-04-11 MED ORDER — FLUCONAZOLE 150 MG PO TABS: 150.0000 mg | ORAL_TABLET | Freq: Once | ORAL | 0 refills | Status: AC

## 2023-04-11 NOTE — Addendum Note (Signed)
Addended by: Merian Capron on: 04/11/2023 11:14 AM   Modules accepted: Orders

## 2023-04-30 ENCOUNTER — Other Ambulatory Visit: Payer: No Typology Code available for payment source

## 2023-05-17 ENCOUNTER — Ambulatory Visit (HOSPITAL_COMMUNITY)

## 2023-06-03 IMAGING — US US OB COMP LESS 14 WK
1 series · 14 of 26 positions shown · non-contrast
Comparison: None.
COMPARISON: None.

Addendum:
CLINICAL DATA: History of cervical cerclage. Irregular cycle.
Uncertain dating.

EXAM:
OBSTETRIC <14 WK US AND TRANSVAGINAL OB US
TECHNIQUE: Both transabdominal and transvaginal ultrasound examinations were
performed for complete evaluation of the gestation as well as the
maternal uterus, adnexal regions, and pelvic cul-de-sac.
Transvaginal technique was performed to assess early pregnancy.
TECHNIQUE: Transabdominal ultrasound was performed for evaluation of the
gestation as well as the maternal uterus and adnexal regions.
*** End of Addendum ***

[Series 1: us ob comp less 14 wk · 26 acquisitions, 14 frames shown]
[im 1/26]
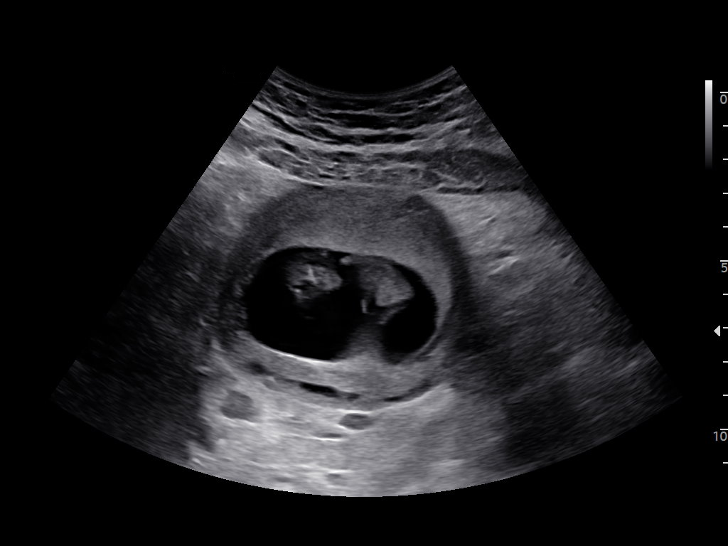
[im 3/26]
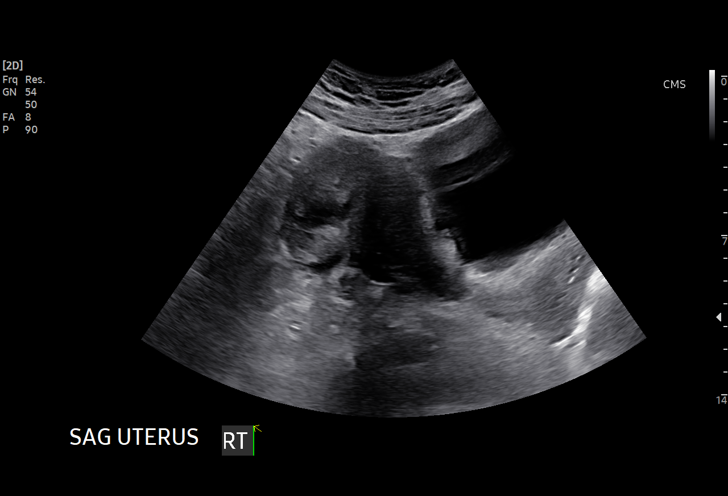
[im 5/26]
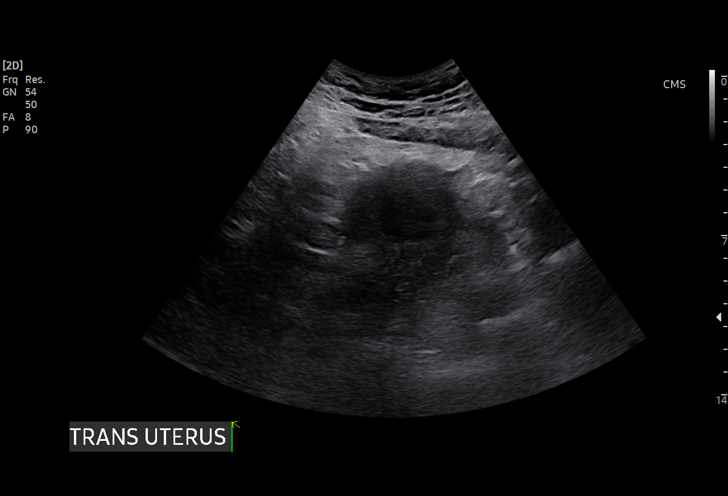
[im 7/26]
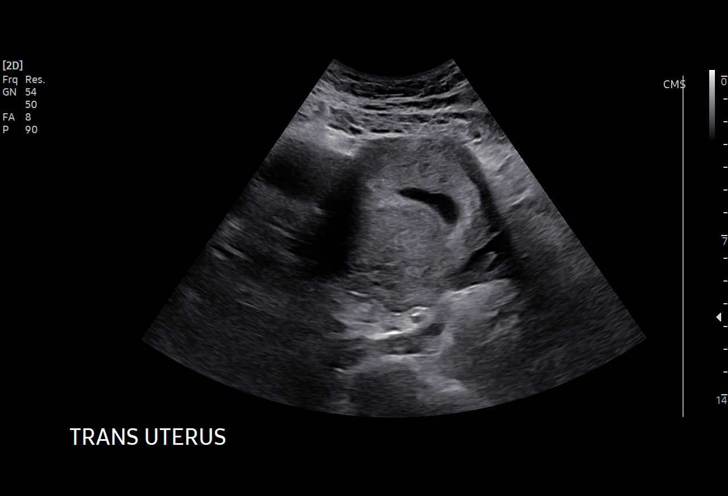
[im 9/26]
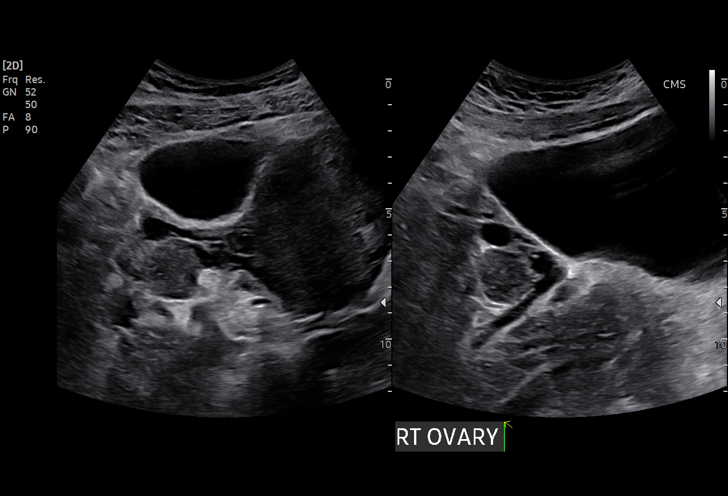
[im 11/26]
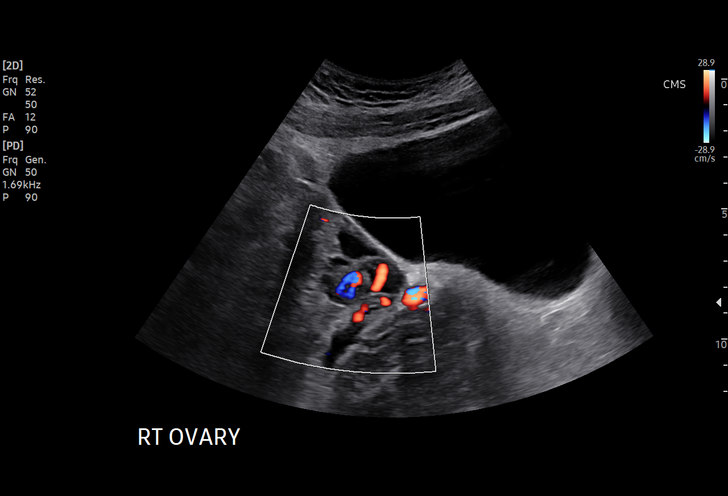
[im 13/26]
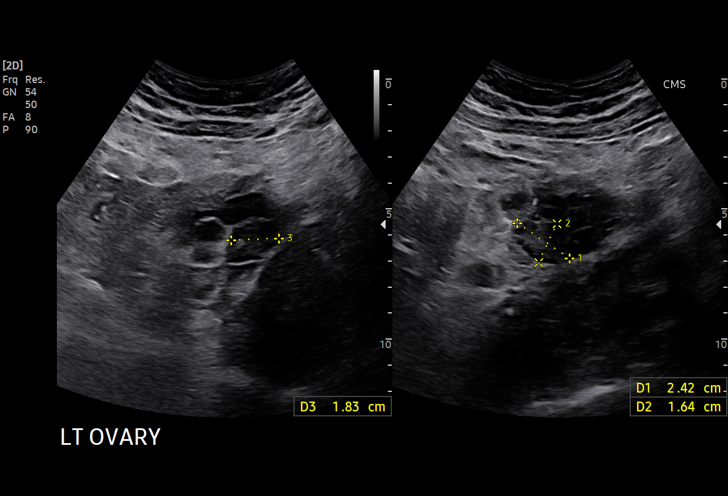
[im 14/26]
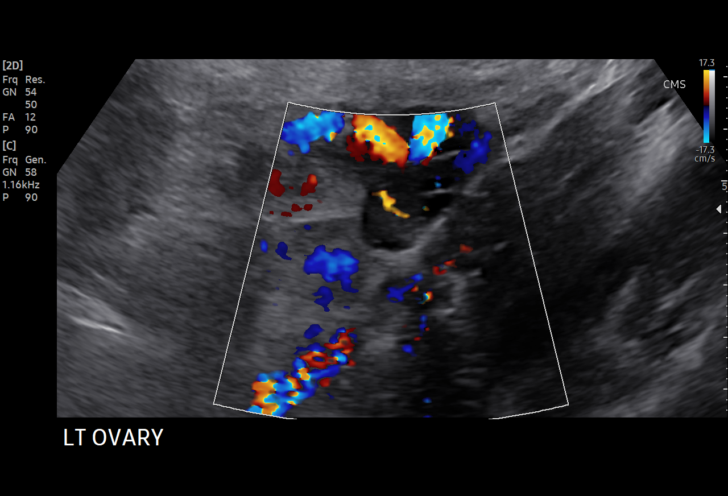
[im 16/26]
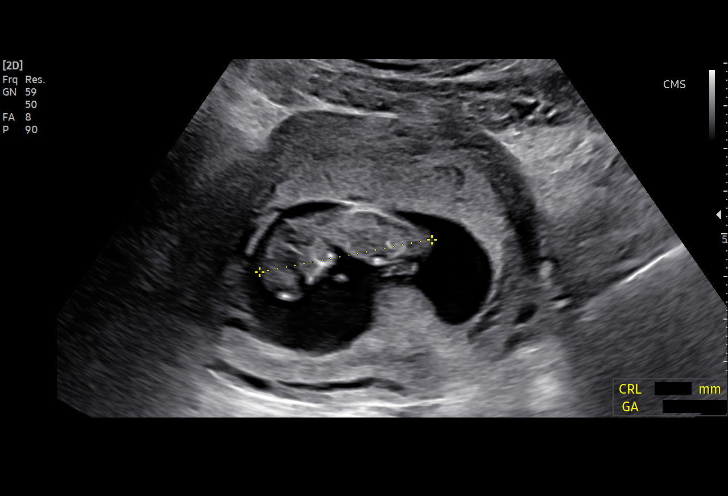
[im 18/26]
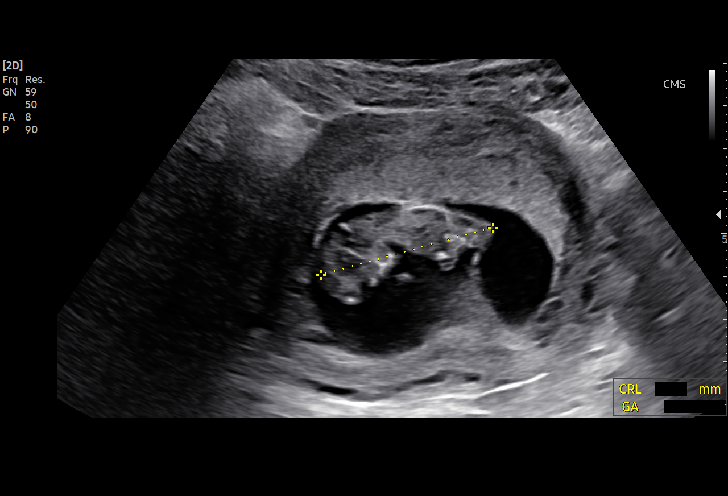
[im 20/26]
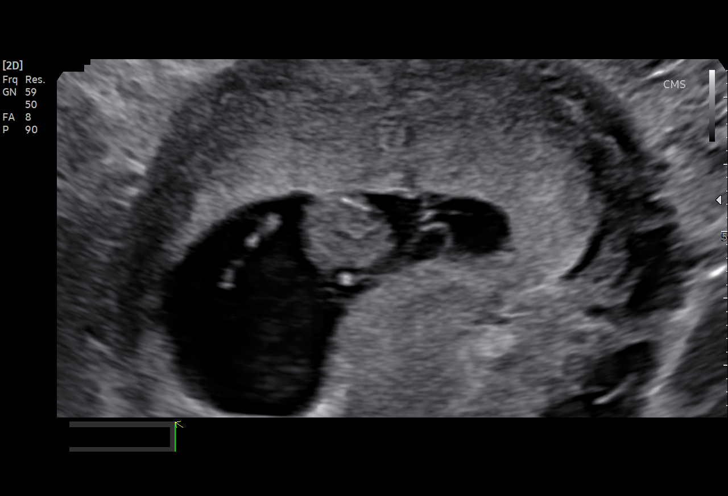
[im 22/26]
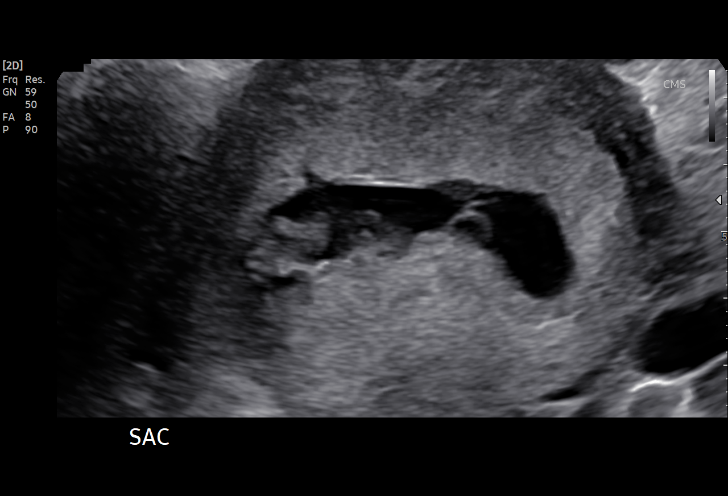
[im 24/26]
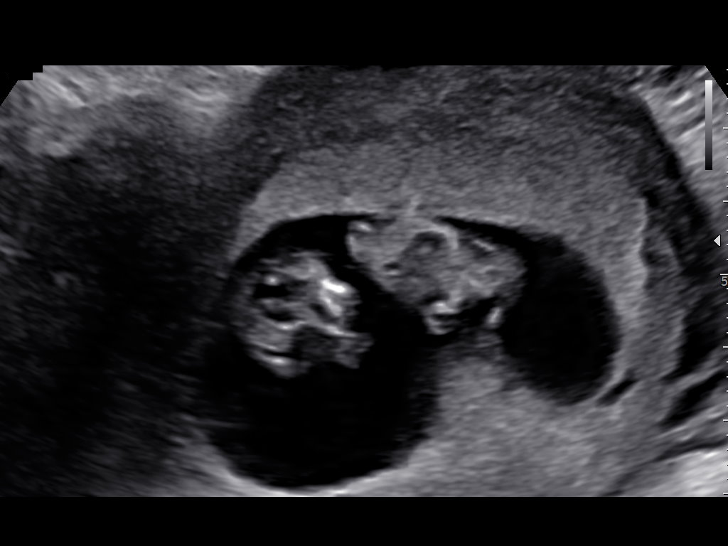
[im 26/26]
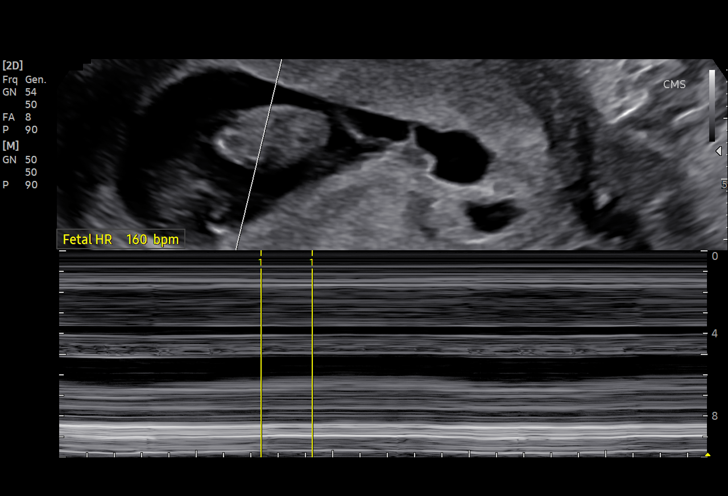

[14 of 26 positions shown; findings below may reference images not displayed]

FINDINGS: Intrauterine gestational sac: Single

Yolk sac:  Visualized.

Embryo:  Visualized.

Cardiac Activity: Visualized.

Heart Rate: 160 bpm

CRL:  41.5 mm   11 w   1 d                  US EDC: 01/18/2022

Subchorionic hemorrhage:  None visualized.

Maternal uterus/adnexae: Unremarkable appearance of the ovaries. No
pelvic free fluid.
IMPRESSION: Single viable IUP as above.

ADDENDUM:
Correction to the original report. Only a transabdominal examination
was performed. The corrected exam and technique sections are as
follows:

EXAM:
OBSTETRIC <14 WK ULTRASOUND
FINDINGS: Intrauterine gestational sac: Single

Yolk sac:  Visualized.

Embryo:  Visualized.

Cardiac Activity: Visualized.

Heart Rate: 160 bpm

CRL:  41.5 mm   11 w   1 d                  US EDC: 01/18/2022

Subchorionic hemorrhage:  None visualized.

Maternal uterus/adnexae: Unremarkable appearance of the ovaries. No
pelvic free fluid.
IMPRESSION: Single viable IUP as above.

## 2023-07-08 IMAGING — US US MFM OB TRANSVAGINAL
1 series · 12 of 28 positions shown · non-contrast
Comparison: none

[Series 1: us mfm ob transvaginal · 12 of 52 slices shown]
[im 2/52]
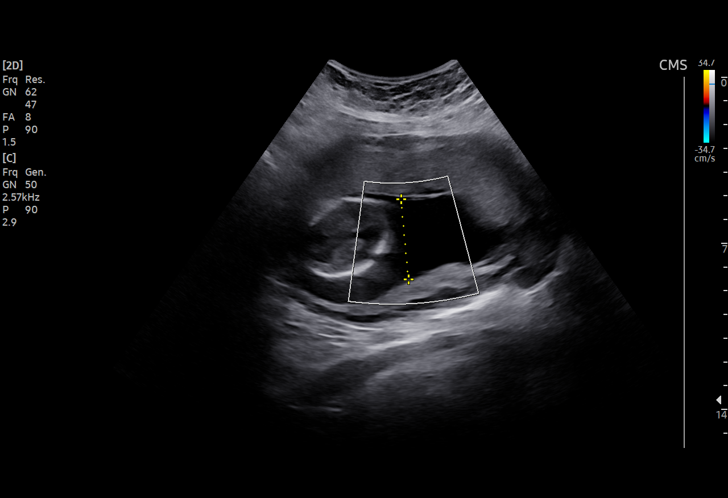
[im 6/52]
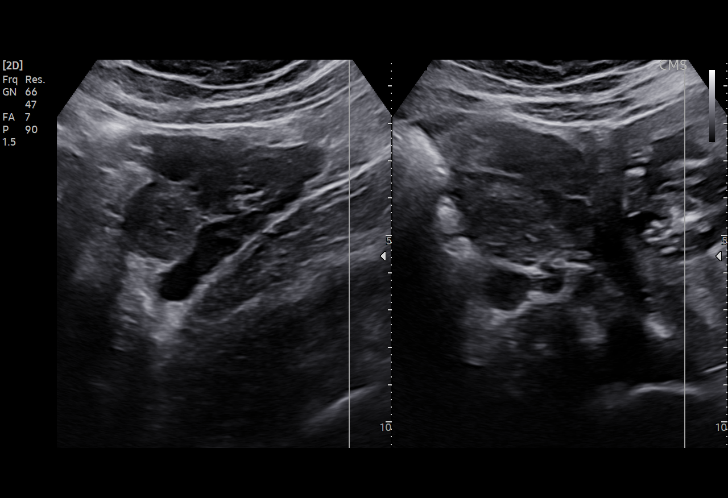
[im 10/52]
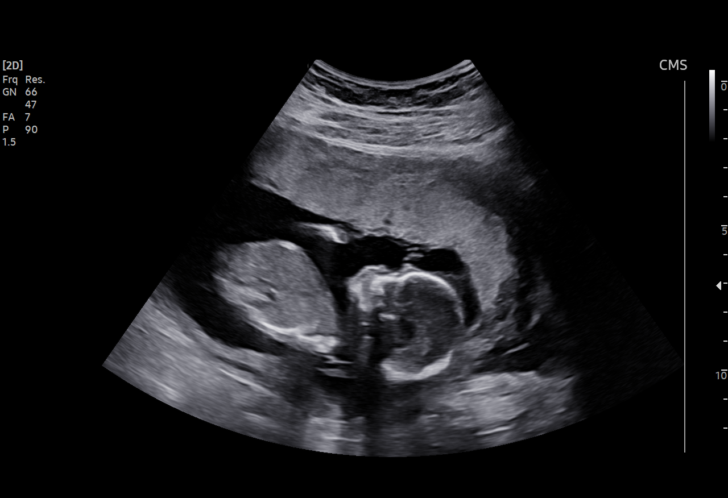
[im 16/52]
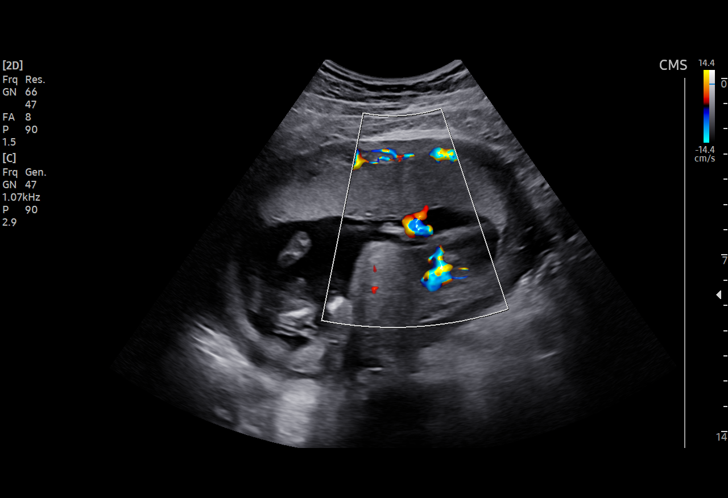
[im 19/52]
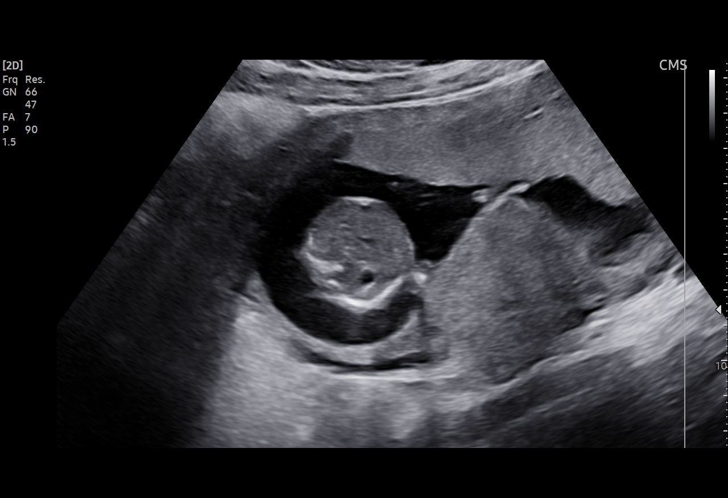
[im 23/52]
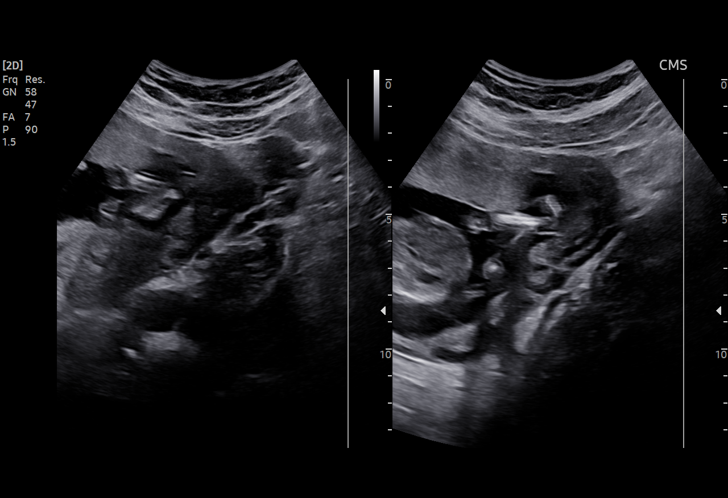
[im 29/52]
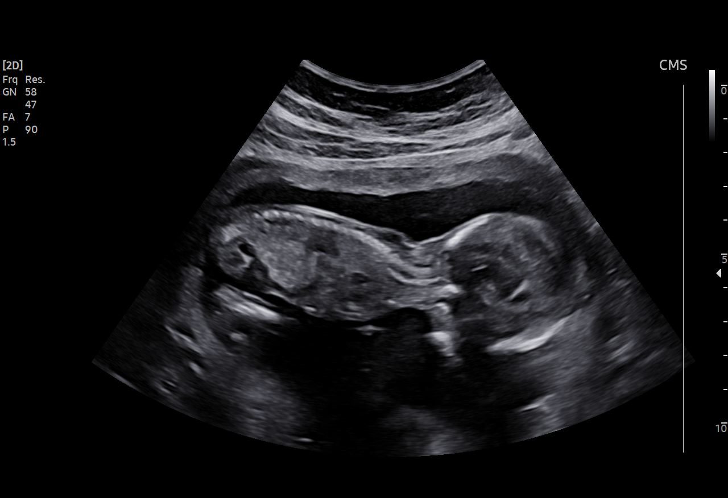
[im 33/52]
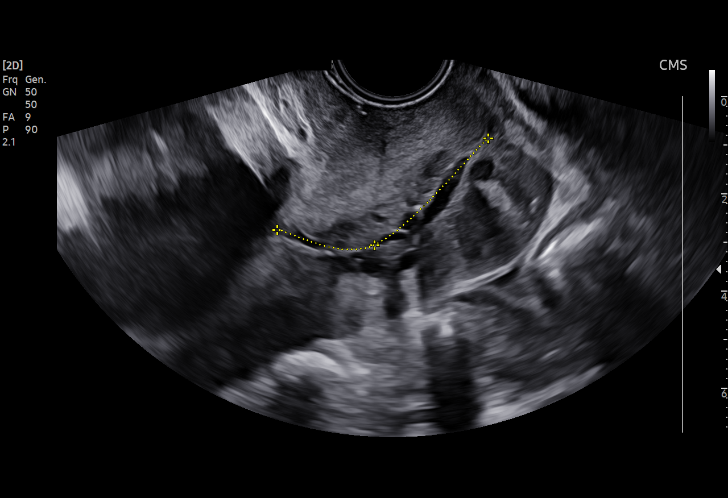
[im 36/52]
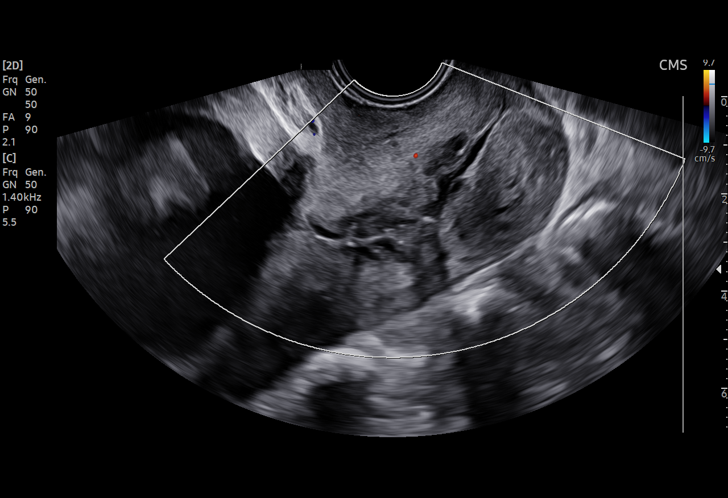
[im 42/52]
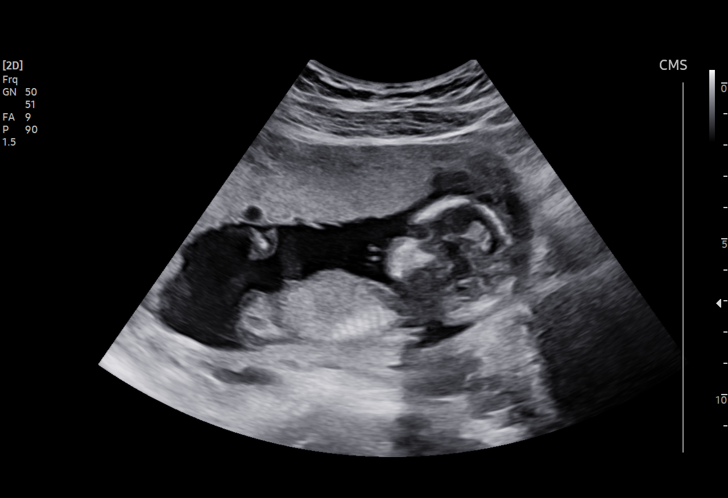
[im 46/52]
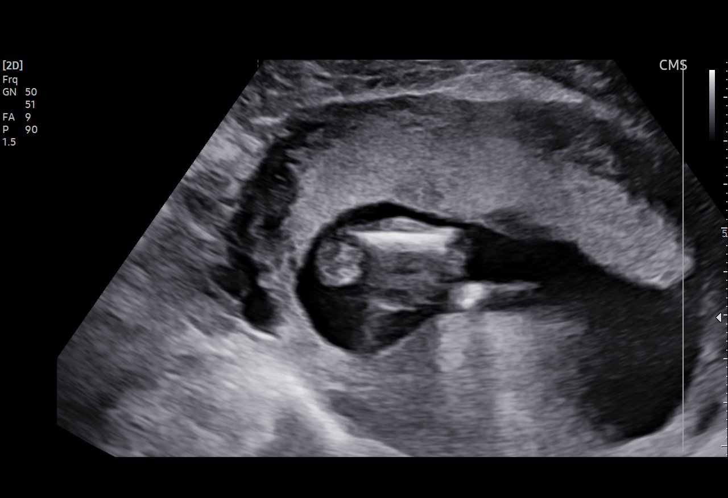
[im 50/52]
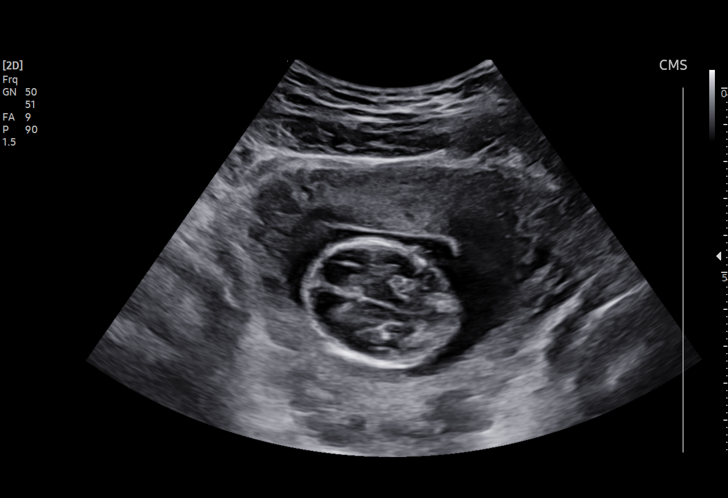

[12 of 28 positions shown; findings below may reference images not displayed]

1  US MFM OB TRANSVAGINAL                76817.2     RIGO EXANTUS
 2  US MFM OB LIMITED                     76815.01    RIGO EXANTUS

Indications

 Cervical incompetence, second trimester
 (cerclage x 2)
 Hypertension - Chronic/Pre-existing
 (nifedipine)
 16 weeks gestation of pregnancy
 Encounter for cervical length
 Poor obstetric history: Previous preeclampsia
 Previous cesarean delivery, antepartum
 (VBAC since)
 Poor obstetric history: Previous preterm
 delivery, antepartum (30 and 34 weeks)
 Medical complication of pregnancy (hx PE -
 on Lovenox)
Fetal Evaluation

 Num Of Fetuses:         1
 Fetal Heart Rate(bpm):  150
 Cardiac Activity:       Observed
 Presentation:           Variable
 Placenta:               Anterior
 P. Cord Insertion:      Not well visualized

 Amniotic Fluid
 AFI FV:      Within normal limits

                             Largest Pocket(cm)

OB History

 Gravidity:    11        Term:   0        Prem:   2        SAB:   7
 TOP:          1       Ectopic:  0        Living: 2
Gestational Age

 LMP:           19w 2d        Date:  03/22/21                 EDD:   12/27/21
 Best:          16w 1d     Det. By:  Early Ultrasound         EDD:   01/18/22
                                     (06/30/21)
Anatomy

 Diaphragm:             Visualized             Bladder:                Visualized
 Stomach:               Appears normal, left   Upper Extremities:      Visualized
                        sided
 Kidneys:               Visualized             Lower Extremities:      Visualized
Cervix Uterus Adnexa

 Cervix
 Length:            5.4  cm.
 Normal appearance by transvaginal scan

 Uterus
 No abnormality visualized.

 Right Ovary
 Within normal limits.

 Cul De Sac
 No free fluid seen.

 Adnexa
 No abnormality visualized.
Comments

 Maria Antonia Huda was seen due to a history of two prior
 preterm births at 30 weeks and 34 weeks.  She had cerclage
 is placed during her 2 prior pregnancies.  She would prefer
 not to have a cerclage placed in her current pregnancy.

 Her pregnancy has also been complicated by chronic
 hypertension treated with nifedipine.  She reports a history of
 a pulmonary embolus in her prior pregnancy.  She is currently
 treated with prophylactic Lovenox 40 mg daily.

 She had a cell free DNA test indicating a low risk for trisomy
 21, 18, and 13.  A female fetus is predicted.

 She has also screened positive as a carrier for cystic fibrosis.
 The FOB is being screened today to determine if he is also a
 carrier for the cystic fibrosis gene.

 A limited ultrasound performed today shows a viable
 singleton intrauterine gestation with normal amniotic fluid.

 A transvaginal ultrasound performed today shows a cervical
 length of 5.4 cm long without any signs of funneling.

 The following were discussed during today's consultation:

 Chronic hypertension in pregnancy

 The implications and management of chronic hypertension in
 pregnancy was discussed.
 The patient was advised that should her blood pressures
 continue to be elevated, the dosage of her antihypertensive
 medications may need to be increased.
 The increased risk of superimposed preeclampsia, an
 indicated preterm delivery, and possible fetal growth
 restriction due to chronic hypertension in pregnancy was
 discussed.
 We will continue to follow her with monthly growth scans.
 Weekly fetal testing should be started at around 32 weeks.
 To decrease her risk of superimposed preeclampsia, she
 should continue taking a daily baby aspirin (81 mg daily) for
 preeclampsia prophylaxis.
 History of a pulmonary embolus in a prior pregnancy
 Due to her history of a prior thromboembolic event, the
 patient should remain on prophylactic anticoagulation for the
 duration of her pregnancy and for 6 weeks postpartum.
 In regards to the management of her anticoagulation and
 delivery, she may either continue the prophylactic Lovenox up
 until the day before her scheduled delivery date.
 Alternatively, she may be switched to twice daily subcu
 As she is only treated with a prophylactic dose of
 anticoagulation, she may be eligible to receive regional
 anesthesia if it has been 12 hours or more since she received
 the anticoagulation medication.
 History of two prior preterm births
 Based on the normal cervical length noted today, she was
 reassured that her risk of a preterm birth is low.  Therefore,
 she does not need a cerclage at this time.
 We will perform another transvaginal ultrasound to assess
 the cervical length when she returns for her detailed fetal
 anatomy scan in 3 weeks.
 Carrier for the cystic fibrosis gene
 We will await the results of the FOB's test.
 Should he also be a carrier for the cystic fibrosis gene, they
 understand that there is a 25% chance that their child will
 have cystic fibrosis.  They understand that an amniocentesis
 is available for prenatal diagnosis of cystic fibrosis.
 Should he screen negative as a carrier for cystic fibrosis, the
 chances that their child will have cystic fibrosis is low.
 A detailed fetal anatomy scan has been scheduled in 3
 weeks.
 The patient and her partner stated that all of their questions
 were answered today.
 A total of 45 minutes was spent counseling and coordinating
 the care for this patient.  Greater than 50% of the time was
 spent in direct face-to-face contact.

## 2023-08-02 ENCOUNTER — Emergency Department (HOSPITAL_COMMUNITY)
Admission: EM | Admit: 2023-08-02 | Discharge: 2023-08-03 | Disposition: A | Discharge status: Home / Self Care | Attending: Emergency Medicine | Admitting: Emergency Medicine

## 2023-08-02 ENCOUNTER — Other Ambulatory Visit: Payer: Self-pay

## 2023-08-02 ENCOUNTER — Emergency Department (HOSPITAL_COMMUNITY)

## 2023-08-02 DIAGNOSIS — J45909 Unspecified asthma, uncomplicated: Secondary | ICD-10-CM | POA: Insufficient documentation

## 2023-08-02 DIAGNOSIS — D75839 Thrombocytosis, unspecified: Secondary | ICD-10-CM | POA: Insufficient documentation

## 2023-08-02 DIAGNOSIS — Z91040 Latex allergy status: Secondary | ICD-10-CM | POA: Insufficient documentation

## 2023-08-02 DIAGNOSIS — N83202 Unspecified ovarian cyst, left side: Secondary | ICD-10-CM | POA: Diagnosis not present

## 2023-08-02 DIAGNOSIS — R1031 Right lower quadrant pain: Secondary | ICD-10-CM | POA: Diagnosis present

## 2023-08-02 DIAGNOSIS — D72829 Elevated white blood cell count, unspecified: Secondary | ICD-10-CM | POA: Diagnosis not present

## 2023-08-02 LAB — CBC WITH DIFFERENTIAL/PLATELET
Abs Immature Granulocytes: 0.05 10*3/uL (ref 0.00–0.07)
Basophils Absolute: 0.1 10*3/uL (ref 0.0–0.1)
Basophils Relative: 0 %
Eosinophils Absolute: 0.1 10*3/uL (ref 0.0–0.5)
Eosinophils Relative: 1 %
HCT: 40.4 % (ref 36.0–46.0)
Hemoglobin: 13.3 g/dL (ref 12.0–15.0)
Immature Granulocytes: 0 %
Lymphocytes Relative: 22 %
Lymphs Abs: 2.9 10*3/uL (ref 0.7–4.0)
MCH: 28.2 pg (ref 26.0–34.0)
MCHC: 32.9 g/dL (ref 30.0–36.0)
MCV: 85.6 fL (ref 80.0–100.0)
Monocytes Absolute: 0.7 10*3/uL (ref 0.1–1.0)
Monocytes Relative: 5 %
Neutro Abs: 9.7 10*3/uL — ABNORMAL HIGH (ref 1.7–7.7)
Neutrophils Relative %: 72 %
Platelets: 402 10*3/uL — ABNORMAL HIGH (ref 150–400)
RBC: 4.72 MIL/uL (ref 3.87–5.11)
RDW: 13.4 % (ref 11.5–15.5)
WBC: 13.4 10*3/uL — ABNORMAL HIGH (ref 4.0–10.5)
nRBC: 0 % (ref 0.0–0.2)

## 2023-08-02 LAB — COMPREHENSIVE METABOLIC PANEL WITH GFR
ALT: 21 U/L (ref 0–44)
AST: 14 U/L — ABNORMAL LOW (ref 15–41)
Albumin: 4.1 g/dL (ref 3.5–5.0)
Alkaline Phosphatase: 98 U/L (ref 38–126)
Anion gap: 8 (ref 5–15)
BUN: 7 mg/dL (ref 6–20)
CO2: 23 mmol/L (ref 22–32)
Calcium: 9.4 mg/dL (ref 8.9–10.3)
Chloride: 106 mmol/L (ref 98–111)
Creatinine, Ser: 0.68 mg/dL (ref 0.44–1.00)
GFR, Estimated: >60 mL/min (ref >60)
Glucose, Bld: 88 mg/dL (ref 70–99)
Potassium: 3.5 mmol/L (ref 3.5–5.1)
Sodium: 137 mmol/L (ref 135–145)
Total Bilirubin: 0.6 mg/dL (ref 0.0–1.2)
Total Protein: 6.8 g/dL (ref 6.5–8.1)

## 2023-08-02 LAB — URINALYSIS, W/ REFLEX TO CULTURE (INFECTION SUSPECTED)
Bilirubin Urine: NEGATIVE
Glucose, UA: NEGATIVE mg/dL
Hgb urine dipstick: NEGATIVE
Ketones, ur: NEGATIVE mg/dL
Leukocytes,Ua: NEGATIVE
Nitrite: NEGATIVE
Protein, ur: NEGATIVE mg/dL
Specific Gravity, Urine: 1.016 (ref 1.005–1.030)
pH: 6 (ref 5.0–8.0)

## 2023-08-02 LAB — CBG MONITORING, ED: Glucose-Capillary: 110 mg/dL — ABNORMAL HIGH (ref 70–99)

## 2023-08-02 LAB — HCG, SERUM, QUALITATIVE: Preg, Serum: NEGATIVE

## 2023-08-02 LAB — LIPASE, BLOOD: Lipase: 30 U/L (ref 11–51)

## 2023-08-02 MED ORDER — OXYCODONE-ACETAMINOPHEN 5-325 MG PO TABS
1.0000 | ORAL_TABLET | Freq: Once | ORAL | Status: AC
  Administered 2023-08-02: 1 via ORAL
  Filled 2023-08-02: qty 1

## 2023-08-02 MED ORDER — ONDANSETRON 4 MG PO TBDP
4.0000 mg | ORAL_TABLET | Freq: Once | ORAL | Status: AC
  Administered 2023-08-02: 4 mg via ORAL
  Filled 2023-08-02: qty 1

## 2023-08-02 NOTE — ED Notes (Signed)
 Wasn't able to obtain labs.

## 2023-08-02 NOTE — ED Provider Triage Note (Signed)
 Emergency Medicine Provider Triage Evaluation Note  Kimberly Stark , a 33 y.o. female  was evaluated in triage.  Pt complains of ab pain. Patient had some cramps earlier today and this afternoon, had sudden onset RLQ pain that is sharp. Pain worse with movement, no hx of kidney stones  Review of Systems  Positive: RLQ pain  Negative:   Physical Exam  BP (!) 141/86 (BP Location: Right Arm)   Pulse 65   Temp 98.3 F (36.8 C)   Resp 20   Ht 5\' 4"  (1.626 m)   Wt 86.2 kg   SpO2 99%   BMI 32.61 kg/m  Gen:   Awake, no distress   Resp:  Normal effort  MSK:   Moves extremities without difficulty  Other:  RLQ tenderness  Medical Decision Making  Medically screening exam initiated at 5:02 PM.  Appropriate orders placed.  Kimberly Stark was informed that the remainder of the evaluation will be completed by another provider, this initial triage assessment does not replace that evaluation, and the importance of remaining in the ED until their evaluation is complete.  Kimberly Stark is a 33 y.o. female here with RLQ pain. Likely renal colic. Will get cbc, cmp, ua, CT renal stone     Dalene Duck, MD 08/02/23 [phone]

## 2023-08-02 NOTE — ED Triage Notes (Signed)
 PT bib ems c.o lower mid abd pain/cramping. Pt had a BM but now pain is sharp all over. Pt alos c.o light headed with multiple near syncopal episodes today. Pt also c.o SOB due to the pain.  Pt is 5 months postpartum with hx of preeclampsia.

## 2023-08-03 ENCOUNTER — Emergency Department (HOSPITAL_COMMUNITY)

## 2023-08-03 MED ORDER — NAPROXEN 500 MG PO TABS: 500.0000 mg | ORAL_TABLET | Freq: Two times a day (BID) | ORAL | 0 refills | Status: AC

## 2023-08-03 MED ORDER — KETOROLAC TROMETHAMINE 60 MG/2ML IM SOLN
30.0000 mg | Freq: Once | INTRAMUSCULAR | Status: AC
  Administered 2023-08-03: 30 mg via INTRAMUSCULAR
  Filled 2023-08-03: qty 2

## 2023-08-03 NOTE — ED Notes (Signed)
 Pt back from ultrasound scan

## 2023-08-03 NOTE — ED Provider Notes (Signed)
 [place] EMERGENCY DEPARTMENT AT [facility] Provider Note   CSN: [id number] Arrival date & time: 08/02/23  1630     History  Chief Complaint  Patient presents with   Abdominal Pain   Near Syncope    Kimberly Stark is a 33 y.o. female.  The history is provided by the patient.  Abdominal Pain Pain location:  RLQ Pain quality: sharp   Pain radiates to:  Does not radiate Pain severity:  Severe Onset quality:  Sudden Timing:  Constant Progression:  Improving Chronicity:  New Relieved by:  Nothing Worsened by:  Nothing Ineffective treatments:  None tried Associated symptoms: no chest pain, no dysuria, no fever and no shortness of breath   Risk factors: not pregnant   Sudden onset RLQ no associated symptoms.  No urinary symptoms.      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 Authorizing Provider  naproxen (NAPROSYN) 500 MG tablet Take 1 tablet (500 mg total) by mouth 2 (two) times daily. 08/03/23  Yes Ayoub Arey, MD  albuterol  (VENTOLIN  HFA) 108 (90 Base) MCG/ACT inhaler Inhale 1-2 puffs into the lungs every 6 (six) hours as needed for wheezing or shortness of breath.    [provider]  amLODipine  (NORVASC ) 10 MG tablet Take 1 tablet (10 mg total) by mouth daily. 02/21/23   Abigail Abler, MD  labetalol  (NORMODYNE ) 200 MG tablet Take 3 tablets (600 mg total) by mouth in the morning, at noon, and at bedtime. 02/28/23   Anyanwu, Ugonna A, MD  pantoprazole  (PROTONIX ) 40 MG tablet Take 1 tablet (40 mg total) by mouth daily. 01/02/23 02/13/23  Teena Feast, MD      Allergies    Iodinated contrast media, Latex, Shellfish allergy, Iodine , and Penicillins    Review of Systems   Review of Systems  Constitutional:  Negative for fever.  Respiratory:  Negative for shortness of breath, wheezing and stridor.   Cardiovascular:  Negative for chest pain.  Gastrointestinal:  Positive for abdominal pain.  Genitourinary:  Negative for dysuria.  All other systems reviewed and are negative.   Physical Exam Updated Vital Signs BP 133/71 (BP Location: Right Arm)   Pulse (!) 58   Temp 98.2 F (36.8 C) (Oral)   Resp 18   Ht 5\' 4"  (1.626 m)   Wt 86.2 kg   SpO2 100%   BMI 32.61 kg/m  Physical Exam Vitals and nursing note reviewed.  Constitutional:      General: She is not in acute distress.    Appearance: Normal appearance. She is well-developed.  HENT:     Head: Normocephalic and atraumatic.     Nose: Nose normal.  Eyes:  Pupils: Pupils are equal, round, and reactive to light.  Cardiovascular:     Rate and Rhythm: Normal rate and regular rhythm.     Pulses: Normal pulses.     Heart sounds: Normal heart sounds.  Pulmonary:     Effort: Pulmonary effort is normal. No respiratory distress.     Breath sounds: Normal breath sounds.  Abdominal:     General: Bowel sounds are normal. There is no distension.     Palpations: Abdomen is soft.     Tenderness: There is no abdominal tenderness. There is no guarding or rebound.  Musculoskeletal:        General: Normal range of motion.     Cervical back: Neck supple.  Skin:    General: Skin is warm and dry.     Capillary Refill: Capillary refill takes less than 2 seconds.     Findings: No erythema or rash.   Neurological:     General: No focal deficit present.     Mental Status: She is alert and oriented to person, place, and time.     Deep Tendon Reflexes: Reflexes normal.  Psychiatric:        Mood and Affect: Mood normal.     ED Results / Procedures / Treatments   Labs (all labs ordered are listed, but only abnormal results are displayed) Results for orders placed or performed during the hospital encounter of 08/02/23  CBG monitoring, ED   Collection Time: 08/02/23  4:55 PM  Result Value Ref Range   Glucose-Capillary 110 (H) 70 - 99 mg/dL  CBC with Differential   Collection Time: 08/02/23  8:57 PM  Result Value Ref Range   WBC 13.4 (H) 4.0 - 10.5 K/uL   RBC 4.72 3.87 - 5.11 MIL/uL   Hemoglobin 13.3 12.0 - 15.0 g/dL   HCT 16.1 09.6 - 04.5 %   MCV 85.6 80.0 - 100.0 fL   MCH 28.2 26.0 - 34.0 pg   MCHC 32.9 30.0 - 36.0 g/dL   RDW 40.9 81.1 - 91.4 %   Platelets 402 (H) 150 - 400 K/uL   nRBC 0.0 0.0 - 0.2 %   Neutrophils Relative % 72 %   Neutro Abs 9.7 (H) 1.7 - 7.7 K/uL   Lymphocytes Relative 22 %   Lymphs Abs 2.9 0.7 - 4.0 K/uL   Monocytes Relative 5 %   Monocytes Absolute 0.7 0.1 - 1.0 K/uL   Eosinophils Relative 1 %   Eosinophils Absolute 0.1 0.0 - 0.5 K/uL   Basophils Relative 0 %   Basophils Absolute 0.1 0.0 - 0.1 K/uL   Immature Granulocytes 0 %   Abs Immature Granulocytes 0.05 0.00 - 0.07 K/uL  Comprehensive metabolic panel   Collection Time: 08/02/23  8:57 PM  Result Value Ref Range   Sodium 137 135 - 145 mmol/L   Potassium 3.5 3.5 - 5.1 mmol/L   Chloride 106 98 - 111 mmol/L   CO2 23 22 - 32 mmol/L   Glucose, Bld 88 70 - 99 mg/dL   BUN 7 6 - 20 mg/dL   Creatinine, Ser 7.82 0.44 - 1.00 mg/dL   Calcium  9.4 8.9 - 10.3 mg/dL   Total Protein 6.8 6.5 - 8.1 g/dL   Albumin 4.1 3.5 - 5.0 g/dL   AST 14 (L) 15 - 41 U/L   ALT 21 0 - 44 U/L   Alkaline Phosphatase 98 38 - 126 U/L   Total Bilirubin 0.6 0.0 - 1.2 mg/dL   GFR, Estimated >  60 >60 mL/min   Anion gap 8 5 -  15  hCG, serum, qualitative   Collection Time: 08/02/23  8:57 PM  Result Value Ref Range   Preg, Serum NEGATIVE NEGATIVE  Lipase, blood   Collection Time: 08/02/23  8:57 PM  Result Value Ref Range   Lipase 30 11 - 51 U/L  Urinalysis, w/ Reflex to Culture (Infection Suspected) -Urine, Clean Catch   Collection Time: 08/02/23  9:00 PM  Result Value Ref Range   Specimen Source URINE, CLEAN CATCH    Color, Urine YELLOW YELLOW   APPearance CLEAR CLEAR   Specific Gravity, Urine 1.016 1.005 - 1.030   pH 6.0 5.0 - 8.0   Glucose, UA NEGATIVE NEGATIVE mg/dL   Hgb urine dipstick NEGATIVE NEGATIVE   Bilirubin Urine NEGATIVE NEGATIVE   Ketones, ur NEGATIVE NEGATIVE mg/dL   Protein, ur NEGATIVE NEGATIVE mg/dL   Nitrite NEGATIVE NEGATIVE   Leukocytes,Ua NEGATIVE NEGATIVE   RBC / HPF 0-5 0 - 5 RBC/hpf   WBC, UA 0-5 0 - 5 WBC/hpf   Bacteria, UA RARE (A) NONE SEEN   Squamous Epithelial / HPF 0-5 0 - 5 /HPF   Mucus PRESENT    US  PELVIC COMPLETE W TRANSVAGINAL AND TORSION R/O Result Date: 08/03/2023 CLINICAL DATA:  Pelvic pain. EXAM: TRANSABDOMINAL AND TRANSVAGINAL ULTRASOUND OF PELVIS DOPPLER ULTRASOUND OF OVARIES TECHNIQUE: Both transabdominal and transvaginal ultrasound examinations of the pelvis were performed. Transabdominal technique was performed for global imaging of the pelvis including uterus, ovaries, adnexal regions, and pelvic cul-de-sac. It was necessary to proceed with endovaginal exam following the transabdominal exam to visualize the uterus, endometrium, bilateral ovaries and bilateral adnexa. Color and duplex Doppler ultrasound was utilized to evaluate blood flow to the ovaries. COMPARISON:  None Available. FINDINGS: Uterus Measurements: A 12.6 cm x 4.4 cm x 5.8 cm = volume: 170.0 mL. No fibroids or other mass visualized. Endometrium Thickness: 11.7 mm.  No focal abnormality visualized. Right ovary Measurements: 5.4 cm x 3.0 cm x 2.0 cm = volume: 16.44 mL. Normal appearance/no adnexal  mass. Left ovary Measurements: 5.0 cm x 4.0 cm x 4.8 cm = volume: N/A mL. A 4.0 cm x 3.0 cm x 3.1 cm simple left ovarian cyst is seen. No abnormal flow is noted within this region on color Doppler evaluation. Pulsed Doppler evaluation of both ovaries demonstrates normal low-resistance arterial and venous waveforms. Other findings No abnormal free fluid. IMPRESSION: 1. Large simple left ovarian cyst. Correlation with 6-12 week follow-up pelvic ultrasound is recommended to determine stability. Electronically Signed   By: Virgle Grime M.D.   On: 08/03/2023 01:41   CT Renal Stone Study Result Date: 08/02/2023 CLINICAL DATA:  Flank pain EXAM: CT ABDOMEN AND PELVIS WITHOUT CONTRAST TECHNIQUE: Multidetector CT imaging of the abdomen and pelvis was performed following the standard protocol without IV contrast. RADIATION DOSE REDUCTION: This exam was performed according to the departmental dose-optimization program which includes automated exposure control, adjustment of the mA and/or kV according to patient size and/or use of iterative reconstruction technique. COMPARISON:  None Available. FINDINGS: Lower chest: Lung bases demonstrate no acute airspace disease Hepatobiliary: No focal liver abnormality is seen. No gallstones, gallbladder wall thickening, or biliary dilatation. Pancreas: Unremarkable. No pancreatic ductal dilatation or surrounding inflammatory changes. Spleen: Normal in size without focal abnormality. Adrenals/Urinary Tract: Adrenal glands are normal. No hydronephrosis. The bladder is unremarkable Stomach/Bowel: The stomach is nonenlarged. There is no dilated small bowel. Negative appendix. No acute bowel wall thickening Vascular/Lymphatic: No  significant vascular findings are present. No enlarged abdominal or pelvic lymph nodes. Reproductive: Uterus unremarkable. Slight asymmetrical left adnexal enlargement with 4 cm cyst with slight increased internal density values. Other: Negative for pelvic  effusion or free air Musculoskeletal: No acute osseous abnormality IMPRESSION: 1. Negative for hydronephrosis or ureteral stone. 2. Mild asymmetrical left adnexal enlargement with 4 cm left adnexal cyst with slight increased internal density values, possible hemorrhagic cyst. This may be correlated with pelvic ultrasound. Electronically Signed   By: Esmeralda Hedge M.D.   On: 08/02/2023 23:06     Radiology US  PELVIC COMPLETE W TRANSVAGINAL AND TORSION R/O Result Date: 08/03/2023 CLINICAL DATA:  Pelvic pain. EXAM: TRANSABDOMINAL AND TRANSVAGINAL ULTRASOUND OF PELVIS DOPPLER ULTRASOUND OF OVARIES TECHNIQUE: Both transabdominal and transvaginal ultrasound examinations of the pelvis were performed. Transabdominal technique was performed for global imaging of the pelvis including uterus, ovaries, adnexal regions, and pelvic cul-de-sac. It was necessary to proceed with endovaginal exam following the transabdominal exam to visualize the uterus, endometrium, bilateral ovaries and bilateral adnexa. Color and duplex Doppler ultrasound was utilized to evaluate blood flow to the ovaries. COMPARISON:  None Available. FINDINGS: Uterus Measurements: A 12.6 cm x 4.4 cm x 5.8 cm = volume: 170.0 mL. No fibroids or other mass visualized. Endometrium Thickness: 11.7 mm.  No focal abnormality visualized. Right ovary Measurements: 5.4 cm x 3.0 cm x 2.0 cm = volume: 16.44 mL. Normal appearance/no adnexal mass. Left ovary Measurements: 5.0 cm x 4.0 cm x 4.8 cm = volume: N/A mL. A 4.0 cm x 3.0 cm x 3.1 cm simple left ovarian cyst is seen. No abnormal flow is noted within this region on color Doppler evaluation. Pulsed Doppler evaluation of both ovaries demonstrates normal low-resistance arterial and venous waveforms. Other findings No abnormal free fluid. IMPRESSION: 1. Large simple left ovarian cyst. Correlation with 6-12 week follow-up pelvic ultrasound is recommended to determine stability. Electronically Signed   By: Virgle Grime M.D.   On: 08/03/2023 01:41   CT Renal Stone Study Result Date: 08/02/2023 CLINICAL DATA:  Flank pain EXAM: CT ABDOMEN AND PELVIS WITHOUT CONTRAST TECHNIQUE: Multidetector CT imaging of the abdomen and pelvis was performed following the standard protocol without IV contrast. RADIATION DOSE REDUCTION: This exam was performed according to the departmental dose-optimization program which includes automated exposure control, adjustment of the mA and/or kV according to patient size and/or use of iterative reconstruction technique. COMPARISON:  None Available. FINDINGS: Lower chest: Lung bases demonstrate no acute airspace disease Hepatobiliary: No focal liver abnormality is seen. No gallstones, gallbladder wall thickening, or biliary dilatation. Pancreas: Unremarkable. No pancreatic ductal dilatation or surrounding inflammatory changes. Spleen: Normal in size without focal abnormality. Adrenals/Urinary Tract: Adrenal glands are normal. No hydronephrosis. The bladder is unremarkable Stomach/Bowel: The stomach is nonenlarged. There is no dilated small bowel. Negative appendix. No acute bowel wall thickening Vascular/Lymphatic: No significant vascular findings are present. No enlarged abdominal or pelvic lymph nodes. Reproductive: Uterus unremarkable. Slight asymmetrical left adnexal enlargement with 4 cm cyst with slight increased internal density values. Other: Negative for pelvic effusion or free air Musculoskeletal: No acute osseous abnormality IMPRESSION: 1. Negative for hydronephrosis or ureteral stone. 2. Mild asymmetrical left adnexal enlargement with 4 cm left adnexal cyst with slight increased internal density values, possible hemorrhagic cyst. This may be correlated with pelvic ultrasound. Electronically Signed   By: Esmeralda Hedge M.D.   On: 08/02/2023 23:06    Procedures Procedures    Medications Ordered in ED Medications  ketorolac  (  TORADOL ) injection 30 mg (has no administration in time  range)  oxyCODONE -acetaminophen  (PERCOCET/ROXICET) 5-325 MG per tablet 1 tablet (1 tablet Oral Given 08/02/23 1722)  ondansetron  (ZOFRAN -ODT) disintegrating tablet 4 mg (4 mg Oral Given 08/02/23 1722)    ED Course/ Medical Decision Making/ A&P                                 Medical Decision Making Sudden onset RLQ pain   Amount and/or Complexity of Data Reviewed Independent Historian: spouse    Details: See above  External Data Reviewed: notes.    Details: Previous notes reviewed  Labs: ordered.    Details: Urine is negative for UTI.  Negative pregnancy test. White count slight elevation 13.4, hemoglobin 13.3, elevated platelets 402. Normal creatinine 0.68  Radiology: ordered and independent interpretation performed.    Details: No stones by me on CT  Risk Prescription drug management. Risk Details: Well appearing.  No RLQ tenderness, no stones appendix is normal informed of need for follow up with GYN in 6 weeks for ovarian cysts.      Final Clinical Impression(s) / ED Diagnoses Final diagnoses:  Left ovarian cyst    No signs of systemic illness or infection. The patient is nontoxic-appearing on exam and vital signs are within normal limits.  I have reviewed the triage vital signs and the nursing notes. Pertinent labs & imaging results that were available during my care of the patient were reviewed by me and considered in my medical decision making (see chart for details). After history, exam, and medical workup I feel the patient has been appropriately medically screened and is safe for discharge home. Pertinent diagnoses were discussed with the patient. Patient was given return precautions.    Rx / DC Orders ED Discharge Orders          Ordered    naproxen (NAPROSYN) 500 MG tablet  2 times daily        08/03/23 0212              Sakari Raisanen, MD 08/03/23 1610

## 2023-08-05 IMAGING — US US MFM OB TRANSVAGINAL
1 series · 15 of 28 positions shown · non-contrast
Comparison: none

[Series 1: us mfm ob transvaginal · 15 of 41 slices shown]
[im 1/41]
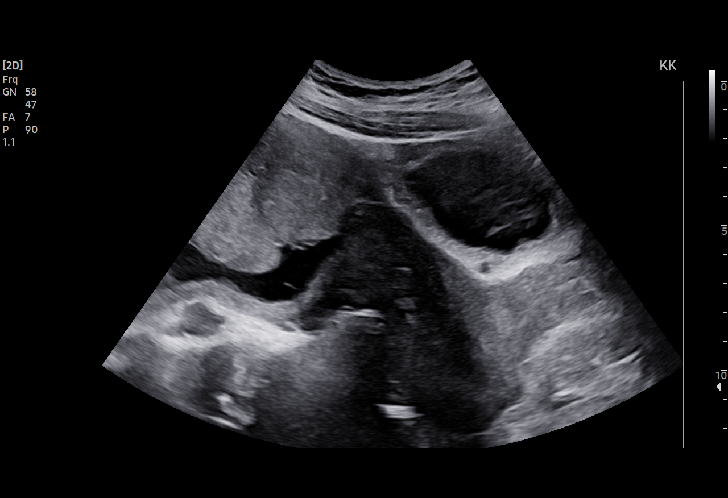
[im 3/41]
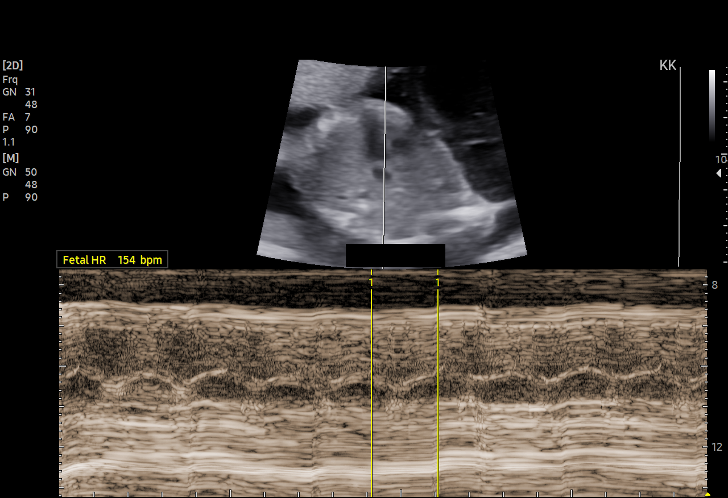
[im 6/41]
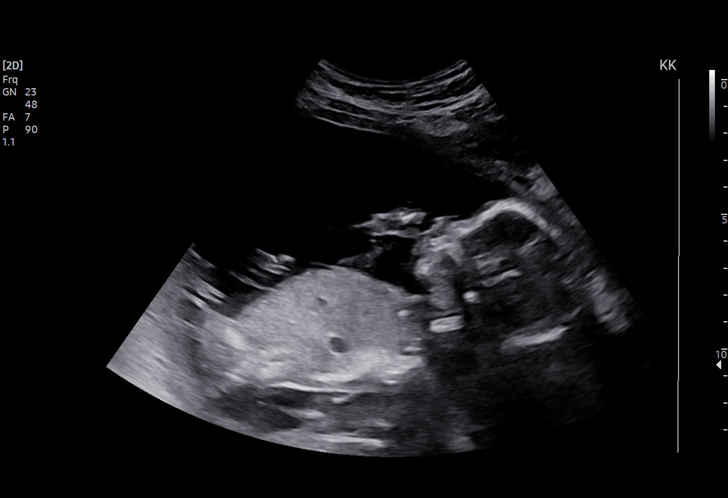
[im 9/41]
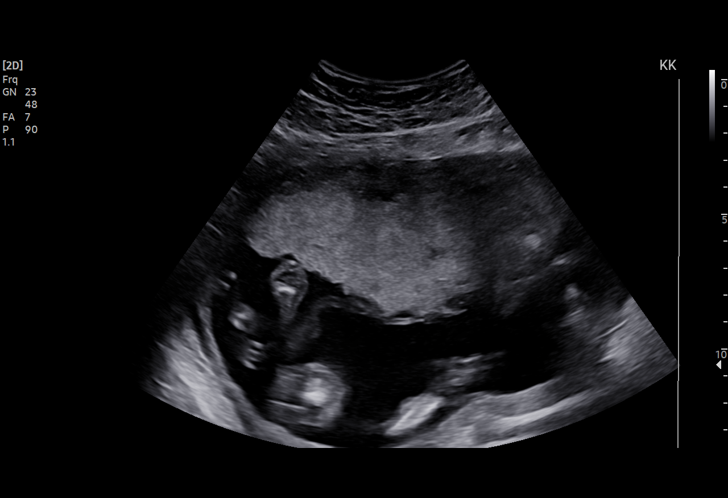
[im 12/41]
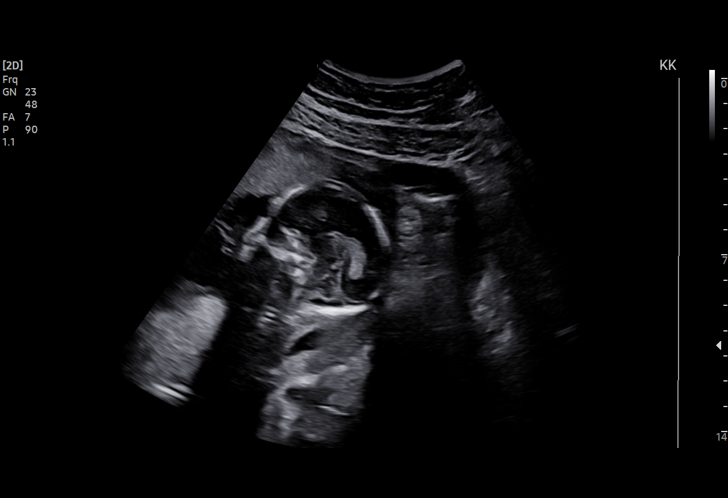
[im 15/41]
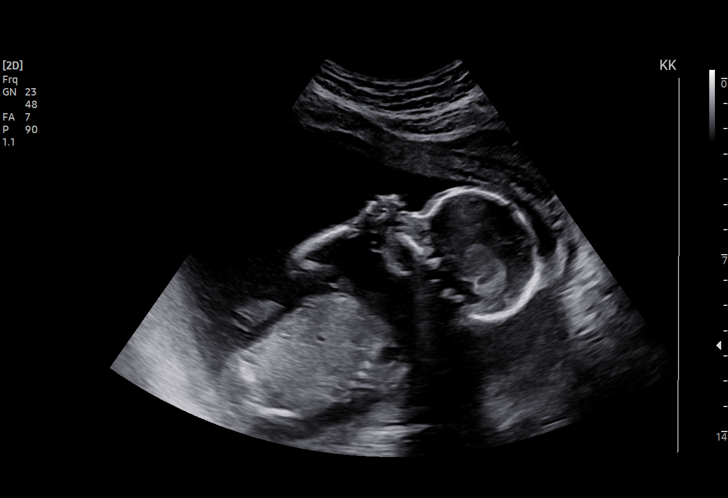
[im 18/41]
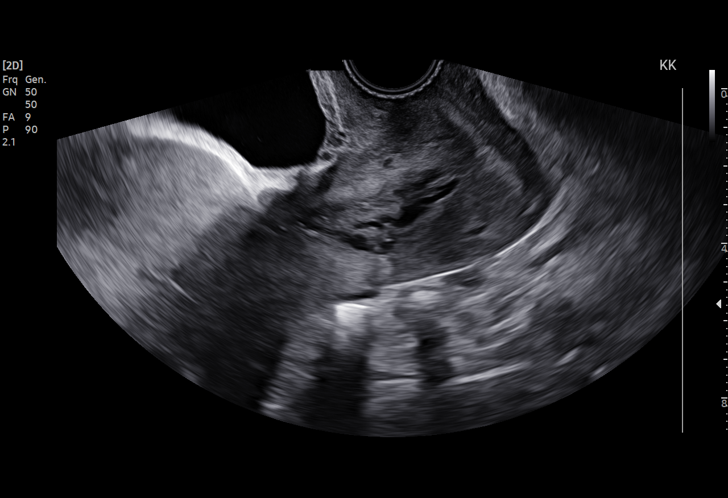
[im 21/41]
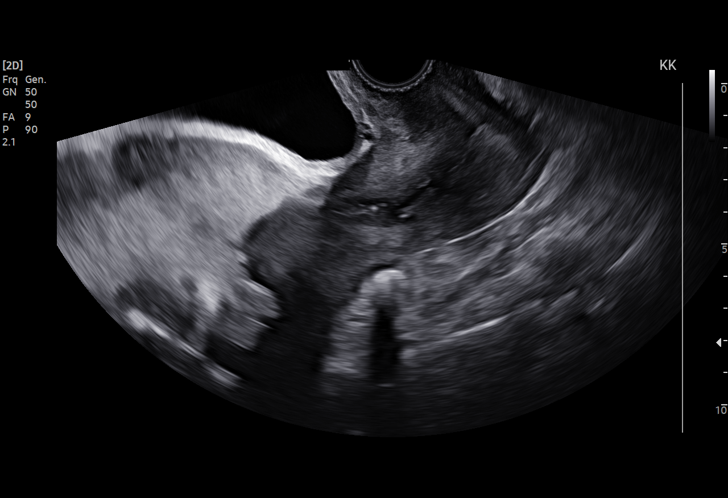
[im 23/41]
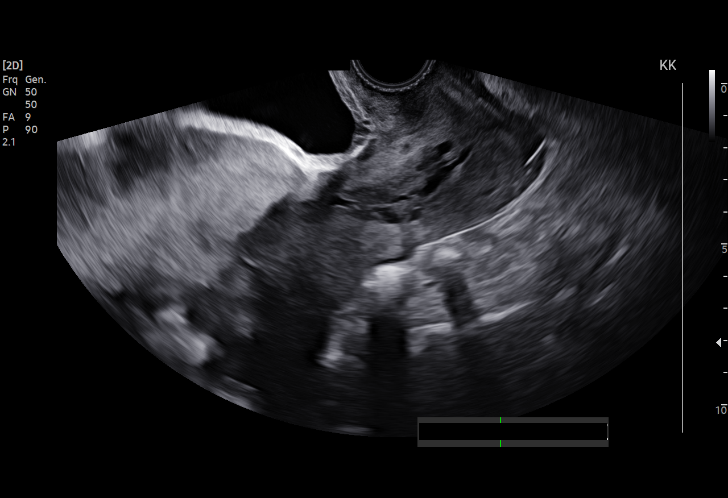
[im 26/41]
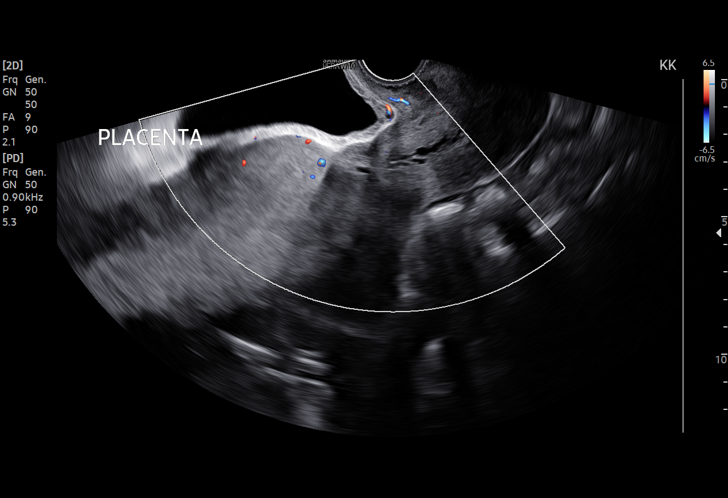
[im 29/41]
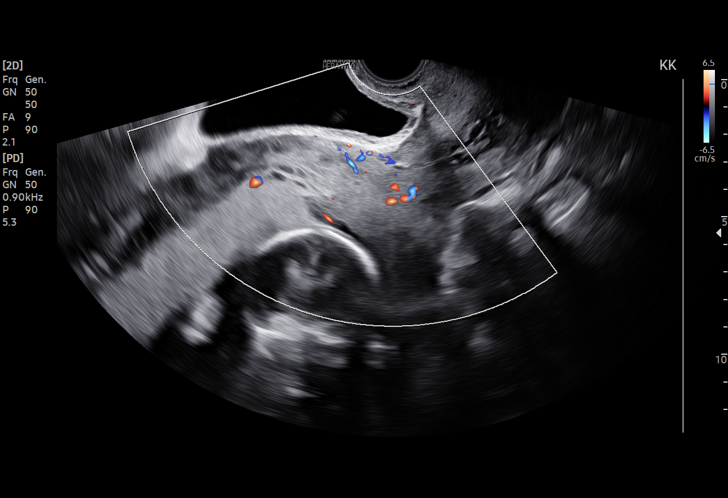
[im 32/41]
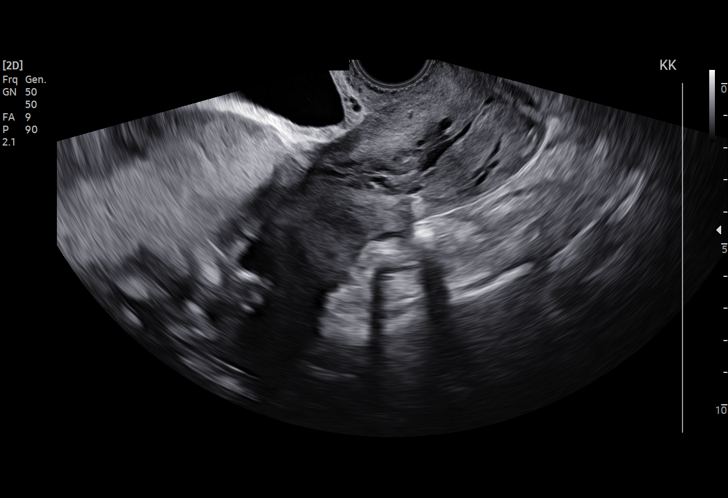
[im 35/41]
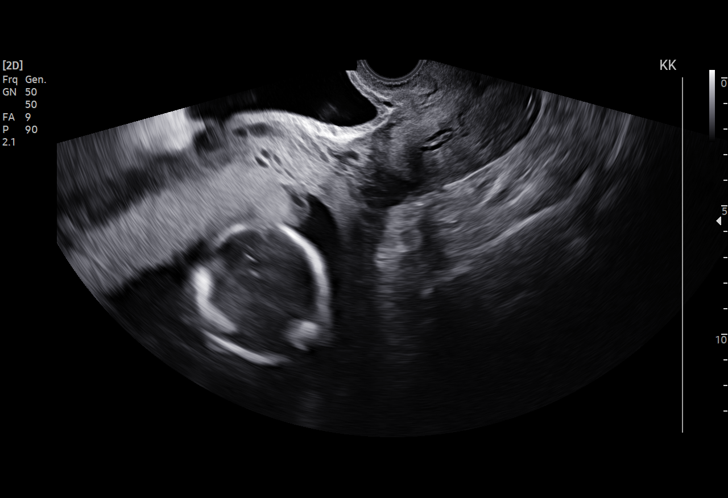
[im 38/41]
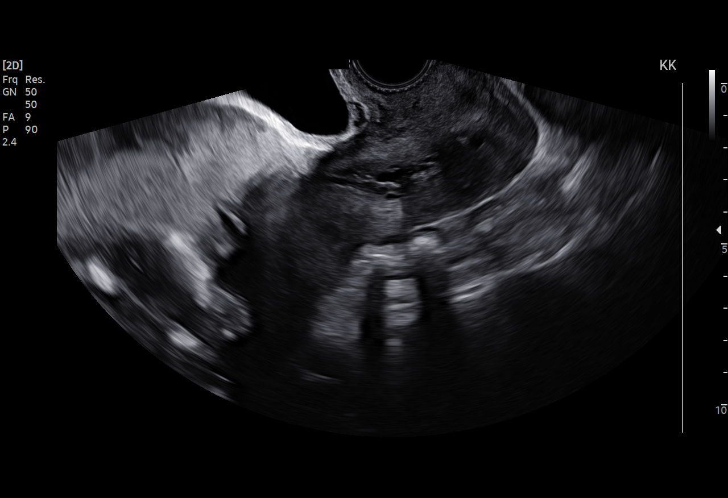
[im 41/41]
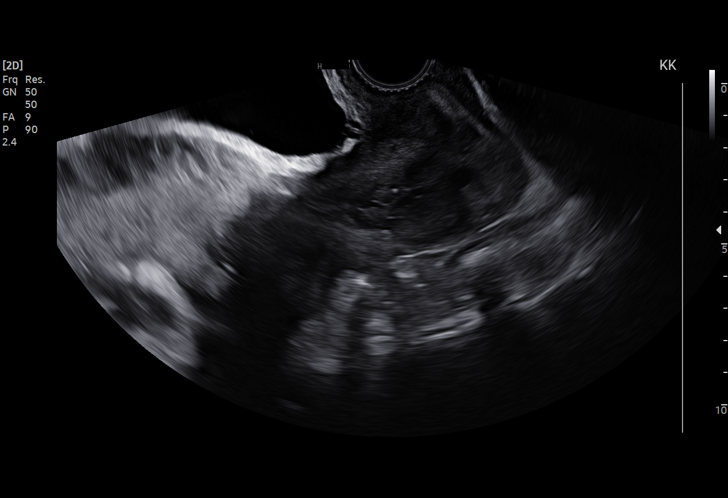

[15 of 28 positions shown; findings below may reference images not displayed]

Indications

 Cervical incompetence, second trimester
 (cerclage x 2)
 Hypertension - Chronic/Pre-existing
 (nifedipine)
 Encounter for cervical length
 Poor obstetric history: Previous preeclampsia
 Previous cesarean delivery, antepartum
 (VBAC since)
 Poor obstetric history: Previous preterm
 delivery, antepartum (30 and 34 weeks)
 Medical complication of pregnancy (hx PE -
 on Lovenox)
 LR NIPS
 Genetic carrier ( Sickle Cell Trait)
 20 weeks gestation of pregnancy
 Tobacco use complicating pregnancy,
 second trimester
Fetal Evaluation

 Num Of Fetuses:         1
 Fetal Heart Rate(bpm):  154
 Cardiac Activity:       Observed
 Presentation:           Cephalic
 Placenta:               Anterior
 P. Cord Insertion:      Previously Visualized
 Amniotic Fluid
 AFI FV:      Within normal limits

                             Largest Pocket(cm)

OB History

 Gravidity:    11        Term:   0        Prem:   2        SAB:   7
 TOP:          1       Ectopic:  0        Living: 2
Gestational Age

 LMP:           23w 2d        Date:  03/22/21                  EDD:   12/27/21
 Best:          20w 1d     Det. By:  Early Ultrasound         EDD:   01/18/22
                                     (06/30/21)
Cervix Uterus Adnexa

 Cervix
 Length:            5.4  cm.
 Measured transvaginally.
Impression

 Patient with history of preterm deliveries to return for cervical
 length measurement.  She had opted for serial cervical
 measurements.
 Amniotic fluid is normal and good fetal activity seen.  On
 transvaginal ultrasound, the cervix looks long and closed (5.4
 cm).  Because of lower uterine contractions, the internal os
 could not be clearly visualized.
 Patient does not have symptoms of vaginal bleeding or pelvic
 pressure.
Recommendations

 -Fetal growth and cervical length measurement in 2 weeks.
                Ron[place], Ingunn Harpa

## 2023-09-13 ENCOUNTER — Ambulatory Visit: Payer: Self-pay | Admitting: Emergency Medicine

## 2023-09-13 ENCOUNTER — Ambulatory Visit

## 2023-09-13 ENCOUNTER — Ambulatory Visit
Admission: RE | Admit: 2023-09-13 | Discharge: 2023-09-13 | Disposition: A | Discharge status: Home / Self Care | Source: Ambulatory Visit | Attending: Emergency Medicine

## 2023-09-13 VITALS — BP 113/85 | HR 58 | Temp 98.1°F | Resp 16

## 2023-09-13 DIAGNOSIS — R062 Wheezing: Secondary | ICD-10-CM | POA: Diagnosis not present

## 2023-09-13 DIAGNOSIS — J209 Acute bronchitis, unspecified: Secondary | ICD-10-CM | POA: Diagnosis not present

## 2023-09-13 MED ORDER — AZITHROMYCIN 250 MG PO TABS: 250.0000 mg | ORAL_TABLET | Freq: Every day | ORAL | 0 refills | Status: AC

## 2023-09-13 MED ORDER — BENZONATATE 200 MG PO CAPS: 200.0000 mg | ORAL_CAPSULE | Freq: Three times a day (TID) | ORAL | 0 refills | Status: AC | PRN

## 2023-09-13 MED ORDER — PREDNISONE 20 MG PO TABS: 40.0000 mg | ORAL_TABLET | Freq: Every day | ORAL | 0 refills | Status: AC

## 2023-09-13 NOTE — ED Triage Notes (Signed)
 Pt states SOB for the past 5 days.  States she has a pain in her chest and back when she takes a deep breath. States she is unable to smoke so she knows that something is wrong.  States last time she felt like this she had pneumonia.

## 2023-09-13 NOTE — ED Provider Notes (Signed)
 [facility]    CSN: [id number] Arrival date & time: 09/13/23  1839      History   Chief Complaint Chief Complaint  Patient presents with   Wheezing    Chest pain and back pain for 3 days. Needing to use rescue inhaler. - Entered by patient    HPI Kimberly Stark is a 33 y.o. female.   Patient presents to clinic for concern of wheezing, shortness of breath and central chest pain that radiates into her back that has been ongoing for the past 5 days.  Will feel a pain in her chest whenever she takes a deep breath.  Has had a cough that is productive with green phlegm.  Symptoms have been ongoing for the past 6 days.  She will sometimes smoke half a pack per day, symptoms were not smoke at all, depends on her stress level.  Has noticed that during this illness she has not been able to smoke.  Has been taking Tylenol  and ibuprofen  and had a few Tessalon  Perles leftover that helped with her cough.  Has not had any fevers.  Does have a history of asthma.  The history is provided by the patient and medical records.  Wheezing   Past Medical History:  Diagnosis Date   Anemia    Asthma    Bipolar 1 disorder (HCC)    Blood transfusion without reported diagnosis    Chiari malformation type I (HCC)    Chronic post-traumatic stress disorder 09/19/2021   Glaucoma 09/19/2021   Heart murmur    History of postpartum hemorrhage    History of pulmonary embolism 2021   in setting of covid   Insomnia 09/19/2021   Migraine with typical aura 09/19/2021   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/2021   Pregnancy induced hypertension    Preterm labor    Sickle cell trait (HCC)    UTI (urinary tract infection)    Vaginal Pap smear, abnormal     Patient Active Problem List   Diagnosis Date Noted   History of bilateral tubal ligation 02/16/2023   Chronic hypertension 02/14/2023   History of gestational diabetes 01/03/2023   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/13/2021   History of  LEEP (loop electrosurgical excision procedure) of cervix complicating pregnancy in third trimester 11/21/2021   BMI 38.0-38.9,adult 11/21/2021   Post-traumatic stress disorder, chronic 09/19/2021   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   History of pulmonary embolus (PE) 06/22/2021   Tobacco abuse 06/22/2021    Past Surgical History:  Procedure Laterality Date   BILATERAL SALPINGECTOMY Bilateral 02/16/2023   At the time of her c-section   CERVICAL BIOPSY  W/ LOOP ELECTRODE EXCISION     CERVICAL CERCLAGE     CESAREAN SECTION     CESAREAN SECTION N/A 12/30/2021   Procedure: CESAREAN SECTION;  Surgeon: Lorence Ozell CROME, MD;  Location: [facility];  Service: Obstetrics;  Laterality: N/A;   CESAREAN SECTION N/A 02/16/2023   Procedure: CESAREAN SECTION;  Surgeon: Cleatus Moccasin, MD;  Location: [facility];  Service: Obstetrics;  Laterality: N/A;   DILATION AND CURETTAGE OF UTERUS     TUBAL LIGATION Bilateral 02/16/2023   Procedure: BILATERAL TUBAL LIGATION;  Surgeon: Cleatus Moccasin, MD;  Location: [facility];  Service: Obstetrics;  Laterality: Bilateral;    OB History     Gravida  12   Para  4   Term  2   Preterm  2   AB  8   Living  4      SAB  7   IAB  1   Ectopic  0   Multiple  0   Live Births  4        Obstetric Comments  Pre-E with both preg, induced with both          Home Medications    Prior to Admission medications   Medication Sig Start Date End Date Taking? Authorizing Provider  azithromycin  (ZITHROMAX ) 250 MG tablet Take 1 tablet (250 mg total) by mouth daily. Take first 2 tablets together, then 1 every day until finished. 09/13/23  Yes Shaylah Mcghie  N, FNP  benzonatate  (TESSALON ) 200 MG capsule Take 1 capsule (200 mg total) by mouth 3 (three) times daily as needed for cough. 09/13/23  Yes Lanitra Battaglini  N, FNP  predniSONE  (DELTASONE ) 20 MG tablet Take 2 tablets (40 mg total) by mouth daily with breakfast for 5 days. 09/13/23 09/18/23 Yes Ayani Ospina   N, FNP  albuterol  (VENTOLIN  HFA) 108 (90 Base) MCG/ACT inhaler Inhale 1-2 puffs into the lungs every 6 (six) hours as needed for wheezing or shortness of breath.    [provider]  amLODipine  (NORVASC ) 10 MG tablet Take 1 tablet (10 mg total) by mouth daily. 02/21/23   Zina Jerilynn LABOR, MD  labetalol  (NORMODYNE ) 200 MG tablet Take 3 tablets (600 mg total) by mouth in the morning, at noon, and at bedtime. 02/28/23   Anyanwu, Ugonna A, MD  naproxen  (NAPROSYN ) 500 MG tablet Take 1 tablet (500 mg total) by mouth 2 (two) times daily. 08/03/23   Palumbo, April, MD  pantoprazole  (PROTONIX ) 40 MG tablet Take 1 tablet (40 mg total) by mouth daily. 01/02/23 02/13/23  Lola Donnice HERO, MD    Family History Family History  Problem Relation Age of Onset   Stroke Mother    Heart disease Mother        hx open heart issues   Hypertension Mother    Diabetes Mother    Cancer Father        started as colon, w/mets   Kidney disease Father        renal failure   Diabetes Father    Cancer Maternal Grandmother    Diabetes Paternal Grandmother     Social History Social History   Tobacco Use   Smoking status: Some Days    Current packs/day: 0.25    Types: Cigarettes   Smokeless tobacco: Never  Vaping Use   Vaping status: Former   Substances: Nicotine  Substance Use Topics   Alcohol  use: Not Currently   Drug use: Not Currently     Allergies   Iodinated contrast media, Latex, Shellfish allergy, Iodine , and Penicillins   Review of Systems Review of Systems  Per HPI  Physical Exam Triage Vital Signs ED Triage Vitals  Encounter Vitals Group     BP 09/13/23 1848 113/85     Girls Systolic BP Percentile --      Girls Diastolic BP Percentile --      Boys Systolic BP Percentile --      Boys Diastolic BP Percentile --      Pulse Rate 09/13/23 1848 (!) 58     Resp 09/13/23 1848 16     Temp 09/13/23 1848 98.1 F (36.7 C)     Temp Source 09/13/23 1848 Oral     SpO2 09/13/23 1848  98 %     Weight --      Height --  Head Circumference --      Peak Flow --      Pain Score 09/13/23 1850 7     Pain Loc --      Pain Education --      Exclude from Growth Chart --    No data found.  Updated Vital Signs BP 113/85 (BP Location: Left Arm)   Pulse (!) 58   Temp 98.1 F (36.7 C) (Oral)   Resp 16   LMP 09/01/2023 (Exact Date)   SpO2 98%   Breastfeeding No   Visual Acuity Right Eye Distance:   Left Eye Distance:   Bilateral Distance:    Right Eye Near:   Left Eye Near:    Bilateral Near:     Physical Exam Vitals and nursing note reviewed.  Constitutional:      Appearance: Normal appearance.  HENT:     Head: Normocephalic and atraumatic.     Right Ear: External ear normal.     Left Ear: External ear normal.     Nose: Nose normal.     Mouth/Throat:     Mouth: Mucous membranes are moist.  Eyes:     Conjunctiva/sclera: Conjunctivae normal.  Cardiovascular:     Rate and Rhythm: Normal rate and regular rhythm.     Heart sounds: Normal heart sounds. No murmur heard. Pulmonary:     Effort: Pulmonary effort is normal. No respiratory distress.     Breath sounds: Normal breath sounds.  Neurological:     General: No focal deficit present.     Mental Status: She is alert.  Psychiatric:        Mood and Affect: Mood normal.      UC Treatments / Results  Labs (all labs ordered are listed, but only abnormal results are displayed) Labs Reviewed - No data to display  EKG   Radiology No results found.  Procedures Procedures (including critical care time)  Medications Ordered in UC Medications - No data to display  Initial Impression / Assessment and Plan / UC Course  I have reviewed the triage vital signs and the nursing notes.  Pertinent labs & imaging results that were available during my care of the patient were reviewed by me and considered in my medical decision making (see chart for details).  Vitals in triage reviewed, patient is  hemodynamically stable.  Lungs vesicular, heart with regular rate and rhythm.  EKG shows normal sinus rhythm without ST elevation or ST depression.  Imaging by my interpretation does not show any acute cardiopulmonary abnormality, no evidence of pneumonia.  Suspicious for bronchitis, will treat with prednisone , azithromycin  and Tessalon .  Plan of care, follow-up care return precautions given, no questions at this time.     Final Clinical Impressions(s) / UC Diagnoses   Final diagnoses:  Wheezing  Acute bronchitis, unspecified organism     Discharge Instructions      I do not see any obvious pneumonia.  We are treating you with bronchitis, start the steroids tomorrow with breakfast.  He can take the Tessalon  Perles up to 3 times daily to help with cough.  Start the antibiotics today and take them as prescribed with food.  Ensure you are staying well-hydrated with at least 64 ounces of water  daily.  You can continue to take ibuprofen  and Tylenol  as needed for any headache or pain.  Symptoms should improve over the next few days, if no improvement by Sunday you can return to clinic for reevaluation.    ED  Prescriptions     Medication Sig Dispense Auth. Provider   predniSONE  (DELTASONE ) 20 MG tablet Take 2 tablets (40 mg total) by mouth daily with breakfast for 5 days. 10 tablet Dreama, Jameela Michna  N, FNP   azithromycin  (ZITHROMAX ) 250 MG tablet Take 1 tablet (250 mg total) by mouth daily. Take first 2 tablets together, then 1 every day until finished. 6 tablet Dreama, Donnetta Gillin  N, FNP   benzonatate  (TESSALON ) 200 MG capsule Take 1 capsule (200 mg total) by mouth 3 (three) times daily as needed for cough. 30 capsule Dreama, Aracelys Glade  N, FNP      PDMP not reviewed this encounter.   Dreama Merica Prell  N, FNP 09/13/23 1934

## 2023-09-13 NOTE — Discharge Instructions (Signed)
 I do not see any obvious pneumonia.  We are treating you with bronchitis, start the steroids tomorrow with breakfast.  He can take the Tessalon  Perles up to 3 times daily to help with cough.  Start the antibiotics today and take them as prescribed with food.  Ensure you are staying well-hydrated with at least 64 ounces of water  daily.  You can continue to take ibuprofen  and Tylenol  as needed for any headache or pain.  Symptoms should improve over the next few days, if no improvement by Sunday you can return to clinic for reevaluation.

## 2024-02-24 ENCOUNTER — Emergency Department (HOSPITAL_COMMUNITY)
Admission: EM | Admit: 2024-02-24 | Discharge: 2024-02-25 | Disposition: A | Discharge status: Home / Self Care | Attending: Emergency Medicine | Admitting: Emergency Medicine

## 2024-02-24 ENCOUNTER — Other Ambulatory Visit: Payer: Self-pay

## 2024-02-24 ENCOUNTER — Encounter (HOSPITAL_COMMUNITY): Payer: Self-pay | Admitting: Emergency Medicine

## 2024-02-24 DIAGNOSIS — R0789 Other chest pain: Secondary | ICD-10-CM | POA: Diagnosis not present

## 2024-02-24 DIAGNOSIS — Z91040 Latex allergy status: Secondary | ICD-10-CM | POA: Diagnosis not present

## 2024-02-24 DIAGNOSIS — R519 Headache, unspecified: Secondary | ICD-10-CM | POA: Diagnosis not present

## 2024-02-24 DIAGNOSIS — R0602 Shortness of breath: Secondary | ICD-10-CM | POA: Diagnosis not present

## 2024-02-24 DIAGNOSIS — R079 Chest pain, unspecified: Secondary | ICD-10-CM | POA: Diagnosis present

## 2024-02-24 DIAGNOSIS — R059 Cough, unspecified: Secondary | ICD-10-CM | POA: Diagnosis not present

## 2024-02-24 NOTE — ED Provider Triage Note (Signed)
 Emergency Medicine Provider Triage Evaluation Note  Kimberly Stark , a 33 y.o. female  was evaluated in triage.  Pt complains of chest pain that started around 8pm.  Admits to feels poorly today.  Has had a cough x2 days but denies fever pr sick contacts.  Review of Systems  Positive: Chest pain, cough Negative: fever  Physical Exam  BP (!) 158/92 (BP Location: Right Arm)   Pulse 70   Temp 98.5 F (36.9 C) (Oral)   Resp 16   Ht 5' 4 (1.626 m)   Wt 86.2 kg   SpO2 100%   BMI 32.62 kg/m  Gen:   Awake, no distress   Resp:  Normal effort  MSK:   Moves extremities without difficulty  Other:    Medical Decision Making  Medically screening exam initiated at 11:38 PM.  Appropriate orders placed.  Kimberly Stark was informed that the remainder of the evaluation will be completed by another provider, this initial triage assessment does not replace that evaluation, and the importance of remaining in the ED until their evaluation is complete.  Chest pain, cough.  EKG, labs, CXR, RVP.   Kimberly Olam HERO, PA-C 02/24/24 2346

## 2024-02-24 NOTE — ED Triage Notes (Addendum)
 Pt bib ems from home with c/o neck pain that radiates to her chest with shob and fatigue. Cardiac hx  324 asa  170/80 74HR

## 2024-02-25 ENCOUNTER — Emergency Department (HOSPITAL_COMMUNITY)

## 2024-02-25 LAB — RESP PANEL BY RT-PCR (RSV, FLU A&B, COVID)  RVPGX2
Influenza A by PCR: NEGATIVE
Influenza B by PCR: NEGATIVE
Resp Syncytial Virus by PCR: NEGATIVE
SARS Coronavirus 2 by RT PCR: NEGATIVE

## 2024-02-25 LAB — BASIC METABOLIC PANEL WITH GFR
Anion gap: 11 (ref 5–15)
BUN: 5 mg/dL — ABNORMAL LOW (ref 6–20)
CO2: 24 mmol/L (ref 22–32)
Calcium: 9.2 mg/dL (ref 8.9–10.3)
Chloride: 105 mmol/L (ref 98–111)
Creatinine, Ser: 0.7 mg/dL (ref 0.44–1.00)
GFR, Estimated: >60 mL/min (ref >60)
Glucose, Bld: 101 mg/dL — ABNORMAL HIGH (ref 70–99)
Potassium: 3.2 mmol/L — ABNORMAL LOW (ref 3.5–5.1)
Sodium: 140 mmol/L (ref 135–145)

## 2024-02-25 LAB — CBC
HCT: 42.1 % (ref 36.0–46.0)
Hemoglobin: 14.1 g/dL (ref 12.0–15.0)
MCH: 29.6 pg (ref 26.0–34.0)
MCHC: 33.5 g/dL (ref 30.0–36.0)
MCV: 88.3 fL (ref 80.0–100.0)
Platelets: 349 K/uL (ref 150–400)
RBC: 4.77 MIL/uL (ref 3.87–5.11)
RDW: 12.8 % (ref 11.5–15.5)
WBC: 8.1 K/uL (ref 4.0–10.5)
nRBC: 0 % (ref 0.0–0.2)

## 2024-02-25 LAB — TROPONIN I (HIGH SENSITIVITY)
Troponin I (High Sensitivity): 3 ng/L (ref <18)
Troponin I (High Sensitivity): 4 ng/L (ref <18)

## 2024-02-25 LAB — HCG, SERUM, QUALITATIVE: Preg, Serum: NEGATIVE

## 2024-02-25 LAB — D-DIMER, QUANTITATIVE: D-Dimer, Quant: <0.27 ug{FEU}/mL (ref 0.00–0.50)

## 2024-02-25 MED ORDER — KETOROLAC TROMETHAMINE 15 MG/ML IJ SOLN
15.0000 mg | Freq: Once | INTRAMUSCULAR | Status: AC
  Administered 2024-02-25: 06:00:00 15 mg via INTRAVENOUS
  Filled 2024-02-25: qty 1

## 2024-02-25 NOTE — ED Provider Notes (Signed)
  EMERGENCY DEPARTMENT AT [facility] Provider Note   CSN: [id number] Arrival date & time: 02/24/24  2330     Patient presents with: Chest Pain   Kimberly Stark is a 33 y.o. female.   The history is provided by the patient.  Chest Pain Kimberly Stark is a 33 y.o. female who presents to the Emergency Department complaining of chest pain.  She presents to the emergency department by EMS for evaluation of chest pain that started around 730.  Pain is described as a sharp and squeezing sensation that feels like it is around her heart.  It is worse with moving, talking and breathing.  She does have associated shortness of breath and nonproductive cough.  She also has pain that radiates down her right neck.  No fever, runny nose, abdominal pain, leg swelling.  She does report that she has felt unwell today and has a slight headache that is migratory as well.  No known sick contacts.  She has a history of pulmonary embolism and pregnancy, preeclampsia, gestational hypertension.  She is not currently on any medications.  No oral contraceptives.  She is particularly concerned because her mother had similar symptoms and died of constrictive pericarditis.      Prior to Admission medications  Medication Sig Start Date End Date Taking? Authorizing Provider  albuterol  (VENTOLIN  HFA) 108 (90 Base) MCG/ACT inhaler Inhale 1-2 puffs into the lungs every 6 (six) hours as needed for wheezing or shortness of breath.    [provider]  amLODipine  (NORVASC ) 10 MG tablet Take 1 tablet (10 mg total) by mouth daily. 02/21/23   Zina Jerilynn LABOR, MD  azithromycin  (ZITHROMAX ) 250 MG tablet Take 1 tablet (250 mg total) by mouth daily. Take first 2 tablets together, then 1 every day until finished. 09/13/23   Dreama, [place]  N, FNP  benzonatate  (TESSALON ) 200 MG capsule Take 1 capsule (200 mg total) by mouth 3 (three) times daily as needed for cough. 09/13/23   Dreama, [place]  N, FNP   labetalol  (NORMODYNE ) 200 MG tablet Take 3 tablets (600 mg total) by mouth in the morning, at noon, and at bedtime. 02/28/23   Anyanwu, Ugonna A, MD  naproxen  (NAPROSYN ) 500 MG tablet Take 1 tablet (500 mg total) by mouth 2 (two) times daily. 08/03/23   Palumbo, April, MD  pantoprazole  (PROTONIX ) 40 MG tablet Take 1 tablet (40 mg total) by mouth daily. 01/02/23 02/13/23  Lola Donnice HERO, MD    Allergies: Iodinated contrast media, Latex, Shellfish allergy, Iodine , and Penicillins    Review of Systems  Cardiovascular:  Positive for chest pain.  All other systems reviewed and are negative.   Updated Vital Signs BP (!) 153/92 (BP Location: Right Wrist)   Pulse (!) 57   Temp 98.2 F (36.8 C)   Resp 18   Ht 5' 4 (1.626 m)   Wt 86.2 kg   SpO2 100%   BMI 32.62 kg/m   Physical Exam Vitals and nursing note reviewed.  Constitutional:      Appearance: She is well-developed.  HENT:     Head: Normocephalic and atraumatic.  Cardiovascular:     Rate and Rhythm: Normal rate and regular rhythm.     Heart sounds: No murmur heard. Pulmonary:     Effort: Pulmonary effort is normal. No respiratory distress.     Breath sounds: Normal breath sounds.  Abdominal:     Palpations: Abdomen is soft.     Tenderness: There is no abdominal tenderness.  There is no guarding or rebound.  Musculoskeletal:        General: No swelling or tenderness.  Skin:    General: Skin is warm and dry.  Neurological:     Mental Status: She is alert and oriented to person, place, and time.  Psychiatric:        Behavior: Behavior normal.     (all labs ordered are listed, but only abnormal results are displayed) Labs Reviewed  BASIC METABOLIC PANEL WITH GFR - Abnormal; Notable for the following components:      Result Value   Potassium 3.2 (*)    Glucose, Bld 101 (*)    BUN 5 (*)    All other components within normal limits  RESP PANEL BY RT-PCR (RSV, FLU A&B, COVID)  RVPGX2  CBC  HCG, SERUM, QUALITATIVE   D-DIMER, QUANTITATIVE  TROPONIN I (HIGH SENSITIVITY)  TROPONIN I (HIGH SENSITIVITY)    EKG: EKG Interpretation Date/Time:  Sunday February 24 2024 23:40:23 EST Ventricular Rate:  72 PR Interval:  162 QRS Duration:  84 QT Interval:  440 QTC Calculation: 481 R Axis:   92  Text Interpretation: Normal sinus rhythm Rightward axis Septal infarct , age undetermined T wave abnormality, consider inferior ischemia T wave abnormality, consider anterolateral ischemia Abnormal ECG Confirmed by Griselda Norris [phone]) on 02/25/2024 4:01:43 AM  Radiology: DG Chest 2 View Result Date: 02/25/2024 EXAM: 2 VIEW(S) XRAY OF THE CHEST 02/25/2024 12:30:00 AM COMPARISON: 09/13/2023 CLINICAL HISTORY: chest pain FINDINGS: LUNGS AND PLEURA: No focal pulmonary opacity. No pleural effusion. No pneumothorax. HEART AND MEDIASTINUM: No acute abnormality of the cardiac and mediastinal silhouettes. BONES AND SOFT TISSUES: No acute osseous abnormality. IMPRESSION: 1. No acute cardiopulmonary pathology. Electronically signed by: Pinkie Pebbles MD 02/25/2024 12:32 AM EST RP Workstation: [id number]     Procedures   Medications Ordered in the ED  ketorolac  (TORADOL ) 15 MG/ML injection 15 mg (has no administration in time range)                                    Medical Decision Making Amount and/or Complexity of Data Reviewed Labs: ordered. Radiology: ordered.  Risk Prescription drug management.   Patient with history of PE in pregnancy, gestational hypertension here for evaluation of chest pain, headache.  She is nontoxic-appearing on evaluation with no focal neurologic deficits.  EKG is abnormal but similar when compared to priors in the system.  Troponins are negative x 2.  D-dimer is negative, she is overall low risk for PE.  Current picture is not consistent with PE, ACS, dissection, hypertensive urgency..  Discussed with patient home care for headache, chest pain.  Suspect she may have a viral process  driving symptoms.  She is negative for COVID and flu.  Picture is not consistent with myocarditis, pericarditis.  Feel she is stable for discharge home with PCP follow-up and return precautions.     Final diagnoses:  Atypical chest pain    ED Discharge Orders     None          Griselda Norris, MD 02/25/24 0600
